# Patient Record
Sex: Male | Born: 1950
Health system: Southern US, Community
[De-identification: ages and names within clinical notes are randomized; demographics above are authoritative.]

## PROBLEM LIST (undated history)

## (undated) DIAGNOSIS — K219 Gastro-esophageal reflux disease without esophagitis: Secondary | ICD-10-CM

## (undated) DIAGNOSIS — E785 Hyperlipidemia, unspecified: Secondary | ICD-10-CM

## (undated) DIAGNOSIS — M1990 Unspecified osteoarthritis, unspecified site: Secondary | ICD-10-CM

## (undated) DIAGNOSIS — Z87442 Personal history of urinary calculi: Secondary | ICD-10-CM

## (undated) DIAGNOSIS — J302 Other seasonal allergic rhinitis: Secondary | ICD-10-CM

## (undated) DIAGNOSIS — Z973 Presence of spectacles and contact lenses: Secondary | ICD-10-CM

## (undated) DIAGNOSIS — I10 Essential (primary) hypertension: Secondary | ICD-10-CM

## (undated) HISTORY — PX: CARPAL TUNNEL RELEASE: SHX101

## (undated) HISTORY — PX: INGUINAL HERNIA REPAIR: SHX194

## (undated) HISTORY — DX: Morbid (severe) obesity due to excess calories: E66.01

## (undated) HISTORY — PX: CYST EXCISION: SHX5701

## (undated) HISTORY — PX: COLONOSCOPY: SHX174

---

## 1998-11-30 HISTORY — PX: NASAL SINUS SURGERY: SHX719

## 2010-02-03 ENCOUNTER — Encounter: Admission: RE | Admit: 2010-02-03 | Discharge: 2010-02-03 | Payer: Self-pay | Admitting: Family Medicine

## 2015-02-05 ENCOUNTER — Encounter (HOSPITAL_BASED_OUTPATIENT_CLINIC_OR_DEPARTMENT_OTHER): Payer: Self-pay | Admitting: *Deleted

## 2015-02-05 NOTE — Progress Notes (Signed)
   02/05/15 1620  OBSTRUCTIVE SLEEP APNEA  Have you ever been diagnosed with sleep apnea through a sleep study? No  Do you snore loudly (loud enough to be heard through closed doors)?  1  Do you often feel tired, fatigued, or sleepy during the daytime? 0  Has anyone observed you stop breathing during your sleep? 0  Do you have, or are you being treated for high blood pressure? 1  BMI more than 35 kg/m2? 1  Age over 63 years old? 1  Neck circumference greater than 40 cm/16 inches? 1  Gender: 1

## 2015-02-05 NOTE — Progress Notes (Signed)
To come in for bmet-ekg-denies sleep apnea To be done mac/local

## 2015-02-07 ENCOUNTER — Other Ambulatory Visit: Payer: Self-pay

## 2015-02-07 ENCOUNTER — Encounter (HOSPITAL_BASED_OUTPATIENT_CLINIC_OR_DEPARTMENT_OTHER)
Admission: RE | Admit: 2015-02-07 | Discharge: 2015-02-07 | Disposition: A | Discharge status: Home / Self Care | Payer: BLUE CROSS/BLUE SHIELD | Source: Ambulatory Visit | Attending: Otolaryngology | Admitting: Otolaryngology

## 2015-02-07 DIAGNOSIS — Z01810 Encounter for preprocedural cardiovascular examination: Secondary | ICD-10-CM | POA: Diagnosis not present

## 2015-02-07 DIAGNOSIS — G4733 Obstructive sleep apnea (adult) (pediatric): Secondary | ICD-10-CM | POA: Diagnosis not present

## 2015-02-07 DIAGNOSIS — Z6841 Body Mass Index (BMI) 40.0 and over, adult: Secondary | ICD-10-CM | POA: Diagnosis not present

## 2015-02-07 DIAGNOSIS — H938X2 Other specified disorders of left ear: Secondary | ICD-10-CM | POA: Diagnosis not present

## 2015-02-07 LAB — BASIC METABOLIC PANEL
ANION GAP: 8 (ref 5–15)
BUN: 22 mg/dL (ref 6–23)
CHLORIDE: 105 mmol/L (ref 96–112)
CO2: 27 mmol/L (ref 19–32)
Calcium: 9.6 mg/dL (ref 8.4–10.5)
Creatinine, Ser: 1.1 mg/dL (ref 0.50–1.35)
GFR calc Af Amer: 81 mL/min — ABNORMAL LOW (ref >90)
GFR, EST NON AFRICAN AMERICAN: 70 mL/min — AB (ref >90)
Glucose, Bld: 95 mg/dL (ref 70–99)
POTASSIUM: 3.9 mmol/L (ref 3.5–5.1)
SODIUM: 140 mmol/L (ref 135–145)

## 2015-02-07 NOTE — Progress Notes (Signed)
Pt here for EKG and bloodwork. Anesthesia consult done by Dr. Al Corpus who says pt is OK for surgery.

## 2015-02-08 ENCOUNTER — Ambulatory Visit: Payer: Self-pay | Admitting: Otolaryngology

## 2015-02-08 NOTE — H&P (Signed)
Assessment  Morbid obesity (278.01) (E66.01). Epidermoid cyst of skin (706.2) (L72.0). Laryngopharyngeal reflux (LPR) (478.79) (J38.7). Discussed  He is interested in having the cyst removed. It is becoming a cosmetic issue as he loses his hair. This can be done under local anesthetic in the outpatient center. We will schedule this.   His postnasal drip and throat symptoms are all reflux-related. He has to cut down dramatically on his caffeine consumption. Was provided. Reason For Visit  Christopher Hanson is here today at the kind request of [name] for consultation and opinion behind his left ear. HPI  Many year history of a growth behind his left ear that has been slowly getting larger. As he loses his hair it is becoming more of a cosmetic issue. He also has chronic sinus drainage manifested by postnasal drip that is all the time but much worse at night. He is on Prilosec daily. He drinks about 4 or 5 cups of coffee each morning and about 12 cups of decaffeinated coffee throughout the day plus a lot of chocolate and diet cola. He had some sort of nasal or sinus surgery in [place] years ago. He denies any nasal discharge or nasal obstruction. Amended Christopher Hanson  M.D.; 02/01/2015 2:17 PM EST. Allergies  No Known Drug Allergies. Current Meds  Toprol XL TB24 (Metoprolol Succinate ER);; RPT Triamterene-HCTZ CAPS;; RPT Fish Oil CAPS;; RPT Omeprazole 20 MG Oral Capsule Delayed Release;; RPT Multivitamins TABS;; RPT Vytorin TABS;; RPT Astepro SOLN (Azelastine HCl);; RPT. [place]  Lithotripsy. Family Hx  Family history of heart attack: Father (V17.3) (Z21.49) Family history of lung cancer: Mother (V16.1) (Z80.1). Personal Hx  No alcohol use No caffeine use Non-smoker (V49.89) (Z78.9) Amended : Christopher Sizer  LPN; 16/08/9603 54:09 AM EST. ROS  12 system ROS was obtained and reviewed on the Health Maintenance form dated today.  Positive responses are shown above.  If the symptom is  not checked, the patient has denied it. Vital Signs   Recorded by [facility] on 24 Jan 2015 09:31 AM BP:128/70,  Height: 71.25 in, Weight: 383 lb 2 oz, BMI: 53.1 kg/m2,  BMI Calculated: 53.06 ,  BSA Calculated: 2.79. Physical Exam  APPEARANCE: Well developed, morbidly obese gentleman, in no acute distress.  Normal affect, in a pleasant mood.  Oriented to time, place and person. COMMUNICATION: Normal voice   HEAD & FACE:  No scars, lesions or masses of head and face, except for a 4 cm cystic subcutaneous mass behind the left ear.  Sinuses nontender to palpation.  Salivary glands without mass or tenderness.  Facial strength symmetric.  No facial lesion, scars, or mass. EYES: EOMI with normal primary gaze alignment. Visual acuity grossly intact.  PERRLA EXTERNAL EAR & NOSE: No scars, lesions or masses  EAC & TYMPANIC MEMBRANE:  EAC shows no obstructing lesions or debris and tympanic membranes are normal bilaterally with good movement to insufflation. GROSS HEARING: Normal   TMJ:  Nontender  INTRANASAL EXAM: No polyps or purulence.  NASOPHARYNX: Normal, without lesions. LIPS, TEETH & GUMS: No lip lesions, normal dentition and normal gums. ORAL CAVITY/OROPHARYNX:  Oral mucosa moist without lesion or asymmetry of the palate, tongue, tonsil or posterior pharynx. NECK:  Supple without adenopathy or mass. THYROID:  Normal with no masses palpable.  NEUROLOGIC:  No gross CN deficits. No nystagmus noted.   LYMPHATIC:  No enlarged nodes palpable. Signature  Electronically signed by : Christopher Hanson  M.D.; 01/24/2015 9:50 AM EST. Electronically signed by :  Christopher Hanson  M.D.; 01/24/2015 10:23 AM EST. Electronically signed by : Christopher Hanson  M.D.; 02/01/2015 2:17 PM EST.

## 2015-02-11 ENCOUNTER — Ambulatory Visit (HOSPITAL_BASED_OUTPATIENT_CLINIC_OR_DEPARTMENT_OTHER)
Admission: RE | Admit: 2015-02-11 | Discharge: 2015-02-11 | Disposition: A | Discharge status: Home / Self Care | Payer: BLUE CROSS/BLUE SHIELD | Source: Ambulatory Visit | Attending: Otolaryngology | Admitting: Otolaryngology

## 2015-02-11 ENCOUNTER — Encounter (HOSPITAL_BASED_OUTPATIENT_CLINIC_OR_DEPARTMENT_OTHER): Payer: Self-pay | Admitting: *Deleted

## 2015-02-11 ENCOUNTER — Ambulatory Visit (HOSPITAL_BASED_OUTPATIENT_CLINIC_OR_DEPARTMENT_OTHER): Payer: BLUE CROSS/BLUE SHIELD | Admitting: Anesthesiology

## 2015-02-11 ENCOUNTER — Encounter (HOSPITAL_BASED_OUTPATIENT_CLINIC_OR_DEPARTMENT_OTHER)
Admission: RE | Disposition: A | Discharge status: Home / Self Care | Payer: Self-pay | Source: Ambulatory Visit | Attending: Otolaryngology

## 2015-02-11 DIAGNOSIS — G4733 Obstructive sleep apnea (adult) (pediatric): Secondary | ICD-10-CM | POA: Insufficient documentation

## 2015-02-11 DIAGNOSIS — H938X2 Other specified disorders of left ear: Secondary | ICD-10-CM | POA: Diagnosis not present

## 2015-02-11 DIAGNOSIS — Z6841 Body Mass Index (BMI) 40.0 and over, adult: Secondary | ICD-10-CM | POA: Insufficient documentation

## 2015-02-11 HISTORY — DX: Essential (primary) hypertension: I10

## 2015-02-11 HISTORY — DX: Gastro-esophageal reflux disease without esophagitis: K21.9

## 2015-02-11 HISTORY — DX: Hyperlipidemia, unspecified: E78.5

## 2015-02-11 HISTORY — PX: EAR CYST EXCISION: SHX22

## 2015-02-11 HISTORY — DX: Other seasonal allergic rhinitis: J30.2

## 2015-02-11 HISTORY — DX: Presence of spectacles and contact lenses: Z97.3

## 2015-02-11 LAB — POCT HEMOGLOBIN-HEMACUE: Hemoglobin: 13.9 g/dL (ref 13.0–17.0)

## 2015-02-11 SURGERY — CYST REMOVAL
Anesthesia: Monitor Anesthesia Care | Site: Head | Laterality: Left

## 2015-02-11 MED ORDER — FENTANYL CITRATE 0.05 MG/ML IJ SOLN: 50.0000 ug | INTRAMUSCULAR | Status: DC | PRN

## 2015-02-11 MED ORDER — LIDOCAINE-EPINEPHRINE 1 %-1:100000 IJ SOLN
INTRAMUSCULAR | Status: DC | PRN
  Administered 2015-02-11 (×2): 4 mL

## 2015-02-11 MED ORDER — PROPOFOL 10 MG/ML IV EMUL
INTRAVENOUS | Status: AC
  Filled 2015-02-11: qty 50

## 2015-02-11 MED ORDER — MIDAZOLAM HCL 5 MG/5ML IJ SOLN
INTRAMUSCULAR | Status: DC | PRN
  Administered 2015-02-11: 1 mg via INTRAVENOUS

## 2015-02-11 MED ORDER — OXYCODONE HCL 5 MG/5ML PO SOLN: 5.0000 mg | Freq: Once | ORAL | Status: DC | PRN

## 2015-02-11 MED ORDER — LIDOCAINE HCL (CARDIAC) 20 MG/ML IV SOLN
INTRAVENOUS | Status: DC | PRN
  Administered 2015-02-11: 60 mg via INTRAVENOUS

## 2015-02-11 MED ORDER — ONDANSETRON HCL 4 MG/2ML IJ SOLN: 4.0000 mg | Freq: Four times a day (QID) | INTRAMUSCULAR | Status: DC | PRN

## 2015-02-11 MED ORDER — BACITRACIN-NEOMYCIN-POLYMYXIN 400-5-5000 EX OINT
TOPICAL_OINTMENT | CUTANEOUS | Status: AC
  Filled 2015-02-11: qty 1

## 2015-02-11 MED ORDER — SUCCINYLCHOLINE CHLORIDE 20 MG/ML IJ SOLN
INTRAMUSCULAR | Status: AC
  Filled 2015-02-11: qty 1

## 2015-02-11 MED ORDER — BACITRACIN ZINC 500 UNIT/GM EX OINT
TOPICAL_OINTMENT | CUTANEOUS | Status: AC
  Filled 2015-02-11: qty 0.9

## 2015-02-11 MED ORDER — MIDAZOLAM HCL 2 MG/2ML IJ SOLN
INTRAMUSCULAR | Status: AC
  Filled 2015-02-11: qty 2

## 2015-02-11 MED ORDER — MIDAZOLAM HCL 2 MG/2ML IJ SOLN: 1.0000 mg | INTRAMUSCULAR | Status: DC | PRN

## 2015-02-11 MED ORDER — FENTANYL CITRATE 0.05 MG/ML IJ SOLN
INTRAMUSCULAR | Status: AC
  Filled 2015-02-11: qty 6

## 2015-02-11 MED ORDER — PROMETHAZINE HCL 25 MG RE SUPP: 25.0000 mg | Freq: Four times a day (QID) | RECTAL | Status: DC | PRN

## 2015-02-11 MED ORDER — LIDOCAINE-EPINEPHRINE 1 %-1:100000 IJ SOLN
INTRAMUSCULAR | Status: AC
  Filled 2015-02-11: qty 1

## 2015-02-11 MED ORDER — CEPHALEXIN 500 MG PO CAPS: 500.0000 mg | ORAL_CAPSULE | Freq: Three times a day (TID) | ORAL | Status: DC

## 2015-02-11 MED ORDER — PROPOFOL INFUSION 10 MG/ML OPTIME
INTRAVENOUS | Status: DC | PRN
  Administered 2015-02-11: 25 ug/kg/min via INTRAVENOUS

## 2015-02-11 MED ORDER — CEFAZOLIN SODIUM 1-5 GM-% IV SOLN
INTRAVENOUS | Status: AC
  Filled 2015-02-11: qty 50

## 2015-02-11 MED ORDER — FENTANYL CITRATE 0.05 MG/ML IJ SOLN: 25.0000 ug | INTRAMUSCULAR | Status: DC | PRN

## 2015-02-11 MED ORDER — CEFAZOLIN SODIUM-DEXTROSE 2-3 GM-% IV SOLR
INTRAVENOUS | Status: AC
  Filled 2015-02-11: qty 50

## 2015-02-11 MED ORDER — LACTATED RINGERS IV SOLN
INTRAVENOUS | Status: DC
  Administered 2015-02-11: 09:00:00 via INTRAVENOUS

## 2015-02-11 MED ORDER — OXYCODONE HCL 5 MG PO TABS: 5.0000 mg | ORAL_TABLET | Freq: Once | ORAL | Status: DC | PRN

## 2015-02-11 MED ORDER — HYDROCODONE-ACETAMINOPHEN 7.5-325 MG PO TABS: 1.0000 | ORAL_TABLET | Freq: Four times a day (QID) | ORAL | Status: DC | PRN

## 2015-02-11 MED ORDER — DEXTROSE 5 % IV SOLN
3.0000 g | INTRAVENOUS | Status: AC
  Administered 2015-02-11: 3 g via INTRAVENOUS

## 2015-02-11 MED ORDER — ONDANSETRON HCL 4 MG/2ML IJ SOLN
INTRAMUSCULAR | Status: DC | PRN
  Administered 2015-02-11: 4 mg via INTRAVENOUS

## 2015-02-11 SURGICAL SUPPLY — 61 items
ATTRACTOMAT 16X20 MAGNETIC DRP (DRAPES) IMPLANT
BENZOIN TINCTURE PRP APPL 2/3 (GAUZE/BANDAGES/DRESSINGS) IMPLANT
BLADE CLIPPER SENSICLIP SURGIC (BLADE) ×3 IMPLANT
BLADE SURG 15 STRL LF DISP TIS (BLADE) ×1 IMPLANT
BLADE SURG 15 STRL SS (BLADE) ×2
CANISTER SUCT 1200ML W/VALVE (MISCELLANEOUS) ×3 IMPLANT
CLEANER CAUTERY TIP 5X5 PAD (MISCELLANEOUS) ×1 IMPLANT
CLIP TI MEDIUM 6 (CLIP) IMPLANT
CLIP TI WIDE RED SMALL 6 (CLIP) IMPLANT
CLOSURE WOUND 1/2 X4 (GAUZE/BANDAGES/DRESSINGS)
CORDS BIPOLAR (ELECTRODE) IMPLANT
COVER BACK TABLE 60X90IN (DRAPES) ×3 IMPLANT
COVER MAYO STAND STRL (DRAPES) ×3 IMPLANT
DRAIN JACKSON RD 7FR 3/32 (WOUND CARE) IMPLANT
DRAIN PENROSE 1/4X12 LTX STRL (WOUND CARE) IMPLANT
DRAPE U-SHAPE 76X120 STRL (DRAPES) ×3 IMPLANT
ELECT COATED BLADE 2.86 ST (ELECTRODE) ×3 IMPLANT
ELECT REM PT RETURN 9FT ADLT (ELECTROSURGICAL) ×3
ELECTRODE REM PT RTRN 9FT ADLT (ELECTROSURGICAL) ×1 IMPLANT
EVACUATOR SILICONE 100CC (DRAIN) IMPLANT
GAUZE SPONGE 4X4 16PLY XRAY LF (GAUZE/BANDAGES/DRESSINGS) IMPLANT
GLOVE BIOGEL M 7.0 STRL (GLOVE) ×3 IMPLANT
GLOVE BIOGEL PI IND STRL 7.0 (GLOVE) ×2 IMPLANT
GLOVE BIOGEL PI IND STRL 7.5 (GLOVE) ×1 IMPLANT
GLOVE BIOGEL PI INDICATOR 7.0 (GLOVE) ×4
GLOVE BIOGEL PI INDICATOR 7.5 (GLOVE) ×2
GLOVE ECLIPSE 6.5 STRL STRAW (GLOVE) ×3 IMPLANT
GLOVE ECLIPSE 7.5 STRL STRAW (GLOVE) ×3 IMPLANT
GOWN STRL REUS W/ TWL LRG LVL3 (GOWN DISPOSABLE) ×2 IMPLANT
GOWN STRL REUS W/ TWL XL LVL3 (GOWN DISPOSABLE) ×1 IMPLANT
GOWN STRL REUS W/TWL LRG LVL3 (GOWN DISPOSABLE) ×4
GOWN STRL REUS W/TWL XL LVL3 (GOWN DISPOSABLE) ×2
LIQUID BAND (GAUZE/BANDAGES/DRESSINGS) ×3 IMPLANT
NEEDLE HYPO 25X1 1.5 SAFETY (NEEDLE) ×3 IMPLANT
NEEDLE PRECISIONGLIDE 27X1.5 (NEEDLE) ×6 IMPLANT
NS IRRIG 1000ML POUR BTL (IV SOLUTION) ×3 IMPLANT
PACK BASIN DAY SURGERY FS (CUSTOM PROCEDURE TRAY) ×3 IMPLANT
PAD CLEANER CAUTERY TIP 5X5 (MISCELLANEOUS) ×2
PENCIL FOOT CONTROL (ELECTRODE) ×3 IMPLANT
RUBBERBAND STERILE (MISCELLANEOUS) IMPLANT
SHEET MEDIUM DRAPE 40X70 STRL (DRAPES) IMPLANT
SPONGE GAUZE 2X2 8PLY STER LF (GAUZE/BANDAGES/DRESSINGS)
SPONGE GAUZE 2X2 8PLY STRL LF (GAUZE/BANDAGES/DRESSINGS) IMPLANT
SPONGE GAUZE 4X4 12PLY STER LF (GAUZE/BANDAGES/DRESSINGS) IMPLANT
STAPLER VISISTAT 35W (STAPLE) IMPLANT
STRIP CLOSURE SKIN 1/2X4 (GAUZE/BANDAGES/DRESSINGS) IMPLANT
SUCTION FRAZIER TIP 10 FR DISP (SUCTIONS) IMPLANT
SUT CHROMIC 3 0 PS 2 (SUTURE) ×6 IMPLANT
SUT CHROMIC 4 0 P 3 18 (SUTURE) IMPLANT
SUT ETHILON 4 0 PS 2 18 (SUTURE) IMPLANT
SUT ETHILON 5 0 P 3 18 (SUTURE)
SUT NYLON ETHILON 5-0 P-3 1X18 (SUTURE) IMPLANT
SUT PLAIN 5 0 P 3 18 (SUTURE) IMPLANT
SUT SILK 4 0 TIES 17X18 (SUTURE) ×3 IMPLANT
SUT SURG 6 0 PRE1/P 10 (SUTURE) IMPLANT
SUT VICRYL 4-0 PS2 18IN ABS (SUTURE) IMPLANT
SYR BULB 3OZ (MISCELLANEOUS) ×3 IMPLANT
SYR CONTROL 10ML LL (SYRINGE) ×6 IMPLANT
TRAY DSU PREP LF (CUSTOM PROCEDURE TRAY) ×6 IMPLANT
TUBE CONNECTING 20'X1/4 (TUBING) ×1
TUBE CONNECTING 20X1/4 (TUBING) ×2 IMPLANT

## 2015-02-11 NOTE — Transfer of Care (Signed)
Immediate Anesthesia Transfer of Care Note  Patient: Christopher Hanson  Procedure(s) Performed: Procedure(s): EXCISION OF LEFT POST AURICULAR CYST (Left)  Patient Location: PACU  Anesthesia Type:MAC  Level of Consciousness: awake, alert , oriented and patient cooperative  Airway & Oxygen Therapy: Patient Spontanous Breathing and Patient connected to face mask oxygen  Post-op Assessment: Report given to RN and Post -op Vital signs reviewed and stable  Post vital signs: Reviewed and stable  Last Vitals:  Filed Vitals:   02/11/15 0912  BP: 134/74  Pulse: 72  Temp: 36.9 C  Resp: 20    Complications: No apparent anesthesia complications

## 2015-02-11 NOTE — H&P (View-Only) (Signed)
Assessment  Morbid obesity (278.01) (E66.01). Epidermoid cyst of skin (706.2) (L72.0). Laryngopharyngeal reflux (LPR) (478.79) (J38.7). Discussed  He is interested in having the cyst removed. It is becoming a cosmetic issue as he loses his hair. This can be done under local anesthetic in the outpatient center. We will schedule this.   His postnasal drip and throat symptoms are all reflux-related. He has to cut down dramatically on his caffeine consumption. Was provided. Reason For Visit  Christopher Hanson is here today at the kind request of [name] for consultation and opinion behind his left ear. HPI  Many year history of a growth behind his left ear that has been slowly getting larger. As he loses his hair it is becoming more of a cosmetic issue. He also has chronic sinus drainage manifested by postnasal drip that is all the time but much worse at night. He is on Prilosec daily. He drinks about 4 or 5 cups of coffee each morning and about 12 cups of decaffeinated coffee throughout the day plus a lot of chocolate and diet cola. He had some sort of nasal or sinus surgery in [place] years ago. He denies any nasal discharge or nasal obstruction. Amended [name]  M.D.; 02/01/2015 2:17 PM EST. Allergies  No Known Drug Allergies. Current Meds  Toprol XL TB24 (Metoprolol Succinate ER);; RPT Triamterene-HCTZ CAPS;; RPT Fish Oil CAPS;; RPT Omeprazole 20 MG Oral Capsule Delayed Release;; RPT Multivitamins TABS;; RPT Vytorin TABS;; RPT Astepro SOLN (Azelastine HCl);; RPT. [place]  Lithotripsy. Family Hx  Family history of heart attack: Father (V17.3) (Z46.49) Family history of lung cancer: Mother (V16.1) (Z80.1). Personal Hx  No alcohol use No caffeine use Non-smoker (V49.89) (Z78.9) Amended : [place] Sizer  LPN; 79/01/4096 35:32 AM EST. ROS  12 system ROS was obtained and reviewed on the Health Maintenance form dated today.  Positive responses are shown above.  If the symptom is  not checked, the patient has denied it. Vital Signs   Recorded by [facility] on 24 Jan 2015 09:31 AM BP:128/70,  Height: 71.25 in, Weight: 383 lb 2 oz, BMI: 53.1 kg/m2,  BMI Calculated: 53.06 ,  BSA Calculated: 2.79. Physical Exam  APPEARANCE: Well developed, morbidly obese gentleman, in no acute distress.  Normal affect, in a pleasant mood.  Oriented to time, place and person. COMMUNICATION: Normal voice   HEAD & FACE:  No scars, lesions or masses of head and face, except for a 4 cm cystic subcutaneous mass behind the left ear.  Sinuses nontender to palpation.  Salivary glands without mass or tenderness.  Facial strength symmetric.  No facial lesion, scars, or mass. EYES: EOMI with normal primary gaze alignment. Visual acuity grossly intact.  PERRLA EXTERNAL EAR & NOSE: No scars, lesions or masses  EAC & TYMPANIC MEMBRANE:  EAC shows no obstructing lesions or debris and tympanic membranes are normal bilaterally with good movement to insufflation. GROSS HEARING: Normal   TMJ:  Nontender  INTRANASAL EXAM: No polyps or purulence.  NASOPHARYNX: Normal, without lesions. LIPS, TEETH & GUMS: No lip lesions, normal dentition and normal gums. ORAL CAVITY/OROPHARYNX:  Oral mucosa moist without lesion or asymmetry of the palate, tongue, tonsil or posterior pharynx. NECK:  Supple without adenopathy or mass. THYROID:  Normal with no masses palpable.  NEUROLOGIC:  No gross CN deficits. No nystagmus noted.   LYMPHATIC:  No enlarged nodes palpable. Signature  Electronically signed by : [name]  M.D.; 01/24/2015 9:50 AM EST. Electronically signed by :  [name]  M.D.; 01/24/2015 10:23 AM EST. Electronically signed by : [name]  M.D.; 02/01/2015 2:17 PM EST.

## 2015-02-11 NOTE — Anesthesia Preprocedure Evaluation (Signed)
Anesthesia Evaluation  Patient identified by MRN, date of birth, ID band Patient awake    Reviewed: Allergy & Precautions, NPO status , Patient's Chart, lab work & pertinent test results  Airway Mallampati: III   Neck ROM: full    Dental   Pulmonary neg pulmonary ROS,          Cardiovascular hypertension,     Neuro/Psych    GI/Hepatic GERD-  ,  Endo/Other  Morbid obesity  Renal/GU      Musculoskeletal   Abdominal   Peds  Hematology   Anesthesia Other Findings   Reproductive/Obstetrics                             Anesthesia Physical Anesthesia Plan  ASA: II  Anesthesia Plan: MAC   Post-op Pain Management:    Induction: Intravenous  Airway Management Planned: Nasal Cannula  Additional Equipment:   Intra-op Plan:   Post-operative Plan:   Informed Consent: I have reviewed the patients History and Physical, chart, labs and discussed the procedure including the risks, benefits and alternatives for the proposed anesthesia with the patient or authorized representative who has indicated his/her understanding and acceptance.     Plan Discussed with: CRNA, Anesthesiologist and Surgeon  Anesthesia Plan Comments:         Anesthesia Quick Evaluation

## 2015-02-11 NOTE — Op Note (Signed)
OPERATIVE REPORT  DATE OF SURGERY: 02/11/2015  PATIENT:  Christopher Hanson,  64 y.o. male  PRE-OPERATIVE DIAGNOSIS:  LEFT POST AURICULAR Mass  POST-OPERATIVE DIAGNOSIS:  LEFT POST AURICULAR Mass  PROCEDURE:  Procedure(s): EXCISION OF LEFT POST AURICULAR Mass  SURGEON:  Beckie Salts, MD  ASSISTANTS: none  ANESTHESIA:   local  EBL:  50 ml  DRAINS: none  LOCAL MEDICATIONS USED:  1% Xylocaine with epinephrine  SPECIMEN:  Left postauricular mass  COUNTS:  Correct  PROCEDURE DETAILS: The patient was taken to the operating room and placed on the operating table in the supine position. Following intravenous sedation the left postauricular area was prepped and draped in a standard fashion. An incision was outlined with a marking pen and 1% Xylocaine with epinephrine was infiltrated into the incision and surrounding tissue. Couple was used to excise an ellipse of skin overlying the mass. Careful sharp and blunt dissection as well as electrocautery dissection was used to dissected out the entire mass. Additional local was used as needed. The lesion was adherent to the pericranium. The entire lesion was removed. Cautery was used for completion of hemostasis. The skin was reapproximated using interrupted 3-0 chromic on the deep layer and to tack down the skin to the pericranium and a running 3-0 chromic subcuticular closure with Dermabond on the skin. The specimen was divided on the back table and was consistent with a fibrous lipoma and was sent for pathologic evaluation. The patient was transferred to recovery in stable condition.    PATIENT DISPOSITION:  To PACU, stable

## 2015-02-11 NOTE — Anesthesia Procedure Notes (Addendum)
Procedure Name: MAC Date/Time: 02/11/2015 10:25 AM Performed by: Hero Kulish D Pre-anesthesia Checklist: Patient identified, Emergency Drugs available, Suction available, Patient being monitored and Timeout performed Patient Re-evaluated:Patient Re-evaluated prior to inductionComments: Oxygen given only during local application, discontinued prior to draping and during procedure    Performed by: Westlynn Fifer D

## 2015-02-11 NOTE — Anesthesia Postprocedure Evaluation (Signed)
Anesthesia Post Note  Patient: Christopher Hanson  Procedure(s) Performed: Procedure(s) (LRB): EXCISION OF LEFT POST AURICULAR CYST (Left)  Anesthesia type: MAC  Patient location: PACU  Post pain: Pain level controlled and Adequate analgesia  Post assessment: Post-op Vital signs reviewed, Patient's Cardiovascular Status Stable and Respiratory Function Stable  Last Vitals:  Filed Vitals:   02/11/15 1230  BP: 122/65  Pulse: 73  Temp:   Resp: 17    Post vital signs: Reviewed and stable  Level of consciousness: awake, alert  and oriented  Complications: No apparent anesthesia complications

## 2015-02-11 NOTE — Interval H&P Note (Signed)
History and Physical Interval Note:  02/11/2015 10:16 AM  Christopher Hanson  has presented today for surgery, with the diagnosis of LEFT POST AURICULAR  The various methods of treatment have been discussed with the patient and family. After consideration of risks, benefits and other options for treatment, the patient has consented to  Procedure(s): EXCISION OF LEFT POST AURICULAR CYST (Left) as a surgical intervention .  The patient's history has been reviewed, patient examined, no change in status, stable for surgery.  I have reviewed the patient's chart and labs.  Questions were answered to the patient's satisfaction.     Teodor Prater

## 2015-02-11 NOTE — Discharge Instructions (Signed)
It is okay to get the surgical site wet and to use soap but do not use any creams, oils or ointments.    Post Anesthesia Home Care Instructions  Activity: Get plenty of rest for the remainder of the day. A responsible adult should stay with you for 24 hours following the procedure.  For the next 24 hours, DO NOT: -Drive a car -Paediatric nurse -Drink alcoholic beverages -Take any medication unless instructed by your physician -Make any legal decisions or sign important papers.  Meals: Start with liquid foods such as gelatin or soup. Progress to regular foods as tolerated. Avoid greasy, spicy, heavy foods. If nausea and/or vomiting occur, drink only clear liquids until the nausea and/or vomiting subsides. Call your physician if vomiting continues.  Special Instructions/Symptoms: Your throat may feel dry or sore from the anesthesia or the breathing tube placed in your throat during surgery. If this causes discomfort, gargle with warm salt water. The discomfort should disappear within 24 hours.

## 2015-02-12 ENCOUNTER — Encounter (HOSPITAL_BASED_OUTPATIENT_CLINIC_OR_DEPARTMENT_OTHER): Payer: Self-pay | Admitting: Otolaryngology

## 2017-12-29 DIAGNOSIS — I10 Essential (primary) hypertension: Secondary | ICD-10-CM | POA: Diagnosis not present

## 2017-12-29 DIAGNOSIS — Z23 Encounter for immunization: Secondary | ICD-10-CM | POA: Diagnosis not present

## 2017-12-29 DIAGNOSIS — Z1389 Encounter for screening for other disorder: Secondary | ICD-10-CM | POA: Diagnosis not present

## 2017-12-29 DIAGNOSIS — Z125 Encounter for screening for malignant neoplasm of prostate: Secondary | ICD-10-CM | POA: Diagnosis not present

## 2017-12-29 DIAGNOSIS — N400 Enlarged prostate without lower urinary tract symptoms: Secondary | ICD-10-CM | POA: Diagnosis not present

## 2017-12-29 DIAGNOSIS — E7800 Pure hypercholesterolemia, unspecified: Secondary | ICD-10-CM | POA: Diagnosis not present

## 2017-12-29 DIAGNOSIS — Z0000 Encounter for general adult medical examination without abnormal findings: Secondary | ICD-10-CM | POA: Diagnosis not present

## 2018-01-28 DIAGNOSIS — I480 Paroxysmal atrial fibrillation: Secondary | ICD-10-CM

## 2018-01-28 HISTORY — DX: Paroxysmal atrial fibrillation: I48.0

## 2018-03-09 DIAGNOSIS — D1801 Hemangioma of skin and subcutaneous tissue: Secondary | ICD-10-CM | POA: Diagnosis not present

## 2018-03-09 DIAGNOSIS — L570 Actinic keratosis: Secondary | ICD-10-CM | POA: Diagnosis not present

## 2018-05-23 DIAGNOSIS — H40053 Ocular hypertension, bilateral: Secondary | ICD-10-CM | POA: Diagnosis not present

## 2018-05-23 DIAGNOSIS — H2513 Age-related nuclear cataract, bilateral: Secondary | ICD-10-CM | POA: Diagnosis not present

## 2018-05-23 DIAGNOSIS — H524 Presbyopia: Secondary | ICD-10-CM | POA: Diagnosis not present

## 2018-06-27 DIAGNOSIS — T24231A Burn of second degree of right lower leg, initial encounter: Secondary | ICD-10-CM | POA: Diagnosis not present

## 2018-06-30 DIAGNOSIS — K219 Gastro-esophageal reflux disease without esophagitis: Secondary | ICD-10-CM | POA: Diagnosis not present

## 2018-06-30 DIAGNOSIS — N400 Enlarged prostate without lower urinary tract symptoms: Secondary | ICD-10-CM | POA: Diagnosis not present

## 2018-06-30 DIAGNOSIS — I10 Essential (primary) hypertension: Secondary | ICD-10-CM | POA: Diagnosis not present

## 2018-06-30 DIAGNOSIS — E7800 Pure hypercholesterolemia, unspecified: Secondary | ICD-10-CM | POA: Diagnosis not present

## 2018-06-30 DIAGNOSIS — E669 Obesity, unspecified: Secondary | ICD-10-CM | POA: Diagnosis not present

## 2018-09-28 DIAGNOSIS — Z23 Encounter for immunization: Secondary | ICD-10-CM | POA: Diagnosis not present

## 2018-10-15 DIAGNOSIS — R05 Cough: Secondary | ICD-10-CM | POA: Diagnosis not present

## 2018-10-15 DIAGNOSIS — J069 Acute upper respiratory infection, unspecified: Secondary | ICD-10-CM | POA: Diagnosis not present

## 2018-10-19 DIAGNOSIS — N5201 Erectile dysfunction due to arterial insufficiency: Secondary | ICD-10-CM | POA: Diagnosis not present

## 2018-10-19 DIAGNOSIS — R3912 Poor urinary stream: Secondary | ICD-10-CM | POA: Diagnosis not present

## 2018-10-19 DIAGNOSIS — N401 Enlarged prostate with lower urinary tract symptoms: Secondary | ICD-10-CM | POA: Diagnosis not present

## 2018-10-19 DIAGNOSIS — R351 Nocturia: Secondary | ICD-10-CM | POA: Diagnosis not present

## 2019-01-17 DIAGNOSIS — Z0000 Encounter for general adult medical examination without abnormal findings: Secondary | ICD-10-CM | POA: Diagnosis not present

## 2019-01-17 DIAGNOSIS — Z125 Encounter for screening for malignant neoplasm of prostate: Secondary | ICD-10-CM | POA: Diagnosis not present

## 2019-01-17 DIAGNOSIS — I10 Essential (primary) hypertension: Secondary | ICD-10-CM | POA: Diagnosis not present

## 2019-01-17 DIAGNOSIS — Z1389 Encounter for screening for other disorder: Secondary | ICD-10-CM | POA: Diagnosis not present

## 2019-01-17 DIAGNOSIS — E669 Obesity, unspecified: Secondary | ICD-10-CM | POA: Diagnosis not present

## 2019-01-17 DIAGNOSIS — E7800 Pure hypercholesterolemia, unspecified: Secondary | ICD-10-CM | POA: Diagnosis not present

## 2019-01-17 DIAGNOSIS — K219 Gastro-esophageal reflux disease without esophagitis: Secondary | ICD-10-CM | POA: Diagnosis not present

## 2019-01-17 DIAGNOSIS — Z6841 Body Mass Index (BMI) 40.0 and over, adult: Secondary | ICD-10-CM | POA: Diagnosis not present

## 2019-07-18 DIAGNOSIS — I10 Essential (primary) hypertension: Secondary | ICD-10-CM | POA: Diagnosis not present

## 2019-07-18 DIAGNOSIS — E7800 Pure hypercholesterolemia, unspecified: Secondary | ICD-10-CM | POA: Diagnosis not present

## 2019-07-18 DIAGNOSIS — K219 Gastro-esophageal reflux disease without esophagitis: Secondary | ICD-10-CM | POA: Diagnosis not present

## 2019-07-31 DIAGNOSIS — I10 Essential (primary) hypertension: Secondary | ICD-10-CM | POA: Diagnosis not present

## 2019-08-17 DIAGNOSIS — Z23 Encounter for immunization: Secondary | ICD-10-CM | POA: Diagnosis not present

## 2019-08-28 DIAGNOSIS — Z1159 Encounter for screening for other viral diseases: Secondary | ICD-10-CM | POA: Diagnosis not present

## 2019-08-31 DIAGNOSIS — K5730 Diverticulosis of large intestine without perforation or abscess without bleeding: Secondary | ICD-10-CM | POA: Diagnosis not present

## 2019-08-31 DIAGNOSIS — Z86010 Personal history of colonic polyps: Secondary | ICD-10-CM | POA: Diagnosis not present

## 2019-09-24 DIAGNOSIS — L03115 Cellulitis of right lower limb: Secondary | ICD-10-CM | POA: Diagnosis not present

## 2019-09-27 DIAGNOSIS — N400 Enlarged prostate without lower urinary tract symptoms: Secondary | ICD-10-CM | POA: Diagnosis not present

## 2019-09-27 DIAGNOSIS — N401 Enlarged prostate with lower urinary tract symptoms: Secondary | ICD-10-CM | POA: Diagnosis not present

## 2019-09-27 DIAGNOSIS — I10 Essential (primary) hypertension: Secondary | ICD-10-CM | POA: Diagnosis not present

## 2019-09-27 DIAGNOSIS — E7800 Pure hypercholesterolemia, unspecified: Secondary | ICD-10-CM | POA: Diagnosis not present

## 2019-10-06 DIAGNOSIS — W57XXXD Bitten or stung by nonvenomous insect and other nonvenomous arthropods, subsequent encounter: Secondary | ICD-10-CM | POA: Diagnosis not present

## 2019-10-06 DIAGNOSIS — R509 Fever, unspecified: Secondary | ICD-10-CM | POA: Diagnosis not present

## 2019-10-08 DIAGNOSIS — L03211 Cellulitis of face: Secondary | ICD-10-CM | POA: Diagnosis not present

## 2019-10-08 DIAGNOSIS — H6011 Cellulitis of right external ear: Secondary | ICD-10-CM | POA: Diagnosis not present

## 2019-10-17 DIAGNOSIS — Z125 Encounter for screening for malignant neoplasm of prostate: Secondary | ICD-10-CM | POA: Diagnosis not present

## 2019-10-25 DIAGNOSIS — R3912 Poor urinary stream: Secondary | ICD-10-CM | POA: Diagnosis not present

## 2019-10-25 DIAGNOSIS — R351 Nocturia: Secondary | ICD-10-CM | POA: Diagnosis not present

## 2019-10-25 DIAGNOSIS — N5201 Erectile dysfunction due to arterial insufficiency: Secondary | ICD-10-CM | POA: Diagnosis not present

## 2019-10-25 DIAGNOSIS — N401 Enlarged prostate with lower urinary tract symptoms: Secondary | ICD-10-CM | POA: Diagnosis not present

## 2019-10-30 DIAGNOSIS — E7800 Pure hypercholesterolemia, unspecified: Secondary | ICD-10-CM | POA: Diagnosis not present

## 2019-10-30 DIAGNOSIS — N400 Enlarged prostate without lower urinary tract symptoms: Secondary | ICD-10-CM | POA: Diagnosis not present

## 2019-10-30 DIAGNOSIS — I10 Essential (primary) hypertension: Secondary | ICD-10-CM | POA: Diagnosis not present

## 2019-10-30 DIAGNOSIS — N401 Enlarged prostate with lower urinary tract symptoms: Secondary | ICD-10-CM | POA: Diagnosis not present

## 2019-12-06 DIAGNOSIS — N400 Enlarged prostate without lower urinary tract symptoms: Secondary | ICD-10-CM | POA: Diagnosis not present

## 2019-12-06 DIAGNOSIS — I10 Essential (primary) hypertension: Secondary | ICD-10-CM | POA: Diagnosis not present

## 2019-12-06 DIAGNOSIS — E7800 Pure hypercholesterolemia, unspecified: Secondary | ICD-10-CM | POA: Diagnosis not present

## 2019-12-06 DIAGNOSIS — N401 Enlarged prostate with lower urinary tract symptoms: Secondary | ICD-10-CM | POA: Diagnosis not present

## 2020-01-12 DIAGNOSIS — Z23 Encounter for immunization: Secondary | ICD-10-CM | POA: Diagnosis not present

## 2020-02-09 DIAGNOSIS — Z23 Encounter for immunization: Secondary | ICD-10-CM | POA: Diagnosis not present

## 2020-02-12 DIAGNOSIS — I480 Paroxysmal atrial fibrillation: Secondary | ICD-10-CM

## 2020-02-15 DIAGNOSIS — Z0000 Encounter for general adult medical examination without abnormal findings: Secondary | ICD-10-CM | POA: Diagnosis not present

## 2020-02-15 DIAGNOSIS — I4891 Unspecified atrial fibrillation: Secondary | ICD-10-CM | POA: Diagnosis not present

## 2020-02-15 DIAGNOSIS — K219 Gastro-esophageal reflux disease without esophagitis: Secondary | ICD-10-CM | POA: Diagnosis not present

## 2020-02-15 DIAGNOSIS — Z6841 Body Mass Index (BMI) 40.0 and over, adult: Secondary | ICD-10-CM | POA: Diagnosis not present

## 2020-02-15 DIAGNOSIS — N401 Enlarged prostate with lower urinary tract symptoms: Secondary | ICD-10-CM | POA: Diagnosis not present

## 2020-02-15 DIAGNOSIS — E7800 Pure hypercholesterolemia, unspecified: Secondary | ICD-10-CM | POA: Diagnosis not present

## 2020-02-15 DIAGNOSIS — I10 Essential (primary) hypertension: Secondary | ICD-10-CM | POA: Diagnosis not present

## 2020-02-15 DIAGNOSIS — Z1389 Encounter for screening for other disorder: Secondary | ICD-10-CM | POA: Diagnosis not present

## 2020-02-15 DIAGNOSIS — I499 Cardiac arrhythmia, unspecified: Secondary | ICD-10-CM | POA: Diagnosis not present

## 2020-02-16 ENCOUNTER — Other Ambulatory Visit: Payer: Self-pay

## 2020-02-16 ENCOUNTER — Encounter (HOSPITAL_COMMUNITY): Payer: Self-pay | Admitting: Nurse Practitioner

## 2020-02-16 ENCOUNTER — Ambulatory Visit (HOSPITAL_COMMUNITY)
Admission: RE | Admit: 2020-02-16 | Discharge: 2020-02-16 | Disposition: A | Discharge status: Home / Self Care | Payer: Medicare Other | Source: Ambulatory Visit | Attending: Nurse Practitioner | Admitting: Nurse Practitioner

## 2020-02-16 VITALS — BP 148/90 | HR 68 | Ht 71.0 in | Wt 370.2 lb

## 2020-02-16 DIAGNOSIS — Z79899 Other long term (current) drug therapy: Secondary | ICD-10-CM | POA: Diagnosis not present

## 2020-02-16 DIAGNOSIS — N400 Enlarged prostate without lower urinary tract symptoms: Secondary | ICD-10-CM | POA: Diagnosis not present

## 2020-02-16 DIAGNOSIS — I4891 Unspecified atrial fibrillation: Secondary | ICD-10-CM | POA: Diagnosis not present

## 2020-02-16 DIAGNOSIS — E669 Obesity, unspecified: Secondary | ICD-10-CM | POA: Diagnosis not present

## 2020-02-16 DIAGNOSIS — I10 Essential (primary) hypertension: Secondary | ICD-10-CM | POA: Diagnosis not present

## 2020-02-16 DIAGNOSIS — Z6841 Body Mass Index (BMI) 40.0 and over, adult: Secondary | ICD-10-CM | POA: Diagnosis not present

## 2020-02-16 DIAGNOSIS — G4730 Sleep apnea, unspecified: Secondary | ICD-10-CM | POA: Diagnosis not present

## 2020-02-16 DIAGNOSIS — K219 Gastro-esophageal reflux disease without esophagitis: Secondary | ICD-10-CM | POA: Insufficient documentation

## 2020-02-16 DIAGNOSIS — E785 Hyperlipidemia, unspecified: Secondary | ICD-10-CM | POA: Diagnosis not present

## 2020-02-16 DIAGNOSIS — Z7901 Long term (current) use of anticoagulants: Secondary | ICD-10-CM | POA: Insufficient documentation

## 2020-02-16 MED ORDER — RIVAROXABAN 20 MG PO TABS: 20.0000 mg | ORAL_TABLET | Freq: Every day | ORAL | 3 refills | Status: DC

## 2020-02-16 NOTE — Progress Notes (Addendum)
Primary Care Physician: [name], MD Referring Physician:Dr. Cynda Acres, MD   Christopher Hanson is a 69 y.o. male with a h/o HTN, BPH, sleep apnea, obesity,GERD, hyperlipidemia that was found to be in afib, rate at 103 and asymptomatic, at routine PCP visit yesterday.[name] He was already on metoprolol and this dose was doubled yesterday. Today HR in the 60's in afib .  He was also started on anticoagulation, Xarelto 20 mg daily, with a CHA2DS2VASc score of 2. Denies  bleeding history. He has had both covid shots, last one 1-2 weeks ago.   He drinks moderate caffeine, does not use tobacco, minimal alcohol. Wife states significant snoring with apnea. Has not had a sleep study in the past. He had labs drawn at PCP and heard last night labs were WNL. He has a BMI of 51.63, weight of 370 lbs.  He was going to gym regularly before covid, got a rowing machine afterward, hurt his back and is now not doing any regular exercise. He has gained back around 36 lbs.  Today, he denies symptoms of palpitations, chest pain, shortness of breath, orthopnea, PND, lower extremity edema, dizziness, presyncope, syncope, or neurologic sequela. The patient is tolerating medications without difficulties and is otherwise without complaint today.   Past Medical History:  Diagnosis Date  . GERD (gastroesophageal reflux disease)   . Hyperlipemia   . Hypertension   . Seasonal allergies   . Wears glasses    Past Surgical History:  Procedure Laterality Date  . CARPAL TUNNEL RELEASE     right and left  . COLONOSCOPY    . CYST EXCISION     face  . EAR CYST EXCISION Left 02/11/2015   Procedure: EXCISION OF LEFT POST AURICULAR CYST;  Surgeon: [name], MD;  Location: [facility];  Service: ENT;  Laterality: Left;  . INGUINAL HERNIA REPAIR     bilat-as infant  . NASAL SINUS SURGERY  2000    Current Outpatient Medications  Medication Sig Dispense Refill  . azelastine (ASTELIN) 0.1 % nasal spray  Place 1 spray into both nostrils as needed. Use in each nostril as directed     . B Complex-C (B-COMPLEX WITH VITAMIN C) tablet Take 1 tablet by mouth daily. Vitamin B3 with calcium and vitamin d    . ezetimibe (ZETIA) 10 MG tablet Take 10 mg by mouth daily.    . metoprolol succinate (TOPROL-XL) 100 MG 24 hr tablet Take 100 mg by mouth daily.    . metoprolol succinate (TOPROL-XL) 50 MG 24 hr tablet Take 50 mg by mouth daily. Take with or immediately following a meal.    . Multiple Vitamins-Minerals (MULTIVITAMIN WITH MINERALS) tablet Take 1 tablet by mouth daily.    . Omega-3 Fatty Acids (FISH OIL) 1000 MG CAPS Taking two tablets by mouth daily    . omeprazole (PRILOSEC) 20 MG capsule Take 20 mg by mouth daily.    . simvastatin (ZOCOR) 40 MG tablet Take 40 mg by mouth at bedtime.    . tamsulosin (FLOMAX) 0.4 MG CAPS capsule Take 0.4 mg by mouth every 12 (twelve) hours.    . triamterene-hydrochlorothiazide (DYAZIDE) 37.5-25 MG per capsule Take 1 capsule by mouth daily.     No current facility-administered medications for this encounter.    No Known Allergies  Social History   Socioeconomic History  . Marital status: Married    Spouse name: Not on file  . Number of children: Not on file  . Years  of education: Not on file  . Highest education level: Not on file  Occupational History  . Not on file  Tobacco Use  . Smoking status: Never Smoker  . Smokeless tobacco: Never Used  Substance and Sexual Activity  . Alcohol use: Yes    Comment: Rarely- one drink ,2 glasses occasional  . Drug use: No  . Sexual activity: Not on file  Other Topics Concern  . Not on file  Social History Narrative  . Not on file   Social Determinants of Health   Financial Resource Strain:   . Difficulty of Paying Living Expenses:   Food Insecurity:   . Worried About Charity fundraiser in the Last Year:   . Arboriculturist in the Last Year:   Transportation Needs:   . Film/video editor (Medical):    [name] Lack of Transportation (Non-Medical):   Physical Activity:   . Days of Exercise per Week:   . Minutes of Exercise per Session:   Stress:   . Feeling of Stress :   Social Connections:   . Frequency of Communication with Friends and Family:   . Frequency of Social Gatherings with Friends and Family:   . Attends Religious Services:   . Active Member of Clubs or Organizations:   . Attends Archivist Meetings:   [name] Marital Status:   Intimate Partner Violence:   . Fear of Current or Ex-Partner:   . Emotionally Abused:   [name] Physically Abused:   . Sexually Abused:     History reviewed. No pertinent family history.  ROS- All systems are reviewed and negative except as per the HPI above  Physical Exam: Vitals:   02/16/20 0848  Height: 5\' 11"  (1.803 m)   Wt Readings from Last 3 Encounters:  02/11/15 (!) 174.2 kg    Labs: Lab Results  Component Value Date   NA 140 02/07/2015   K 3.9 02/07/2015   CL 105 02/07/2015   CO2 27 02/07/2015   GLUCOSE 95 02/07/2015   BUN 22 02/07/2015   CREATININE 1.10 02/07/2015   CALCIUM 9.6 02/07/2015   No results found for: INR No results found for: CHOL, HDL, LDLCALC, TRIG   GEN- The patient is well appearing, alert and oriented x 3 today.   Head- normocephalic, atraumatic Eyes-  Sclera clear, conjunctiva pink Ears- hearing intact Oropharynx- clear Neck- supple, no JVP Lymph- no cervical lymphadenopathy Lungs- Clear to ausculation bilaterally, normal work of breathing Heart- irregular rate and rhythm, no murmurs, rubs or gallops, PMI not laterally displaced GI- soft, NT, ND, + BS Extremities- no clubbing, cyanosis, or edema MS- no significant deformity or atrophy Skin- no rash or lesion Psych- euthymic mood, full affect Neuro- strength and sensation are intact  EKG-reviewed from  PCP office yesterday showing afib with v rate of 103 bpm, today, afib at 68 bpm, qrs int 96 ms, qtc 412 ms Other records from PCP  reviewed    Assessment and Plan: 1. New onset afib General education re afib  Asymptomatic  Now rate controlled Continue metoprolol succinate 100 mg daily Echo   2.CHA2DS2VASc score of 2( age/htn) Started on xarelto 20 mg daily yesterday with Dr. Tamala Julian Bleeding precautions discussed  Reminded not to miss any doses  Will plan for cardioversion if still in afib after 3 weeks of anticoagulation    3.Lifstyle issues/triggers Cut back on some caffeine Regular exercise and weight loss encouraged He can casually walk now as long as he  does not feel symptomatic Sleep study requested with Dr. Maxwell Caul   F/u in afib clinic in 2 weeks   Geroge Baseman. Jalia Zuniga, [facility] [address] [place],  [address] [phone]

## 2020-02-20 ENCOUNTER — Other Ambulatory Visit: Payer: Self-pay

## 2020-02-20 ENCOUNTER — Ambulatory Visit (HOSPITAL_COMMUNITY)
Admission: RE | Admit: 2020-02-20 | Discharge: 2020-02-20 | Disposition: A | Discharge status: Home / Self Care | Payer: Medicare Other | Source: Ambulatory Visit | Attending: Nurse Practitioner | Admitting: Nurse Practitioner

## 2020-02-20 DIAGNOSIS — I080 Rheumatic disorders of both mitral and aortic valves: Secondary | ICD-10-CM | POA: Diagnosis not present

## 2020-02-20 DIAGNOSIS — E785 Hyperlipidemia, unspecified: Secondary | ICD-10-CM | POA: Insufficient documentation

## 2020-02-20 DIAGNOSIS — I119 Hypertensive heart disease without heart failure: Secondary | ICD-10-CM | POA: Insufficient documentation

## 2020-02-20 DIAGNOSIS — I4891 Unspecified atrial fibrillation: Secondary | ICD-10-CM | POA: Insufficient documentation

## 2020-02-20 NOTE — Progress Notes (Signed)
  Echocardiogram 2D Echocardiogram has been performed.  Matilde Bash 02/20/2020, 3:26 PM

## 2020-03-01 ENCOUNTER — Ambulatory Visit (HOSPITAL_COMMUNITY)
Admission: RE | Admit: 2020-03-01 | Discharge: 2020-03-01 | Disposition: A | Discharge status: Home / Self Care | Payer: Medicare Other | Source: Ambulatory Visit | Attending: Nurse Practitioner | Admitting: Nurse Practitioner

## 2020-03-01 ENCOUNTER — Encounter (HOSPITAL_COMMUNITY): Payer: Self-pay | Admitting: Nurse Practitioner

## 2020-03-01 ENCOUNTER — Other Ambulatory Visit: Payer: Self-pay

## 2020-03-01 VITALS — BP 128/80 | HR 81 | Ht 71.0 in | Wt 366.4 lb

## 2020-03-01 DIAGNOSIS — Z7901 Long term (current) use of anticoagulants: Secondary | ICD-10-CM | POA: Insufficient documentation

## 2020-03-01 DIAGNOSIS — I10 Essential (primary) hypertension: Secondary | ICD-10-CM | POA: Diagnosis not present

## 2020-03-01 DIAGNOSIS — Z6841 Body Mass Index (BMI) 40.0 and over, adult: Secondary | ICD-10-CM | POA: Diagnosis not present

## 2020-03-01 DIAGNOSIS — I4891 Unspecified atrial fibrillation: Secondary | ICD-10-CM | POA: Diagnosis not present

## 2020-03-01 DIAGNOSIS — Z79899 Other long term (current) drug therapy: Secondary | ICD-10-CM | POA: Insufficient documentation

## 2020-03-01 DIAGNOSIS — E785 Hyperlipidemia, unspecified: Secondary | ICD-10-CM | POA: Insufficient documentation

## 2020-03-01 DIAGNOSIS — K219 Gastro-esophageal reflux disease without esophagitis: Secondary | ICD-10-CM | POA: Insufficient documentation

## 2020-03-01 DIAGNOSIS — E669 Obesity, unspecified: Secondary | ICD-10-CM | POA: Insufficient documentation

## 2020-03-01 DIAGNOSIS — D6869 Other thrombophilia: Secondary | ICD-10-CM

## 2020-03-01 NOTE — Progress Notes (Signed)
Primary Care Physician: [name], MD Referring Physician:Dr. Cynda Acres, MD   Christopher Hanson is a 69 y.o. male with a h/o HTN, BPH, sleep apnea, obesity,GERD, hyperlipidemia that was found to be in afib, rate at 103 and asymptomatic, at routine PCP visit yesterday.Christopher Hanson He was already on metoprolol and this dose was doubled yesterday. Today HR in the 60's in afib .  He was also started on anticoagulation, Xarelto 20 mg daily, with a CHA2DS2VASc score of 2. Denies  bleeding history. He has had both covid shots, last one 1-2 weeks ago.   He drinks moderate caffeine, does not use tobacco, minimal alcohol. Wife states significant snoring with apnea. Has not had a sleep study in the past. He had labs drawn at PCP and heard last night labs were WNL. He has a BMI of 51.63, weight of 370 lbs.  He was going to gym regularly before covid, got a rowing machine afterward, hurt his back and is now not doing any regular exercise. He has gained back around 36 lbs.  F/u in afib clinic, he remains in rate controlled afib, asymptomatic. Fluid status is stable. He has not missed any anticoagulation since started 3/19. He feels well. Echo showed  EF of 55-65%, with moderate LVH and severely dilated Left atrium. He would be eligible for a cardioversion after 4/9, if no missed DOAC, but he would like to wait until he hears about the date set for sleep study with his appointment with Dr. Maxwell Caul set for 4/8.   Today, he denies symptoms of palpitations, chest pain, shortness of breath, orthopnea, PND, lower extremity edema, dizziness, presyncope, syncope, or neurologic sequela. The patient is tolerating medications without difficulties and is otherwise without complaint today.   Past Medical History:  Diagnosis Date  . GERD (gastroesophageal reflux disease)   . Hyperlipemia   . Hypertension   . Seasonal allergies   . Wears glasses    Past Surgical History:  Procedure Laterality Date  . CARPAL TUNNEL RELEASE      right and left  . COLONOSCOPY    . CYST EXCISION     face  . EAR CYST EXCISION Left 02/11/2015   Procedure: EXCISION OF LEFT POST AURICULAR CYST;  Surgeon: [name], MD;  Location: [facility];  Service: ENT;  Laterality: Left;  . INGUINAL HERNIA REPAIR     bilat-as infant  . NASAL SINUS SURGERY  2000    Current Outpatient Medications  Medication Sig Dispense Refill  . azelastine (ASTELIN) 0.1 % nasal spray Place 1 spray into both nostrils as needed. Use in each nostril as directed     . B Complex-C (B-COMPLEX WITH VITAMIN C) tablet Take 1 tablet by mouth daily. Vitamin B3 with calcium and vitamin d    . CHLORPHENIRAMINE MALEATE PO Dextromethorphan- Taking by mouth twice daily-    . ezetimibe (ZETIA) 10 MG tablet Take 10 mg by mouth daily.    . metoprolol succinate (TOPROL-XL) 100 MG 24 hr tablet Take 100 mg by mouth daily.    . Multiple Vitamins-Minerals (MULTIVITAMIN WITH MINERALS) tablet Take 1 tablet by mouth daily.    Christopher Hanson omeprazole (PRILOSEC) 20 MG capsule Take 20 mg by mouth daily.    . rivaroxaban (XARELTO) 20 MG TABS tablet Take 1 tablet (20 mg total) by mouth daily with supper. 30 tablet 3  . simvastatin (ZOCOR) 40 MG tablet Take 40 mg by mouth at bedtime.    . tamsulosin (FLOMAX) 0.4 MG CAPS capsule  Take 0.4 mg by mouth every 12 (twelve) hours.    . triamterene-hydrochlorothiazide (DYAZIDE) 37.5-25 MG per capsule Take 1 capsule by mouth daily.    . Omega-3 Fatty Acids (FISH OIL) 1000 MG CAPS Taking two tablets by mouth daily     No current facility-administered medications for this encounter.    No Known Allergies  Social History   Socioeconomic History  . Marital status: Married    Spouse name: Not on file  . Number of children: Not on file  . Years of education: Not on file  . Highest education level: Not on file  Occupational History  . Not on file  Tobacco Use  . Smoking status: Never Smoker  . Smokeless tobacco: Never Used  Substance and  Sexual Activity  . Alcohol use: Yes    Comment: Rarely- one drink ,2 glasses occasional  . Drug use: No  . Sexual activity: Not on file  Other Topics Concern  . Not on file  Social History Narrative  . Not on file   Social Determinants of Health   Financial Resource Strain:   . Difficulty of Paying Living Expenses:   Food Insecurity:   . Worried About Charity fundraiser in the Last Year:   . Arboriculturist in the Last Year:   Transportation Needs:   . Film/video editor (Medical):   Christopher Hanson Lack of Transportation (Non-Medical):   Physical Activity:   . Days of Exercise per Week:   . Minutes of Exercise per Session:   Stress:   . Feeling of Stress :   Social Connections:   . Frequency of Communication with Friends and Family:   . Frequency of Social Gatherings with Friends and Family:   . Attends Religious Services:   . Active Member of Clubs or Organizations:   . Attends Archivist Meetings:   Christopher Hanson Marital Status:   Intimate Partner Violence:   . Fear of Current or Ex-Partner:   . Emotionally Abused:   Christopher Hanson Physically Abused:   . Sexually Abused:     No family history on file.  ROS- All systems are reviewed and negative except as per the HPI above  Physical Exam: Vitals:   03/01/20 0838  BP: 128/80  Pulse: 81  Weight: (!) 166.2 kg  Height: 5\' 11"  (1.803 m)   Wt Readings from Last 3 Encounters:  03/01/20 (!) 166.2 kg  02/16/20 (!) 167.9 kg  02/11/15 (!) 174.2 kg    Labs: Lab Results  Component Value Date   NA 140 02/07/2015   K 3.9 02/07/2015   CL 105 02/07/2015   CO2 27 02/07/2015   GLUCOSE 95 02/07/2015   BUN 22 02/07/2015   CREATININE 1.10 02/07/2015   CALCIUM 9.6 02/07/2015   No results found for: INR No results found for: CHOL, HDL, LDLCALC, TRIG   GEN- The patient is well appearing, alert and oriented x 3 today.   Head- normocephalic, atraumatic Eyes-  Sclera clear, conjunctiva pink Ears- hearing intact Oropharynx- clear Neck-  supple, no JVP Lymph- no cervical lymphadenopathy Lungs- Clear to ausculation bilaterally, normal work of breathing Heart- irregular rate and rhythm, no murmurs, rubs or gallops, PMI not laterally displaced GI- soft, NT, ND, + BS Extremities- no clubbing, cyanosis, or edema MS- no significant deformity or atrophy Skin- no rash or lesion Psych- euthymic mood, full affect Neuro- strength and sensation are intact  EKG- afib at 81 bpm, qrs int 94 ms, qtc 434 ms  Echo-1. Left ventricular ejection fraction, by estimation, is 55 to 60%. The  left ventricle has normal function. The left ventricle has no regional  wall motion abnormalities. There is moderate left ventricular hypertrophy.  Left ventricular diastolic  parameters are indeterminate.  2. Right ventricular systolic function is normal. The right ventricular  size is normal. There is mildly elevated pulmonary artery systolic  pressure.  3. Left atrial size was severely dilated.  4. Right atrial size was moderately dilated.  5. The mitral valve is normal in structure. Trivial mitral valve  regurgitation. No evidence of mitral stenosis.  6. The aortic valve is tricuspid. Aortic valve regurgitation is not  visualized. No aortic stenosis is present.  7. The inferior vena cava is dilated in size with >50% respiratory  variability, suggesting right atrial pressure of 8 mmHg.    Assessment and Plan: 1. New onset afib General education re afib  Asymptomatic  Continues rate controlled Continue metoprolol succinate 100 mg daily Echo reviewed with pt   2.CHA2DS2VASc score of 2( age/htn) Started on xarelto 20 mg daily yesterday9 (18) with Dr. Tamala Julian Bleeding precautions discussed  Reminded not to miss any doses  Will plan for cardioversion sometime after 4/9. It may be difficult to restore SR for findings of severely dilated Left atrium. To have the best outcome from DCCV, he would like to have sleep study done and cpap in place  before the cardioversion. He will let me know timing of sleep study after his appointment on 4/8 and date for cardioversion will be set.  3.Lifstyle issues/triggers He has cut back on caffeine Regular exercise and weight loss encouraged He can casually walk now as long as he does not feel symptomatic Sleep study pending  with Dr. Maxwell Caul   F/u in afib clinic per above I anticipate sending him on to general cardiology after cardioversion, they have requested Dr. Julien Nordmann C. Raiza Kiesel, [facility] [address] [place], [place] [address] [phone]

## 2020-03-07 DIAGNOSIS — R0681 Apnea, not elsewhere classified: Secondary | ICD-10-CM | POA: Diagnosis not present

## 2020-03-25 DIAGNOSIS — G4733 Obstructive sleep apnea (adult) (pediatric): Secondary | ICD-10-CM | POA: Diagnosis not present

## 2020-03-26 DIAGNOSIS — G4733 Obstructive sleep apnea (adult) (pediatric): Secondary | ICD-10-CM | POA: Diagnosis not present

## 2020-03-29 DIAGNOSIS — N401 Enlarged prostate with lower urinary tract symptoms: Secondary | ICD-10-CM | POA: Diagnosis not present

## 2020-03-29 DIAGNOSIS — E7800 Pure hypercholesterolemia, unspecified: Secondary | ICD-10-CM | POA: Diagnosis not present

## 2020-03-29 DIAGNOSIS — I4891 Unspecified atrial fibrillation: Secondary | ICD-10-CM | POA: Diagnosis not present

## 2020-03-29 DIAGNOSIS — N400 Enlarged prostate without lower urinary tract symptoms: Secondary | ICD-10-CM | POA: Diagnosis not present

## 2020-03-29 DIAGNOSIS — I10 Essential (primary) hypertension: Secondary | ICD-10-CM | POA: Diagnosis not present

## 2020-03-30 DIAGNOSIS — G4733 Obstructive sleep apnea (adult) (pediatric): Secondary | ICD-10-CM

## 2020-03-30 HISTORY — DX: Obstructive sleep apnea (adult) (pediatric): G47.33

## 2020-04-10 ENCOUNTER — Encounter (HOSPITAL_COMMUNITY): Payer: Self-pay | Admitting: Nurse Practitioner

## 2020-04-10 ENCOUNTER — Other Ambulatory Visit: Payer: Self-pay

## 2020-04-10 ENCOUNTER — Ambulatory Visit (HOSPITAL_COMMUNITY)
Admission: RE | Admit: 2020-04-10 | Discharge: 2020-04-10 | Disposition: A | Discharge status: Home / Self Care | Payer: Medicare Other | Source: Ambulatory Visit | Attending: Nurse Practitioner | Admitting: Nurse Practitioner

## 2020-04-10 VITALS — BP 130/82 | HR 66 | Ht 71.0 in | Wt 358.0 lb

## 2020-04-10 DIAGNOSIS — Z6841 Body Mass Index (BMI) 40.0 and over, adult: Secondary | ICD-10-CM | POA: Diagnosis not present

## 2020-04-10 DIAGNOSIS — I10 Essential (primary) hypertension: Secondary | ICD-10-CM | POA: Diagnosis not present

## 2020-04-10 DIAGNOSIS — E785 Hyperlipidemia, unspecified: Secondary | ICD-10-CM | POA: Diagnosis not present

## 2020-04-10 DIAGNOSIS — I4891 Unspecified atrial fibrillation: Secondary | ICD-10-CM | POA: Diagnosis not present

## 2020-04-10 DIAGNOSIS — E669 Obesity, unspecified: Secondary | ICD-10-CM | POA: Diagnosis not present

## 2020-04-10 DIAGNOSIS — K219 Gastro-esophageal reflux disease without esophagitis: Secondary | ICD-10-CM | POA: Insufficient documentation

## 2020-04-10 DIAGNOSIS — D6869 Other thrombophilia: Secondary | ICD-10-CM

## 2020-04-10 DIAGNOSIS — Z7901 Long term (current) use of anticoagulants: Secondary | ICD-10-CM | POA: Diagnosis not present

## 2020-04-10 DIAGNOSIS — Z79899 Other long term (current) drug therapy: Secondary | ICD-10-CM | POA: Diagnosis not present

## 2020-04-10 LAB — CBC
HCT: 45.6 % (ref 39.0–52.0)
Hemoglobin: 15.2 g/dL (ref 13.0–17.0)
MCH: 29.6 pg (ref 26.0–34.0)
MCHC: 33.3 g/dL (ref 30.0–36.0)
MCV: 88.7 fL (ref 80.0–100.0)
Platelets: 199 10*3/uL (ref 150–400)
RBC: 5.14 MIL/uL (ref 4.22–5.81)
RDW: 13.1 % (ref 11.5–15.5)
WBC: 5.8 10*3/uL (ref 4.0–10.5)
nRBC: 0 % (ref 0.0–0.2)

## 2020-04-10 LAB — BASIC METABOLIC PANEL
Anion gap: 8 (ref 5–15)
BUN: 17 mg/dL (ref 8–23)
CO2: 26 mmol/L (ref 22–32)
Calcium: 9.5 mg/dL (ref 8.9–10.3)
Chloride: 105 mmol/L (ref 98–111)
Creatinine, Ser: 1.07 mg/dL (ref 0.61–1.24)
GFR calc Af Amer: >60 mL/min (ref >60)
GFR calc non Af Amer: >60 mL/min (ref >60)
Glucose, Bld: 103 mg/dL — ABNORMAL HIGH (ref 70–99)
Potassium: 4.7 mmol/L (ref 3.5–5.1)
Sodium: 139 mmol/L (ref 135–145)

## 2020-04-10 NOTE — Progress Notes (Signed)
Primary Care Physician: [name], MD Referring Physician:Dr. [name], MD   Christopher Hanson is a 69 y.o. male with a h/o HTN, BPH, sleep apnea, obesity,GERD, hyperlipidemia that was found to be in afib, rate at 103 and asymptomatic, at routine PCP visit  3/18. He was already on metoprolol and this dose was doubled yesterday. Today HR in the 60's in afib   He was also started on anticoagulation, Xarelto 20 mg daily, with a CHA2DS2VASc score of 2. Denies  bleeding history. He has had both covid shots, last one 1-2 weeks ago.   He drinks moderate caffeine, does not use tobacco, minimal alcohol. Wife states significant snoring with apnea. Has not had a sleep study in the past. He had labs drawn at PCP and heard last night labs were WNL. He has a BMI of 51.63, weight of 370 lbs.  He was going to gym regularly before covid, got a rowing machine afterward, hurt his back and is now not doing any regular exercise. He has gained back around 36 lbs.  F/u in afib clinic,4/2, he remains in rate controlled afib, asymptomatic. Fluid status is stable. He has not missed any anticoagulation since started 3/19. He feels well. Echo showed  EF of 55-65%, with moderate LVH and severely dilated Left atrium. He would be eligible for a cardioversion after 4/9, if no missed DOAC, but he would like to wait until he hears about the date set for sleep study with his appointment with Dr. Maxwell Caul set for 4/8.   He is now back in afib clinic, 5/12,  had positive  sleep study and has been on cpap for 2 nights. He wants to proceed with cardioversion. Has been on anticoagulation for over 2 weeks, no missed doses.   Today, he denies symptoms of palpitations, chest pain, shortness of breath, orthopnea, PND, lower extremity edema, dizziness, presyncope, syncope, or neurologic sequela. The patient is tolerating medications without difficulties and is otherwise without complaint today.   Past Medical History:  Diagnosis Date    . GERD (gastroesophageal reflux disease)   . Hyperlipemia   . Hypertension   . Seasonal allergies   . Wears glasses    Past Surgical History:  Procedure Laterality Date  . CARPAL TUNNEL RELEASE     right and left  . COLONOSCOPY    . CYST EXCISION     face  . EAR CYST EXCISION Left 02/11/2015   Procedure: EXCISION OF LEFT POST AURICULAR CYST;  Surgeon: [name], MD;  Location: [facility];  Service: ENT;  Laterality: Left;  . INGUINAL HERNIA REPAIR     bilat-as infant  . NASAL SINUS SURGERY  2000    Current Outpatient Medications  Medication Sig Dispense Refill  . azelastine (ASTELIN) 0.1 % nasal spray Place 1 spray into both nostrils as needed. Use in each nostril as directed     . B Complex-C (B-COMPLEX WITH VITAMIN C) tablet Take 1 tablet by mouth daily. Vitamin B3 with calcium and vitamin d    . CHLORPHENIRAMINE MALEATE PO Dextromethorphan- Taking by mouth twice daily-    . ezetimibe (ZETIA) 10 MG tablet Take 10 mg by mouth daily.    . metoprolol succinate (TOPROL-XL) 100 MG 24 hr tablet Take 100 mg by mouth daily.    . Multiple Vitamins-Minerals (MULTIVITAMIN WITH MINERALS) tablet Take 1 tablet by mouth daily.    [name] omeprazole (PRILOSEC) 20 MG capsule Take 20 mg by mouth daily.    [name]  rivaroxaban (XARELTO) 20 MG TABS tablet Take 1 tablet (20 mg total) by mouth daily with supper. 30 tablet 3  . simvastatin (ZOCOR) 40 MG tablet Take 40 mg by mouth at bedtime.    . tamsulosin (FLOMAX) 0.4 MG CAPS capsule Take 0.4 mg by mouth every 12 (twelve) hours.    . triamterene-hydrochlorothiazide (DYAZIDE) 37.5-25 MG per capsule Take 1 capsule by mouth daily.     No current facility-administered medications for this encounter.    No Known Allergies  Social History   Socioeconomic History  . Marital status: Married    Spouse name: Not on file  . Number of children: Not on file  . Years of education: Not on file  . Highest education level: Not on file  Occupational  History  . Not on file  Tobacco Use  . Smoking status: Never Smoker  . Smokeless tobacco: Never Used  Substance and Sexual Activity  . Alcohol use: Yes    Comment: Rarely- one drink ,2 glasses occasional  . Drug use: No  . Sexual activity: Not on file  Other Topics Concern  . Not on file  Social History Narrative  . Not on file   Social Determinants of Health   Financial Resource Strain:   . Difficulty of Paying Living Expenses:   Food Insecurity:   . Worried About Charity fundraiser in the Last Year:   . Arboriculturist in the Last Year:   Transportation Needs:   . Film/video editor (Medical):   [name] Lack of Transportation (Non-Medical):   Physical Activity:   . Days of Exercise per Week:   . Minutes of Exercise per Session:   Stress:   . Feeling of Stress :   Social Connections:   . Frequency of Communication with Friends and Family:   . Frequency of Social Gatherings with Friends and Family:   . Attends Religious Services:   . Active Member of Clubs or Organizations:   . Attends Archivist Meetings:   [name] Marital Status:   Intimate Partner Violence:   . Fear of Current or Ex-Partner:   . Emotionally Abused:   [name] Physically Abused:   . Sexually Abused:     No family history on file.  ROS- All systems are reviewed and negative except as per the HPI above  Physical Exam: Vitals:   04/10/20 1002  BP: 130/82  Pulse: 66  Weight: (!) 162.4 kg  Height: 5\' 11"  (1.803 m)   Wt Readings from Last 3 Encounters:  04/10/20 (!) 162.4 kg  03/01/20 (!) 166.2 kg  02/16/20 (!) 167.9 kg    Labs: Lab Results  Component Value Date   NA 140 02/07/2015   K 3.9 02/07/2015   CL 105 02/07/2015   CO2 27 02/07/2015   GLUCOSE 95 02/07/2015   BUN 22 02/07/2015   CREATININE 1.10 02/07/2015   CALCIUM 9.6 02/07/2015   No results found for: INR No results found for: CHOL, HDL, LDLCALC, TRIG   GEN- The patient is well appearing, alert and oriented x 3 today.     Head- normocephalic, atraumatic Eyes-  Sclera clear, conjunctiva pink Ears- hearing intact Oropharynx- clear Neck- supple, no JVP Lymph- no cervical lymphadenopathy Lungs- Clear to ausculation bilaterally, normal work of breathing Heart- irregular rate and rhythm, no murmurs, rubs or gallops, PMI not laterally displaced GI- soft, NT, ND, + BS Extremities- no clubbing, cyanosis, or edema MS- no significant deformity or atrophy Skin- no rash  or lesion Psych- euthymic mood, full affect Neuro- strength and sensation are intact  EKG- afib at 66 bpm, qrs int 94 ms, qtc 434 ms  Echo-1. Left ventricular ejection fraction, by estimation, is 55 to 60%. The  left ventricle has normal function. The left ventricle has no regional  wall motion abnormalities. There is moderate left ventricular hypertrophy.  Left ventricular diastolic  parameters are indeterminate.  2. Right ventricular systolic function is normal. The right ventricular  size is normal. There is mildly elevated pulmonary artery systolic  pressure.  3. Left atrial size was severely dilated.  4. Right atrial size was moderately dilated.  5. The mitral valve is normal in structure. Trivial mitral valve  regurgitation. No evidence of mitral stenosis.  6. The aortic valve is tricuspid. Aortic valve regurgitation is not  visualized. No aortic stenosis is present.  7. The inferior vena cava is dilated in size with >50% respiratory  variability, suggesting right atrial pressure of 8 mmHg.    Assessment and Plan: 1. New onset afib Dx in March at time of physical  Asymptomatic  Continues in  rate controlled in the 60's Will plan on cardioversion Decrease metoprolol succinate back to pre afib dose of 50 mg daily to prevent undue brady in SR With left atrial size and body habitus, tried to set realistic expectations that  cardioversion may not be successful, if so can next discuss antiarrythmic therapy He has both covid shots   Bmet/mag/covid    2.CHA2DS2VASc score of 2( age/htn) Started on xarelto 20 mg daily  (3/18) with Dr. Tamala Julian No  missed doses since start of med  3.Lifstyle issues/triggers He has cut back on caffeine Regular exercise and weight loss encouraged He has lost 8 lbs by tracking food/calories, he hopes to get back in gym when SR is restored  He can casually walk now as long as he does not feel symptomatic Sleep study done  positive and on cpap now   F/u in afib clinic in [place] after cardioversion  I anticipate sending him on to general cardiology after cardioversion, they have requested Dr. Ellyn Hack, will make referral   Geroge Baseman. Bowdy Bair, [facility] [address] [place], [place] [address] [phone]

## 2020-04-10 NOTE — H&P (View-Only) (Signed)
Primary Care Physician: [name], MD Referring Physician:Dr. [name], MD   Christopher Hanson is a 69 y.o. male with a h/o HTN, BPH, sleep apnea, obesity,GERD, hyperlipidemia that was found to be in afib, rate at 103 and asymptomatic, at routine PCP visit  3/18. He was already on metoprolol and this dose was doubled yesterday. Today HR in the 60's in afib   He was also started on anticoagulation, Xarelto 20 mg daily, with a CHA2DS2VASc score of 2. Denies  bleeding history. He has had both covid shots, last one 1-2 weeks ago.   He drinks moderate caffeine, does not use tobacco, minimal alcohol. Wife states significant snoring with apnea. Has not had a sleep study in the past. He had labs drawn at PCP and heard last night labs were WNL. He has a BMI of 51.63, weight of 370 lbs.  He was going to gym regularly before covid, got a rowing machine afterward, hurt his back and is now not doing any regular exercise. He has gained back around 36 lbs.  F/u in afib clinic,4/2, he remains in rate controlled afib, asymptomatic. Fluid status is stable. He has not missed any anticoagulation since started 3/19. He feels well. Echo showed  EF of 55-65%, with moderate LVH and severely dilated Left atrium. He would be eligible for a cardioversion after 4/9, if no missed DOAC, but he would like to wait until he hears about the date set for sleep study with his appointment with Dr. Maxwell Caul set for 4/8.   He is now back in afib clinic, 5/12,  had positive  sleep study and has been on cpap for 2 nights. He wants to proceed with cardioversion. Has been on anticoagulation for over 2 weeks, no missed doses.   Today, he denies symptoms of palpitations, chest pain, shortness of breath, orthopnea, PND, lower extremity edema, dizziness, presyncope, syncope, or neurologic sequela. The patient is tolerating medications without difficulties and is otherwise without complaint today.   Past Medical History:  Diagnosis Date   . GERD (gastroesophageal reflux disease)   . Hyperlipemia   . Hypertension   . Seasonal allergies   . Wears glasses    Past Surgical History:  Procedure Laterality Date  . CARPAL TUNNEL RELEASE     right and left  . COLONOSCOPY    . CYST EXCISION     face  . EAR CYST EXCISION Left 02/11/2015   Procedure: EXCISION OF LEFT POST AURICULAR CYST;  Surgeon: [name], MD;  Location: [facility];  Service: ENT;  Laterality: Left;  . INGUINAL HERNIA REPAIR     bilat-as infant  . NASAL SINUS SURGERY  2000    Current Outpatient Medications  Medication Sig Dispense Refill  . azelastine (ASTELIN) 0.1 % nasal spray Place 1 spray into both nostrils as needed. Use in each nostril as directed     . B Complex-C (B-COMPLEX WITH VITAMIN C) tablet Take 1 tablet by mouth daily. Vitamin B3 with calcium and vitamin d    . CHLORPHENIRAMINE MALEATE PO Dextromethorphan- Taking by mouth twice daily-    . ezetimibe (ZETIA) 10 MG tablet Take 10 mg by mouth daily.    . metoprolol succinate (TOPROL-XL) 100 MG 24 hr tablet Take 100 mg by mouth daily.    . Multiple Vitamins-Minerals (MULTIVITAMIN WITH MINERALS) tablet Take 1 tablet by mouth daily.    [name] omeprazole (PRILOSEC) 20 MG capsule Take 20 mg by mouth daily.    . rivaroxaban (  XARELTO) 20 MG TABS tablet Take 1 tablet (20 mg total) by mouth daily with supper. 30 tablet 3  . simvastatin (ZOCOR) 40 MG tablet Take 40 mg by mouth at bedtime.    . tamsulosin (FLOMAX) 0.4 MG CAPS capsule Take 0.4 mg by mouth every 12 (twelve) hours.    . triamterene-hydrochlorothiazide (DYAZIDE) 37.5-25 MG per capsule Take 1 capsule by mouth daily.     No current facility-administered medications for this encounter.    No Known Allergies  Social History   Socioeconomic History  . Marital status: Married    Spouse name: Not on file  . Number of children: Not on file  . Years of education: Not on file  . Highest education level: Not on file  Occupational  History  . Not on file  Tobacco Use  . Smoking status: Never Smoker  . Smokeless tobacco: Never Used  Substance and Sexual Activity  . Alcohol use: Yes    Comment: Rarely- one drink ,2 glasses occasional  . Drug use: No  . Sexual activity: Not on file  Other Topics Concern  . Not on file  Social History Narrative  . Not on file   Social Determinants of Health   Financial Resource Strain:   . Difficulty of Paying Living Expenses:   Food Insecurity:   . Worried About Charity fundraiser in the Last Year:   . Arboriculturist in the Last Year:   Transportation Needs:   . Film/video editor (Medical):   [name] Lack of Transportation (Non-Medical):   Physical Activity:   . Days of Exercise per Week:   . Minutes of Exercise per Session:   Stress:   . Feeling of Stress :   Social Connections:   . Frequency of Communication with Friends and Family:   . Frequency of Social Gatherings with Friends and Family:   . Attends Religious Services:   . Active Member of Clubs or Organizations:   . Attends Archivist Meetings:   [name] Marital Status:   Intimate Partner Violence:   . Fear of Current or Ex-Partner:   . Emotionally Abused:   [name] Physically Abused:   . Sexually Abused:     No family history on file.  ROS- All systems are reviewed and negative except as per the HPI above  Physical Exam: Vitals:   04/10/20 1002  BP: 130/82  Pulse: 66  Weight: (!) 162.4 kg  Height: 5\' 11"  (1.803 m)   Wt Readings from Last 3 Encounters:  04/10/20 (!) 162.4 kg  03/01/20 (!) 166.2 kg  02/16/20 (!) 167.9 kg    Labs: Lab Results  Component Value Date   NA 140 02/07/2015   K 3.9 02/07/2015   CL 105 02/07/2015   CO2 27 02/07/2015   GLUCOSE 95 02/07/2015   BUN 22 02/07/2015   CREATININE 1.10 02/07/2015   CALCIUM 9.6 02/07/2015   No results found for: INR No results found for: CHOL, HDL, LDLCALC, TRIG   GEN- The patient is well appearing, alert and oriented x 3 today.    Head- normocephalic, atraumatic Eyes-  Sclera clear, conjunctiva pink Ears- hearing intact Oropharynx- clear Neck- supple, no JVP Lymph- no cervical lymphadenopathy Lungs- Clear to ausculation bilaterally, normal work of breathing Heart- irregular rate and rhythm, no murmurs, rubs or gallops, PMI not laterally displaced GI- soft, NT, ND, + BS Extremities- no clubbing, cyanosis, or edema MS- no significant deformity or atrophy Skin- no rash or lesion  Psych- euthymic mood, full affect Neuro- strength and sensation are intact  EKG- afib at 66 bpm, qrs int 94 ms, qtc 434 ms  Echo-1. Left ventricular ejection fraction, by estimation, is 55 to 60%. The  left ventricle has normal function. The left ventricle has no regional  wall motion abnormalities. There is moderate left ventricular hypertrophy.  Left ventricular diastolic  parameters are indeterminate.  2. Right ventricular systolic function is normal. The right ventricular  size is normal. There is mildly elevated pulmonary artery systolic  pressure.  3. Left atrial size was severely dilated.  4. Right atrial size was moderately dilated.  5. The mitral valve is normal in structure. Trivial mitral valve  regurgitation. No evidence of mitral stenosis.  6. The aortic valve is tricuspid. Aortic valve regurgitation is not  visualized. No aortic stenosis is present.  7. The inferior vena cava is dilated in size with >50% respiratory  variability, suggesting right atrial pressure of 8 mmHg.    Assessment and Plan: 1. New onset afib Dx in March at time of physical  Asymptomatic  Continues in  rate controlled in the 60's Will plan on cardioversion Decrease metoprolol succinate back to pre afib dose of 50 mg daily to prevent undue brady in SR With left atrial size and body habitus, tried to set realistic expectations that  cardioversion may not be successful, if so can next discuss antiarrythmic therapy He has both covid shots   Bmet/mag/covid    2.CHA2DS2VASc score of 2( age/htn) Started on xarelto 20 mg daily  (3/18) with Dr. Tamala Julian No  missed doses since start of med  3.Lifstyle issues/triggers He has cut back on caffeine Regular exercise and weight loss encouraged He has lost 8 lbs by tracking food/calories, he hopes to get back in gym when SR is restored  He can casually walk now as long as he does not feel symptomatic Sleep study done  positive and on cpap now   F/u in afib clinic in ome after cardioversion  I anticipate sending him on to general cardiology after cardioversion, they have requested Dr. Ellyn Hack, will make referral   Geroge Baseman. [place], [facility] [address] [place], [place] [address] [phone]

## 2020-04-10 NOTE — Patient Instructions (Signed)
Cardioversion scheduled for Wednesday, May 19th  - Arrive at the [facility] and go to admitting at [facility] not eat or drink anything after midnight the night prior to your procedure.  - Take all your morning medication with a sip of water prior to arrival.  - You will not be able to drive home after your procedure.

## 2020-04-11 ENCOUNTER — Other Ambulatory Visit (HOSPITAL_COMMUNITY): Payer: Self-pay | Admitting: *Deleted

## 2020-04-11 DIAGNOSIS — I4819 Other persistent atrial fibrillation: Secondary | ICD-10-CM

## 2020-04-15 ENCOUNTER — Other Ambulatory Visit (HOSPITAL_COMMUNITY)
Admission: RE | Admit: 2020-04-15 | Discharge: 2020-04-15 | Disposition: A | Discharge status: Home / Self Care | Payer: Medicare Other | Source: Ambulatory Visit | Attending: Cardiovascular Disease | Admitting: Cardiovascular Disease

## 2020-04-15 DIAGNOSIS — Z20822 Contact with and (suspected) exposure to covid-19: Secondary | ICD-10-CM | POA: Insufficient documentation

## 2020-04-15 DIAGNOSIS — Z01812 Encounter for preprocedural laboratory examination: Secondary | ICD-10-CM | POA: Diagnosis not present

## 2020-04-15 LAB — SARS CORONAVIRUS 2 (TAT 6-24 HRS): SARS Coronavirus 2: NEGATIVE

## 2020-04-16 NOTE — Anesthesia Preprocedure Evaluation (Addendum)
Anesthesia Evaluation  Patient identified by MRN, date of birth, ID band Patient awake    Reviewed: Allergy & Precautions, H&P , NPO status , Patient's Chart, lab work & pertinent test results  Airway Mallampati: III  TM Distance: >3 FB Neck ROM: Full    Dental no notable dental hx. (+) Teeth Intact, Dental Advisory Given   Pulmonary neg pulmonary ROS,    Pulmonary exam normal breath sounds clear to auscultation       Cardiovascular Exercise Tolerance: Good hypertension, Pt. on medications and Pt. on home beta blockers + dysrhythmias Atrial Fibrillation  Rhythm:Irregular Rate:Normal     Neuro/Psych negative neurological ROS  negative psych ROS   GI/Hepatic Neg liver ROS, GERD  Medicated,  Endo/Other  Morbid obesity  Renal/GU negative Renal ROS  negative genitourinary   Musculoskeletal   Abdominal   Peds  Hematology negative hematology ROS (+)   Anesthesia Other Findings   Reproductive/Obstetrics negative OB ROS                            Anesthesia Physical Anesthesia Plan  ASA: III  Anesthesia Plan: General   Post-op Pain Management:    Induction: Intravenous  PONV Risk Score and Plan: 2 and Propofol infusion and Treatment may vary due to age or medical condition  Airway Management Planned: Mask  Additional Equipment:   Intra-op Plan:   Post-operative Plan:   Informed Consent: I have reviewed the patients History and Physical, chart, labs and discussed the procedure including the risks, benefits and alternatives for the proposed anesthesia with the patient or authorized representative who has indicated his/her understanding and acceptance.     Dental advisory given  Plan Discussed with: CRNA  Anesthesia Plan Comments:         Anesthesia Quick Evaluation

## 2020-04-17 ENCOUNTER — Encounter (HOSPITAL_COMMUNITY): Payer: Self-pay | Admitting: Cardiovascular Disease

## 2020-04-17 ENCOUNTER — Ambulatory Visit (HOSPITAL_COMMUNITY): Payer: Medicare Other | Admitting: Anesthesiology

## 2020-04-17 ENCOUNTER — Encounter (HOSPITAL_COMMUNITY)
Admission: RE | Disposition: A | Discharge status: Home / Self Care | Payer: Self-pay | Source: Home / Self Care | Attending: Cardiovascular Disease

## 2020-04-17 ENCOUNTER — Other Ambulatory Visit: Payer: Self-pay

## 2020-04-17 ENCOUNTER — Ambulatory Visit (HOSPITAL_COMMUNITY)
Admission: RE | Admit: 2020-04-17 | Discharge: 2020-04-17 | Disposition: A | Discharge status: Home / Self Care | Payer: Medicare Other | Attending: Cardiovascular Disease | Admitting: Cardiovascular Disease

## 2020-04-17 DIAGNOSIS — K219 Gastro-esophageal reflux disease without esophagitis: Secondary | ICD-10-CM | POA: Insufficient documentation

## 2020-04-17 DIAGNOSIS — I4891 Unspecified atrial fibrillation: Secondary | ICD-10-CM | POA: Insufficient documentation

## 2020-04-17 DIAGNOSIS — Z7901 Long term (current) use of anticoagulants: Secondary | ICD-10-CM | POA: Insufficient documentation

## 2020-04-17 DIAGNOSIS — E785 Hyperlipidemia, unspecified: Secondary | ICD-10-CM | POA: Diagnosis not present

## 2020-04-17 DIAGNOSIS — N400 Enlarged prostate without lower urinary tract symptoms: Secondary | ICD-10-CM | POA: Insufficient documentation

## 2020-04-17 DIAGNOSIS — G4730 Sleep apnea, unspecified: Secondary | ICD-10-CM | POA: Diagnosis not present

## 2020-04-17 DIAGNOSIS — I10 Essential (primary) hypertension: Secondary | ICD-10-CM | POA: Insufficient documentation

## 2020-04-17 DIAGNOSIS — Z6841 Body Mass Index (BMI) 40.0 and over, adult: Secondary | ICD-10-CM | POA: Diagnosis not present

## 2020-04-17 DIAGNOSIS — Z79899 Other long term (current) drug therapy: Secondary | ICD-10-CM | POA: Diagnosis not present

## 2020-04-17 DIAGNOSIS — E669 Obesity, unspecified: Secondary | ICD-10-CM | POA: Diagnosis not present

## 2020-04-17 HISTORY — PX: CARDIOVERSION: SHX1299

## 2020-04-17 SURGERY — CARDIOVERSION
Anesthesia: General

## 2020-04-17 MED ORDER — LIDOCAINE 2% (20 MG/ML) 5 ML SYRINGE
INTRAMUSCULAR | Status: DC | PRN
  Administered 2020-04-17: 60 mg via INTRAVENOUS

## 2020-04-17 MED ORDER — PROPOFOL 10 MG/ML IV BOLUS
INTRAVENOUS | Status: DC | PRN
  Administered 2020-04-17 (×3): 50 mg via INTRAVENOUS
  Administered 2020-04-17: 30 mg via INTRAVENOUS
  Administered 2020-04-17: 20 mg via INTRAVENOUS

## 2020-04-17 MED ORDER — SODIUM CHLORIDE 0.9 % IV SOLN: INTRAVENOUS | Status: DC

## 2020-04-17 NOTE — Anesthesia Postprocedure Evaluation (Signed)
Anesthesia Post Note  Patient: Christopher Hanson  Procedure(s) Performed: CARDIOVERSION (N/A )     Patient location during evaluation: Endoscopy Anesthesia Type: General Level of consciousness: awake and alert Pain management: pain level controlled Vital Signs Assessment: post-procedure vital signs reviewed and stable Respiratory status: spontaneous breathing, nonlabored ventilation and respiratory function stable Cardiovascular status: blood pressure returned to baseline and stable Postop Assessment: no apparent nausea or vomiting Anesthetic complications: no    Last Vitals:  Vitals:   04/17/20 1020 04/17/20 1030  BP: 97/62 107/73  Pulse: 61 68  Resp: 12 (!) 33  Temp:    SpO2: 96% 100%    Last Pain:  Vitals:   04/17/20 1030  TempSrc:   PainSc: 0-No pain                 Kateryna Grantham,W. EDMOND

## 2020-04-17 NOTE — Discharge Instructions (Signed)
Electrical Cardioversion Electrical cardioversion is the delivery of a jolt of electricity to restore a normal rhythm to the heart. A rhythm that is too fast or is not regular keeps the heart from pumping well. In this procedure, sticky patches or metal paddles are placed on the chest to deliver electricity to the heart from a device. This procedure may be done in an emergency if:  There is low or no blood pressure as a result of the heart rhythm.  Normal rhythm must be restored as fast as possible to protect the brain and heart from further damage.  It may save a life. This may also be a scheduled procedure for irregular or fast heart rhythms that are not immediately life-threatening. Tell a health care provider about:  Any allergies you have.  All medicines you are taking, including vitamins, herbs, eye drops, creams, and over-the-counter medicines.  Any problems you or family members have had with anesthetic medicines.  Any blood disorders you have.  Any surgeries you have had.  Any medical conditions you have.  Whether you are pregnant or may be pregnant. What are the risks? Generally, this is a safe procedure. However, problems may occur, including:  Allergic reactions to medicines.  A blood clot that breaks free and travels to other parts of your body.  The possible return of an abnormal heart rhythm within hours or days after the procedure.  Your heart stopping (cardiac arrest). This is rare. What happens before the procedure? Medicines  Your health care provider may have you start taking: ? Blood-thinning medicines (anticoagulants) so your blood does not clot as easily. ? Medicines to help stabilize your heart rate and rhythm.  Ask your health care provider about: ? Changing or stopping your regular medicines. This is especially important if you are taking diabetes medicines or blood thinners. ? Taking medicines such as aspirin and ibuprofen. These medicines can  thin your blood. Do not take these medicines unless your health care provider tells you to take them. ? Taking over-the-counter medicines, vitamins, herbs, and supplements. General instructions  Follow instructions from your health care provider about eating or drinking restrictions.  Plan to have someone take you home from the hospital or clinic.  If you will be going home right after the procedure, plan to have someone with you for 24 hours.  Ask your health care provider what steps will be taken to help prevent infection. These may include washing your skin with a germ-killing soap. What happens during the procedure?   An IV will be inserted into one of your veins.  Sticky patches (electrodes) or metal paddles may be placed on your chest.  You will be given a medicine to help you relax (sedative).  An electrical shock will be delivered. The procedure may vary among health care providers and hospitals. What can I expect after the procedure?  Your blood pressure, heart rate, breathing rate, and blood oxygen level will be monitored until you leave the hospital or clinic.  Your heart rhythm will be watched to make sure it does not change.  You may have some redness on the skin where the shocks were given. Follow these instructions at home:  Do not drive for 24 hours if you were given a sedative during your procedure.  Take over-the-counter and prescription medicines only as told by your health care provider.  Ask your health care provider how to check your pulse. Check it often.  Rest for 48 hours after the procedure or   as told by your health care provider.  Avoid or limit your caffeine use as told by your health care provider.  Keep all follow-up visits as told by your health care provider. This is important. Contact a health care provider if:  You feel like your heart is beating too quickly or your pulse is not regular.  You have a serious muscle cramp that does not go  away. Get help right away if:  You have discomfort in your chest.  You are dizzy or you feel faint.  You have trouble breathing or you are short of breath.  Your speech is slurred.  You have trouble moving an arm or leg on one side of your body.  Your fingers or toes turn cold or blue. Summary  Electrical cardioversion is the delivery of a jolt of electricity to restore a normal rhythm to the heart.  This procedure may be done right away in an emergency or may be a scheduled procedure if the condition is not an emergency.  Generally, this is a safe procedure.  After the procedure, check your pulse often as told by your health care provider. This information is not intended to replace advice given to you by your health care provider. Make sure you discuss any questions you have with your health care provider. Document Revised: 06/19/2019 Document Reviewed: 06/19/2019 Elsevier Patient Education  2020 Elsevier Inc.  

## 2020-04-17 NOTE — CV Procedure (Signed)
   DIRECT CURRENT CARDIOVERSION  NAME:  Christopher Hanson    MRN: [id number] DOB:  1951/06/24    ADMIT DATE: 04/17/2020  Indication:  Symptomatic atrial fibrillation  Procedure Note:  The patient signed informed consent.  They have had had therapeutic anticoagulation with xarelto greater than 3 weeks.  Anesthesia was administered by Dr. Ola Spurr.  Adequate airway was maintained throughout and vital followed per protocol.  They were cardioverted x 3 with 200J of biphasic synchronized energy. The first attempt was unsuccessful. Pads were replaced and re-attempted with max AP pressure. A third attempt was made with reposition of pads and AP pressure. The patient remain in Afib. There was no break in his atrial fibrillation. He remained in this the entire time. There were no apparent complications.  The patient had normal neuro status and respiratory status post procedure with vitals stable as recorded elsewhere.    Follow up: They will continue on current medical therapy and follow up with cardiology as scheduled.  [name], [place]  [address], [place], [place] [address] [phone]  9:56 AM

## 2020-04-17 NOTE — Transfer of Care (Signed)
Immediate Anesthesia Transfer of Care Note  Patient: Christopher Hanson  Procedure(s) Performed: CARDIOVERSION (N/A )  Patient Location: PACU and Endoscopy Unit  Anesthesia Type:General  Level of Consciousness: patient cooperative and responds to stimulation  Airway & Oxygen Therapy: Patient Spontanous Breathing  Post-op Assessment: Report given to RN and Post -op Vital signs reviewed and stable  Post vital signs: Reviewed and stable  Last Vitals:  Vitals Value Taken Time  BP    Temp    Pulse    Resp    SpO2      Last Pain:  Vitals:   04/17/20 0914  TempSrc: Oral  PainSc: 0-No pain         Complications: No apparent anesthesia complications

## 2020-04-17 NOTE — Interval H&P Note (Signed)
History and Physical Interval Note:  04/17/2020 9:16 AM  Christopher Hanson  has presented today for surgery, with the diagnosis of AFIB.  The various methods of treatment have been discussed with the patient and family. After consideration of risks, benefits and other options for treatment, the patient has consented to  Procedure(s): CARDIOVERSION (N/A) as a surgical intervention.  The patient's history has been reviewed, patient examined, no change in status, stable for surgery.  I have reviewed the patient's chart and labs.  Questions were answered to the patient's satisfaction.     DCCV for Afib. NPO. Xarelto. No missed doses in last 3 weeks.  [name], [name]  [address], [name], [name] [address] [phone]  9:16 AM

## 2020-04-22 DIAGNOSIS — N400 Enlarged prostate without lower urinary tract symptoms: Secondary | ICD-10-CM | POA: Diagnosis not present

## 2020-04-22 DIAGNOSIS — E7800 Pure hypercholesterolemia, unspecified: Secondary | ICD-10-CM | POA: Diagnosis not present

## 2020-04-22 DIAGNOSIS — I10 Essential (primary) hypertension: Secondary | ICD-10-CM | POA: Diagnosis not present

## 2020-04-22 DIAGNOSIS — I4891 Unspecified atrial fibrillation: Secondary | ICD-10-CM | POA: Diagnosis not present

## 2020-04-22 DIAGNOSIS — N401 Enlarged prostate with lower urinary tract symptoms: Secondary | ICD-10-CM | POA: Diagnosis not present

## 2020-04-24 ENCOUNTER — Ambulatory Visit (HOSPITAL_COMMUNITY)
Admission: RE | Admit: 2020-04-24 | Discharge: 2020-04-24 | Disposition: A | Discharge status: Home / Self Care | Payer: Medicare Other | Source: Ambulatory Visit | Attending: Nurse Practitioner | Admitting: Nurse Practitioner

## 2020-04-24 ENCOUNTER — Other Ambulatory Visit: Payer: Self-pay

## 2020-04-24 ENCOUNTER — Encounter (HOSPITAL_COMMUNITY): Payer: Self-pay | Admitting: Nurse Practitioner

## 2020-04-24 ENCOUNTER — Other Ambulatory Visit (HOSPITAL_COMMUNITY): Payer: Self-pay | Admitting: *Deleted

## 2020-04-24 ENCOUNTER — Telehealth: Payer: Self-pay | Admitting: Pharmacist

## 2020-04-24 VITALS — BP 120/68 | HR 67 | Ht 71.0 in | Wt 354.4 lb

## 2020-04-24 DIAGNOSIS — I4891 Unspecified atrial fibrillation: Secondary | ICD-10-CM | POA: Insufficient documentation

## 2020-04-24 DIAGNOSIS — Z79899 Other long term (current) drug therapy: Secondary | ICD-10-CM | POA: Diagnosis not present

## 2020-04-24 DIAGNOSIS — Z7901 Long term (current) use of anticoagulants: Secondary | ICD-10-CM | POA: Insufficient documentation

## 2020-04-24 DIAGNOSIS — Z6841 Body Mass Index (BMI) 40.0 and over, adult: Secondary | ICD-10-CM | POA: Diagnosis not present

## 2020-04-24 DIAGNOSIS — K219 Gastro-esophageal reflux disease without esophagitis: Secondary | ICD-10-CM | POA: Diagnosis not present

## 2020-04-24 DIAGNOSIS — I10 Essential (primary) hypertension: Secondary | ICD-10-CM | POA: Diagnosis not present

## 2020-04-24 DIAGNOSIS — D6869 Other thrombophilia: Secondary | ICD-10-CM

## 2020-04-24 DIAGNOSIS — E785 Hyperlipidemia, unspecified: Secondary | ICD-10-CM | POA: Diagnosis not present

## 2020-04-24 DIAGNOSIS — I4819 Other persistent atrial fibrillation: Secondary | ICD-10-CM

## 2020-04-24 MED ORDER — TRIAMTERENE 50 MG PO CAPS: 50.0000 mg | ORAL_CAPSULE | Freq: Every day | ORAL | 3 refills | Status: DC

## 2020-04-24 NOTE — Progress Notes (Signed)
Primary Care Physician: [name], MD Referring Physician:Dr. [name], MD   Christopher Hanson is a 69 y.o. male with a h/o HTN, BPH, sleep apnea, obesity,GERD, hyperlipidemia that was found to be in afib, rate at 103 and asymptomatic, at routine PCP visit  3/18. He was already on metoprolol and this dose was doubled yesterday. Today HR in the 60's in afib   He was also started on anticoagulation, Xarelto 20 mg daily, with a CHA2DS2VASc score of 2. Denies  bleeding history. He has had both covid shots, last one 1-2 weeks ago.   He drinks moderate caffeine, does not use tobacco, minimal alcohol. Wife states significant snoring with apnea. Has not had a sleep study in the past. He had labs drawn at PCP and heard last night labs were WNL. He has a BMI of 51.63, weight of 370 lbs.  He was going to gym regularly before covid, got a rowing machine afterward, hurt his back and is now not doing any regular exercise. He has gained back around 36 lbs.  F/u in afib clinic,4/2, he remains in rate controlled afib, asymptomatic. Fluid status is stable. He has not missed any anticoagulation since started 3/19. He feels well. Echo showed  EF of 55-65%, with moderate LVH and severely dilated Left atrium. He would be eligible for a cardioversion after 4/9, if no missed DOAC, but he would like to wait until he hears about the date set for sleep study with his appointment with Dr. Maxwell Caul set for 4/8.   He is now back in afib clinic, 5/12,  had positive  sleep study and has been on cpap for 2 nights. He wants to proceed with cardioversion. Has been on anticoagulation for over 2 weeks, no missed doses.   F/u in afib clinic 5/26. Unfortunately, DCCV was not successful.  It was not entirely surprising as pt has a severely dilated left atrium and his morbid  obesity is also contributing. He has been on cpap now x  2 weeks.   Today, he denies symptoms of palpitations, chest pain, shortness of breath, orthopnea,  PND, lower extremity edema, dizziness, presyncope, syncope, or neurologic sequela. The patient is tolerating medications without difficulties and is otherwise without complaint today.   Past Medical History:  Diagnosis Date  . GERD (gastroesophageal reflux disease)   . Hyperlipemia   . Hypertension   . Seasonal allergies   . Wears glasses    Past Surgical History:  Procedure Laterality Date  . CARDIOVERSION N/A 04/17/2020   Procedure: CARDIOVERSION;  Surgeon: [name], MD;  Location: [facility];  Service: Cardiovascular;  Laterality: N/A;  . CARPAL [place]     right and left  . COLONOSCOPY    . CYST EXCISION     face  . EAR CYST EXCISION Left 02/11/2015   Procedure: EXCISION OF LEFT POST AURICULAR CYST;  Surgeon: [name], MD;  Location: [facility];  Service: ENT;  Laterality: Left;  . INGUINAL HERNIA REPAIR     bilat-as infant  . NASAL SINUS SURGERY  2000    Current Outpatient Medications  Medication Sig Dispense Refill  . acetaminophen (TYLENOL) 500 MG tablet Take 1,000 mg by mouth as needed for headache.     . chlorpheniramine (CHLOR-TRIMETON) 4 MG tablet Take 1 tablet by mouth in the morning and at bedtime. Dextromethorphan- HBR    . Cholecalciferol (VITAMIN D3) 50 MCG (2000 UT) TABS Take 2,000 Units by mouth daily.    [name]  ezetimibe (ZETIA) 10 MG tablet Take 10 mg by mouth at bedtime.     . metoprolol succinate (TOPROL-XL) 100 MG 24 hr tablet Take 100 mg by mouth daily.    . Multiple Minerals-Vitamins (CALCIUM-MAGNESIUM-ZINC-D3 PO) Take 333 mg by mouth daily.    . Multiple Vitamins-Minerals (MULTIVITAMIN WITH MINERALS) tablet Take 1 tablet by mouth daily. Mens 50 +    . omeprazole (PRILOSEC) 20 MG capsule Take 20 mg by mouth daily.    . rivaroxaban (XARELTO) 20 MG TABS tablet Take 1 tablet (20 mg total) by mouth daily with supper. (Patient taking differently: Take 20 mg by mouth at bedtime. ) 30 tablet 3  . simvastatin (ZOCOR) 40 MG tablet  Take 40 mg by mouth at bedtime.    . tamsulosin (FLOMAX) 0.4 MG CAPS capsule Take 0.4 mg by mouth in the morning and at bedtime.     . triamterene-hydrochlorothiazide (DYAZIDE) 37.5-25 MG per capsule Take 1 capsule by mouth daily.     No current facility-administered medications for this encounter.    No Known Allergies  Social History   Socioeconomic History  . Marital status: Married    Spouse name: Not on file  . Number of children: Not on file  . Years of education: Not on file  . Highest education level: Not on file  Occupational History  . Not on file  Tobacco Use  . Smoking status: Never Smoker  . Smokeless tobacco: Never Used  Substance and Sexual Activity  . Alcohol use: Yes    Comment: Rarely- one drink ,2 glasses occasional  . Drug use: No  . Sexual activity: Not on file  Other Topics Concern  . Not on file  Social History Narrative  . Not on file   Social Determinants of Health   Financial Resource Strain:   . Difficulty of Paying Living Expenses:   Food Insecurity:   . Worried About Charity fundraiser in the Last Year:   . Arboriculturist in the Last Year:   Transportation Needs:   . Film/video editor (Medical):   [name] Lack of Transportation (Non-Medical):   Physical Activity:   . Days of Exercise per Week:   . Minutes of Exercise per Session:   Stress:   . Feeling of Stress :   Social Connections:   . Frequency of Communication with Friends and Family:   . Frequency of Social Gatherings with Friends and Family:   . Attends Religious Services:   . Active Member of Clubs or Organizations:   . Attends Archivist Meetings:   [name] Marital Status:   Intimate Partner Violence:   . Fear of Current or Ex-Partner:   . Emotionally Abused:   [name] Physically Abused:   . Sexually Abused:     No family history on file.  ROS- All systems are reviewed and negative except as per the HPI above  Physical Exam: Vitals:   04/24/20 1114  BP: 120/68    Pulse: 67  Weight: (!) 160.8 kg  Height: 5\' 11"  (1.803 m)   Wt Readings from Last 3 Encounters:  04/24/20 (!) 160.8 kg  04/10/20 (!) 162.4 kg  03/01/20 (!) 166.2 kg    Labs: Lab Results  Component Value Date   NA 139 04/10/2020   K 4.7 04/10/2020   CL 105 04/10/2020   CO2 26 04/10/2020   GLUCOSE 103 (H) 04/10/2020   BUN 17 04/10/2020   CREATININE 1.07 04/10/2020   CALCIUM 9.5  04/10/2020   No results found for: INR No results found for: CHOL, HDL, LDLCALC, TRIG   GEN- The patient is well appearing, alert and oriented x 3 today.   Head- normocephalic, atraumatic Eyes-  Sclera clear, conjunctiva pink Ears- hearing intact Oropharynx- clear Neck- supple, no JVP Lymph- no cervical lymphadenopathy Lungs- Clear to ausculation bilaterally, normal work of breathing Heart- irregular rate and rhythm, no murmurs, rubs or gallops, PMI not laterally displaced GI- soft, NT, ND, + BS Extremities- no clubbing, cyanosis, or edema MS- no significant deformity or atrophy Skin- no rash or lesion Psych- euthymic mood, full affect Neuro- strength and sensation are intact  EKG- afib at 67  bpm, qrs int 100 ms, qtc 405 ms  Echo-1. Left ventricular ejection fraction, by estimation, is 55 to 60%. The  left ventricle has normal function. The left ventricle has no regional  wall motion abnormalities. There is moderate left ventricular hypertrophy.  Left ventricular diastolic  parameters are indeterminate.  2. Right ventricular systolic function is normal. The right ventricular  size is normal. There is mildly elevated pulmonary artery systolic  pressure.  3. Left atrial size was severely dilated.  4. Right atrial size was moderately dilated.  5. The mitral valve is normal in structure. Trivial mitral valve  regurgitation. No evidence of mitral stenosis.  6. The aortic valve is tricuspid. Aortic valve regurgitation is not  visualized. No aortic stenosis is present.  7. The  inferior vena cava is dilated in size with >50% respiratory  variability, suggesting right atrial pressure of 8 mmHg.    Assessment and Plan: 1. New onset afib Dx in March at time of physical  Asymptomatic  Continues in  rate controlled in the 60's Cardioversion was unsuccessful  Continue metoprolol 100 mg qd  With left atrial size and body habitus, and cardioversion being unsuccessful, discussed tikosyn/amiodarone being the best antiarrythmic's to try to restore SR.  Still stressed to pt that it may be difficult to restore and maintain SR even with antiarrythmic  He has both covid shots  Pt is leaning toward tikosyn admit  He would have to stop hctz qtc looks acceptable    2.CHA2DS2VASc score of 2( age/htn) Started on xarelto 20 mg daily  (3/18) with Dr. Tamala Julian No  missed doses since start of med  3.Lifstyle issues/triggers He has cut back on caffeine Regular exercise and weight loss encouraged He has lost over 10 lbs by tracking food/calories, he hopes to get back in gym when SR is restored  He can casually walk now as long as he does not feel symptomatic Sleep study done  positive and continues on cpap  He and his wife will consider coming into the hospital for tikosyn, will call back when he will be ready to commit    Butch Penny C. Nandita Mathenia, [facility] [address] [place], [place] [address] [phone]

## 2020-04-24 NOTE — Telephone Encounter (Signed)
Medication list reviewed in anticipation of upcoming Tikosyn initiation. Patient is taking triamterene-HCTZ (also has separate triamterene on med list) which will need to be stopped as they can increase concentrations of Tikosyn.   Patient is anticoagulated on Xarelto 20mg  daily on the appropriate dose. Please ensure that patient has not missed any anticoagulation doses in the 3 weeks prior to Tikosyn initiation.   Would also recommend changing from chlorpheniramine to 2nd generation antihistamine as 1st generation antihistamines can be QTc prolonging at high doses.

## 2020-04-25 ENCOUNTER — Other Ambulatory Visit (HOSPITAL_COMMUNITY): Payer: Self-pay | Admitting: *Deleted

## 2020-04-25 ENCOUNTER — Encounter (HOSPITAL_COMMUNITY): Payer: Self-pay

## 2020-04-25 MED ORDER — LISINOPRIL 10 MG PO TABS
10.0000 mg | ORAL_TABLET | Freq: Every day | ORAL | 3 refills | Status: DC
Start: 2020-05-11 — End: 2020-05-27

## 2020-04-25 NOTE — Telephone Encounter (Signed)
Patient notified to stop Dyazide 3 days prior to tikosyn initiation. He will start Lisinopril 10mg  once a day on 6/12. Also notified to change Chlorpheniramine to Zyrtec or Claritin.

## 2020-04-30 ENCOUNTER — Encounter (HOSPITAL_COMMUNITY): Payer: Self-pay

## 2020-05-07 DIAGNOSIS — G4733 Obstructive sleep apnea (adult) (pediatric): Secondary | ICD-10-CM | POA: Diagnosis not present

## 2020-05-11 ENCOUNTER — Other Ambulatory Visit (HOSPITAL_COMMUNITY)
Admission: RE | Admit: 2020-05-11 | Discharge: 2020-05-11 | Disposition: A | Discharge status: Home / Self Care | Payer: Medicare Other | Source: Ambulatory Visit | Attending: Nurse Practitioner | Admitting: Nurse Practitioner

## 2020-05-11 DIAGNOSIS — Z01812 Encounter for preprocedural laboratory examination: Secondary | ICD-10-CM | POA: Insufficient documentation

## 2020-05-11 DIAGNOSIS — Z20822 Contact with and (suspected) exposure to covid-19: Secondary | ICD-10-CM | POA: Insufficient documentation

## 2020-05-11 LAB — SARS CORONAVIRUS 2 (TAT 6-24 HRS): SARS Coronavirus 2: NEGATIVE

## 2020-05-14 ENCOUNTER — Inpatient Hospital Stay (HOSPITAL_COMMUNITY)
Admission: RE | Admit: 2020-05-14 | Discharge: 2020-05-17 | DRG: 309 | Disposition: A | Discharge status: Home / Self Care | Payer: Medicare Other | Attending: Internal Medicine | Admitting: Internal Medicine

## 2020-05-14 ENCOUNTER — Ambulatory Visit (HOSPITAL_COMMUNITY)
Admission: RE | Admit: 2020-05-14 | Discharge: 2020-05-14 | Disposition: A | Discharge status: Home / Self Care | Payer: Medicare Other | Source: Ambulatory Visit | Attending: Nurse Practitioner | Admitting: Nurse Practitioner

## 2020-05-14 ENCOUNTER — Other Ambulatory Visit: Payer: Self-pay

## 2020-05-14 VITALS — BP 120/70 | HR 89 | Ht 71.0 in | Wt 371.4 lb

## 2020-05-14 DIAGNOSIS — G4733 Obstructive sleep apnea (adult) (pediatric): Secondary | ICD-10-CM

## 2020-05-14 DIAGNOSIS — Z91048 Other nonmedicinal substance allergy status: Secondary | ICD-10-CM | POA: Diagnosis not present

## 2020-05-14 DIAGNOSIS — K219 Gastro-esophageal reflux disease without esophagitis: Secondary | ICD-10-CM | POA: Diagnosis present

## 2020-05-14 DIAGNOSIS — Z6841 Body Mass Index (BMI) 40.0 and over, adult: Secondary | ICD-10-CM

## 2020-05-14 DIAGNOSIS — I4819 Other persistent atrial fibrillation: Secondary | ICD-10-CM

## 2020-05-14 DIAGNOSIS — D6869 Other thrombophilia: Secondary | ICD-10-CM

## 2020-05-14 DIAGNOSIS — I10 Essential (primary) hypertension: Secondary | ICD-10-CM | POA: Diagnosis present

## 2020-05-14 DIAGNOSIS — Z20822 Contact with and (suspected) exposure to covid-19: Secondary | ICD-10-CM | POA: Diagnosis present

## 2020-05-14 DIAGNOSIS — E785 Hyperlipidemia, unspecified: Secondary | ICD-10-CM | POA: Diagnosis present

## 2020-05-14 DIAGNOSIS — I480 Paroxysmal atrial fibrillation: Secondary | ICD-10-CM

## 2020-05-14 DIAGNOSIS — I4891 Unspecified atrial fibrillation: Secondary | ICD-10-CM | POA: Diagnosis present

## 2020-05-14 LAB — BASIC METABOLIC PANEL
Anion gap: 8 (ref 5–15)
BUN: 14 mg/dL (ref 8–23)
CO2: 26 mmol/L (ref 22–32)
Calcium: 9.1 mg/dL (ref 8.9–10.3)
Chloride: 109 mmol/L (ref 98–111)
Creatinine, Ser: 1.17 mg/dL (ref 0.61–1.24)
GFR calc Af Amer: >60 mL/min (ref >60)
GFR calc non Af Amer: >60 mL/min (ref >60)
Glucose, Bld: 72 mg/dL (ref 70–99)
Potassium: 4.2 mmol/L (ref 3.5–5.1)
Sodium: 143 mmol/L (ref 135–145)

## 2020-05-14 LAB — HIV ANTIBODY (ROUTINE TESTING W REFLEX): HIV Screen 4th Generation wRfx: NONREACTIVE

## 2020-05-14 LAB — MAGNESIUM: Magnesium: 1.9 mg/dL (ref 1.7–2.4)

## 2020-05-14 MED ORDER — SIMVASTATIN 20 MG PO TABS
40.0000 mg | ORAL_TABLET | Freq: Every day | ORAL | Status: DC
  Administered 2020-05-14 – 2020-05-16 (×3): 40 mg via ORAL
  Filled 2020-05-14 (×3): qty 2

## 2020-05-14 MED ORDER — RIVAROXABAN 20 MG PO TABS
20.0000 mg | ORAL_TABLET | Freq: Every day | ORAL | Status: DC
  Administered 2020-05-14 – 2020-05-16 (×3): 20 mg via ORAL
  Filled 2020-05-14 (×3): qty 1

## 2020-05-14 MED ORDER — TAMSULOSIN HCL 0.4 MG PO CAPS
0.4000 mg | ORAL_CAPSULE | Freq: Every day | ORAL | Status: DC
  Administered 2020-05-14: 0.4 mg via ORAL
  Filled 2020-05-14: qty 1

## 2020-05-14 MED ORDER — DOFETILIDE 500 MCG PO CAPS
500.0000 ug | ORAL_CAPSULE | Freq: Two times a day (BID) | ORAL | Status: DC
  Administered 2020-05-14 – 2020-05-17 (×6): 500 ug via ORAL
  Filled 2020-05-14 (×6): qty 1

## 2020-05-14 MED ORDER — LISINOPRIL 10 MG PO TABS
10.0000 mg | ORAL_TABLET | Freq: Every day | ORAL | Status: DC
  Administered 2020-05-15 – 2020-05-17 (×3): 10 mg via ORAL
  Filled 2020-05-14 (×3): qty 1

## 2020-05-14 MED ORDER — SODIUM CHLORIDE 0.9% FLUSH: 3.0000 mL | INTRAVENOUS | Status: DC | PRN

## 2020-05-14 MED ORDER — METOPROLOL SUCCINATE ER 100 MG PO TB24
100.0000 mg | ORAL_TABLET | Freq: Every day | ORAL | Status: DC
  Administered 2020-05-15 – 2020-05-17 (×3): 100 mg via ORAL
  Filled 2020-05-14 (×3): qty 1

## 2020-05-14 MED ORDER — SODIUM CHLORIDE 0.9% FLUSH
3.0000 mL | Freq: Two times a day (BID) | INTRAVENOUS | Status: DC
  Administered 2020-05-14 – 2020-05-17 (×5): 3 mL via INTRAVENOUS

## 2020-05-14 MED ORDER — MAGNESIUM SULFATE 2 GM/50ML IV SOLN
2.0000 g | Freq: Once | INTRAVENOUS | Status: AC
  Administered 2020-05-14: 2 g via INTRAVENOUS
  Filled 2020-05-14: qty 50

## 2020-05-14 MED ORDER — EZETIMIBE 10 MG PO TABS
10.0000 mg | ORAL_TABLET | Freq: Every day | ORAL | Status: DC
  Administered 2020-05-14 – 2020-05-16 (×3): 10 mg via ORAL
  Filled 2020-05-14 (×3): qty 1

## 2020-05-14 MED ORDER — SODIUM CHLORIDE 0.9 % IV SOLN: 250.0000 mL | INTRAVENOUS | Status: DC | PRN

## 2020-05-14 MED ORDER — LORATADINE 10 MG PO TABS
10.0000 mg | ORAL_TABLET | Freq: Every day | ORAL | Status: DC
  Administered 2020-05-15 – 2020-05-17 (×3): 10 mg via ORAL
  Filled 2020-05-14 (×3): qty 1

## 2020-05-14 NOTE — Care Management (Signed)
1438 05-14-20 Patient presented for Tikosyn Load! Benefits check in process and Case Manager will follow for cost. Graves-Bigelow, Ocie Cornfield, RN,BSN Case Manager

## 2020-05-14 NOTE — Addendum Note (Signed)
Encounter addended by: Sherran Needs, NP on: 05/14/2020 1:25 PM  Actions taken: Clinical Note Signed

## 2020-05-14 NOTE — Progress Notes (Signed)
Pharmacy: Dofetilide (Tikosyn) - Initial Consult Assessment and Electrolyte Replacement  Pharmacy consulted to assist in monitoring and replacing electrolytes in this 69 y.o. male admitted on 05/14/2020 undergoing dofetilide initiation. First dofetilide dose: 05/14/20  Assessment:  Patient Exclusion Criteria: If any screening criteria checked as "Yes", then  patient  should NOT receive dofetilide until criteria item is corrected.  If "Yes" please indicate correction plan.  YES  NO Patient  Exclusion Criteria Correction Plan   [x]   []   Baseline QTc interval is greater than or equal to 440 msec. IF above YES box checked dofetilide contraindicated unless patient has ICD; then may proceed if QTc 500-550 msec or with known ventricular conduction abnormalities may proceed with QTc 550-600 msec. QTc = 496ms Prior Qtc was 453ms, will follow closely   []   [x]   Patient is known or suspected to have a digoxin level greater than 2 ng/ml: No results found for: DIGOXIN     []   [x]   Creatinine clearance less than 20 ml/min (calculated using Cockcroft-Gault, actual body weight and serum creatinine): Estimated Creatinine Clearance: 96.2 mL/min (by C-G formula based on SCr of 1.17 mg/dL).     []   [x]  Patient has received drugs known to prolong the QT intervals within the last 48 hours (phenothiazines, tricyclics or tetracyclic antidepressants, erythromycin, H-1 antihistamines, cisapride, fluoroquinolones, azithromycin). Updated information on QT prolonging agents is available to be searched on the following database:QT prolonging agents     []   [x]   Patient received a dose of hydrochlorothiazide (Oretic) alone or in any combination including triamterene (Dyazide, Maxzide) in the last 48 hours.    []   [x]  Patient received a medication known to increase dofetilide plasma concentrations prior to initial dofetilide dose:  . Trimethoprim (Primsol, Proloprim) in the last 36 hours . Verapamil (Calan,  Verelan) in the last 36 hours or a sustained release dose in the last 72 hours . Megestrol (Megace) in the last 5 days  . Cimetidine (Tagamet) in the last 6 hours . Ketoconazole (Nizoral) in the last 24 hours . Itraconazole (Sporanox) in the last 48 hours  . Prochlorperazine (Compazine) in the last 36 hours     []   [x]   Patient is known to have a history of torsades de pointes; congenital or acquired long QT syndromes.    []   [x]   Patient has received a Class 1 antiarrhythmic with less than 2 half-lives since last dose. (Disopyramide, Quinidine, Procainamide, Lidocaine, Mexiletine, Flecainide, Propafenone)    []   [x]   Patient has received amiodarone therapy in the past 3 months or amiodarone level is greater than 0.3 ng/ml.    Patient has been appropriately anticoagulated with xarelto.  Labs:    Component Value Date/Time   K 4.2 05/14/2020 1141   MG 1.9 05/14/2020 1141     Plan: Potassium: K >/= 4: Appropriate to initiate Tikosyn, no replacement needed    Magnesium: Mg 1.8-2: Give Mg 2 gm IV x1 to prevent Mg from dropping below 1.8 - do not need to recheck Mg. Appropriate to initiate Tikosyn   Thank you for allowing pharmacy to participate in this patient's care   Erin Hearing PharmD., BCPS Clinical Pharmacist 05/14/2020 2:43 PM

## 2020-05-14 NOTE — Progress Notes (Addendum)
Primary Care Physician: [name], MD Referring Physician:Dr. [name], MD   Christopher Hanson is a 69 y.o. male with a h/o HTN, BPH, sleep apnea, obesity,GERD, hyperlipidemia that was found to be in afib, rate at 103 and asymptomatic, at routine PCP visit  3/18. He was already on metoprolol and this dose was doubled yesterday. Today HR in the 60's in afib   He was also started on anticoagulation, Xarelto 20 mg daily, with a CHA2DS2VASc score of 2. Denies  bleeding history. He has had both covid shots, last one 1-2 weeks ago.   He drinks moderate caffeine, does not use tobacco, minimal alcohol. Wife states significant snoring with apnea. Has not had a sleep study in the past. He had labs drawn at PCP and heard last night labs were WNL. He has a BMI of 51.63, weight of 370 lbs.  He was going to gym regularly before covid, got a rowing machine afterward, hurt his back and is now not doing any regular exercise. He has gained back around 36 lbs.  F/u in afib clinic,4/2, he remains in rate controlled afib, asymptomatic. Fluid status is stable. He has not missed any anticoagulation since started 3/19. He feels well. Echo showed  EF of 55-65%, with moderate LVH and severely dilated Left atrium. He would be eligible for a cardioversion after 4/9, if no missed DOAC, but he would like to wait until he hears about the date set for sleep study with his appointment with Dr. Maxwell Caul set for 4/8.   He is now back in afib clinic, 5/12,  had positive  sleep study and has been on cpap for 2 nights. He wants to proceed with cardioversion. Has been on anticoagulation for over 2 weeks, no missed doses.   F/u in afib clinic 5/26. Unfortunately, DCCV was not successful.  It was not entirely surprising as pt has a severely dilated left atrium and his morbid  obesity is also contributing. He has been on cpap now x  2 weeks.   Afib clinic, 6/15, He is here for tikosyn admit. No benadryl products, no missed  anticoagulation. He was taken off  Triamterene/HCTZ on 6/12 , as it could enhance levels of tikosyn, and started on lisinopril 10 mg with controlled BP.    Today, he denies symptoms of palpitations, chest pain, shortness of breath, orthopnea, PND, lower extremity edema, dizziness, presyncope, syncope, or neurologic sequela. The patient is tolerating medications without difficulties and is otherwise without complaint today.   Past Medical History:  Diagnosis Date  . GERD (gastroesophageal reflux disease)   . Hyperlipemia   . Hypertension   . Seasonal allergies   . Wears glasses    Past Surgical History:  Procedure Laterality Date  . CARDIOVERSION N/A 04/17/2020   Procedure: CARDIOVERSION;  Surgeon: [name], MD;  Location: [facility];  Service: Cardiovascular;  Laterality: N/A;  . CARPAL [place]     right and left  . COLONOSCOPY    . CYST EXCISION     face  . EAR CYST EXCISION Left 02/11/2015   Procedure: EXCISION OF LEFT POST AURICULAR CYST;  Surgeon: [name], MD;  Location: [facility];  Service: ENT;  Laterality: Left;  . INGUINAL HERNIA REPAIR     bilat-as infant  . NASAL SINUS SURGERY  2000    Current Outpatient Medications  Medication Sig Dispense Refill  . cetirizine (ZYRTEC) 10 MG tablet Taking one tablet by mouth daily    .  Cholecalciferol (VITAMIN D3) 50 MCG (2000 UT) TABS Take 2,000 Units by mouth daily.    [name] ezetimibe (ZETIA) 10 MG tablet Take 10 mg by mouth at bedtime.     [name] lisinopril (ZESTRIL) 10 MG tablet Take 1 tablet (10 mg total) by mouth daily. Start on 05/11/20 90 tablet 3  . metoprolol succinate (TOPROL-XL) 100 MG 24 hr tablet Take 100 mg by mouth daily.    . Multiple Minerals-Vitamins (CALCIUM-MAGNESIUM-ZINC-D3 PO) Take 333 mg by mouth daily.    . Multiple Vitamins-Minerals (MULTIVITAMIN WITH MINERALS) tablet Take 1 tablet by mouth daily. Mens 50 +    . omeprazole (PRILOSEC) 20 MG capsule Take 20 mg by mouth daily.    .  rivaroxaban (XARELTO) 20 MG TABS tablet Take 1 tablet (20 mg total) by mouth daily with supper. (Patient taking differently: Take 20 mg by mouth at bedtime. ) 30 tablet 3  . simvastatin (ZOCOR) 40 MG tablet Take 40 mg by mouth at bedtime.    . tamsulosin (FLOMAX) 0.4 MG CAPS capsule Take 0.4 mg by mouth in the morning and at bedtime.      No current facility-administered medications for this encounter.    No Known Allergies  Social History   Socioeconomic History  . Marital status: Married    Spouse name: Not on file  . Number of children: Not on file  . Years of education: Not on file  . Highest education level: Not on file  Occupational History  . Not on file  Tobacco Use  . Smoking status: Never Smoker  . Smokeless tobacco: Never Used  Substance and Sexual Activity  . Alcohol use: Yes    Comment: Rarely- one drink ,2 glasses occasional  . Drug use: No  . Sexual activity: Not on file  Other Topics Concern  . Not on file  Social History Narrative  . Not on file   Social Determinants of Health   Financial Resource Strain:   . Difficulty of Paying Living Expenses:   Food Insecurity:   . Worried About Charity fundraiser in the Last Year:   . Arboriculturist in the Last Year:   Transportation Needs:   . Film/video editor (Medical):   [name] Lack of Transportation (Non-Medical):   Physical Activity:   . Days of Exercise per Week:   . Minutes of Exercise per Session:   Stress:   . Feeling of Stress :   Social Connections:   . Frequency of Communication with Friends and Family:   . Frequency of Social Gatherings with Friends and Family:   . Attends Religious Services:   . Active Member of Clubs or Organizations:   . Attends Archivist Meetings:   [name] Marital Status:   Intimate Partner Violence:   . Fear of Current or Ex-Partner:   . Emotionally Abused:   [name] Physically Abused:   . Sexually Abused:     No family history on file.  ROS- All systems are  reviewed and negative except as per the HPI above  Physical Exam: Vitals:   05/14/20 1154  BP: 120/70  Pulse: 89  Weight: (!) 168.5 kg  Height: 5\' 11"  (1.803 m)   Wt Readings from Last 3 Encounters:  05/14/20 (!) 168.5 kg  04/24/20 (!) 160.8 kg  04/10/20 (!) 162.4 kg    Labs: Lab Results  Component Value Date   NA 143 05/14/2020   K 4.2 05/14/2020   CL 109 05/14/2020  CO2 26 05/14/2020   GLUCOSE 72 05/14/2020   BUN 14 05/14/2020   CREATININE 1.17 05/14/2020   CALCIUM 9.1 05/14/2020   MG 1.9 05/14/2020   No results found for: INR No results found for: CHOL, HDL, LDLCALC, TRIG   GEN- The patient is well appearing, alert and oriented x 3 today.   Head- normocephalic, atraumatic Eyes-  Sclera clear, conjunctiva pink Ears- hearing intact Oropharynx- clear Neck- supple, no JVP Lymph- no cervical lymphadenopathy Lungs- Clear to ausculation bilaterally, normal work of breathing Heart- irregular rate and rhythm, no murmurs, rubs or gallops, PMI not laterally displaced GI- soft, NT, ND, + BS Extremities- no clubbing, cyanosis, or edema MS- no significant deformity or atrophy Skin- no rash or lesion Psych- euthymic mood, full affect Neuro- strength and sensation are intact  EKG- afib at 89 bpm, qrs int 94 ms, qtc 450 ms ( prior ekg 405 ms)  Echo-1. Left ventricular ejection fraction, by estimation, is 55 to 60%. The  left ventricle has normal function. The left ventricle has no regional  wall motion abnormalities. There is moderate left ventricular hypertrophy.  Left ventricular diastolic  parameters are indeterminate.  2. Right ventricular systolic function is normal. The right ventricular  size is normal. There is mildly elevated pulmonary artery systolic  pressure.  3. Left atrial size was severely dilated.  4. Right atrial size was moderately dilated.  5. The mitral valve is normal in structure. Trivial mitral valve  regurgitation. No evidence of mitral  stenosis.  6. The aortic valve is tricuspid. Aortic valve regurgitation is not  visualized. No aortic stenosis is present.  7. The inferior vena cava is dilated in size with >50% respiratory  variability, suggesting right atrial pressure of 8 mmHg.    Assessment and Plan: 1. New onset afib Dx in March at time of physical  Asymptomatic  Rate controlled  Cardioversion was unsuccessful  Her for tikosyn admit  Continue metoprolol 100 mg qd  Still stressed to pt that it may be difficult to restore and maintain SR even with antiarrythmic  He has had  both covid shots and covid negative  He is off triamterene and placed on lisinpril with good response with BP qtc looks acceptable  Aware of the price of the drug   2.CHA2DS2VASc score of 2( age/htn) Started on xarelto 20 mg daily  (3/18) with Dr. Tamala Julian No  missed doses since start of med  3.Lifstyle issues/triggers He has cut back on caffeine Regular exercise and weight loss encouraged He has lost over 10 lbs by tracking food/calories, he hopes to get back in gym when SR is restored  Sleep study done  positive and continues on cpap  Bmet/mag show acceptable levels for admission K+ 4.2 and mag at 1.9 crcl cal at 500 mcg bid   Butch Penny C. Gurdeep Keesey, [facility] [address] [place], [place] [address] [phone]

## 2020-05-14 NOTE — H&P (Signed)
Primary Care Physician: [name], MD Referring Physician:Dr. [name], MD   Christopher Hanson is a 69 y.o. male with a h/o HTN, BPH, sleep apnea, obesity,GERD, hyperlipidemia that was found to be in afib, rate at 103 and asymptomatic, at routine PCP visit  3/18. He was already on metoprolol and this dose was doubled yesterday. Today HR in the 60's in afib   He was also started on anticoagulation, Xarelto 20 mg daily, with a CHA2DS2VASc score of 2. Denies  bleeding history. He has had both covid shots, last one 1-2 weeks ago.   He drinks moderate caffeine, does not use tobacco, minimal alcohol. Wife states significant snoring with apnea. Has not had a sleep study in the past. He had labs drawn at PCP and heard last night labs were WNL. He has a BMI of 51.63, weight of 370 lbs.  He was going to gym regularly before covid, got a rowing machine afterward, hurt his back and is now not doing any regular exercise. He has gained back around 36 lbs.  F/u in afib clinic,4/2, he remains in rate controlled afib, asymptomatic. Fluid status is stable. He has not missed any anticoagulation since started 3/19. He feels well. Echo showed  EF of 55-65%, with moderate LVH and severely dilated Left atrium. He would be eligible for a cardioversion after 4/9, if no missed DOAC, but he would like to wait until he hears about the date set for sleep study with his appointment with Dr. Maxwell Caul set for 4/8.   He is now back in afib clinic, 5/12,  had positive  sleep study and has been on cpap for 2 nights. He wants to proceed with cardioversion. Has been on anticoagulation for over 2 weeks, no missed doses.   F/u in afib clinic 5/26. Unfortunately, DCCV was not successful.  It was not entirely surprising as pt has a severely dilated left atrium and his morbid  obesity is also contributing. He has been on cpap now x  2 weeks.   Afib clinic, 6/15, He is here for tikosyn admit. No benadryl products, no missed  anticoagulation. He was taken off  Triamterene/HCTZ on 6/12 , as it could enhance levels of tikosyn, and started on lisinopril 10 mg with controlled BP.    Today, he denies symptoms of palpitations, chest pain, shortness of breath, orthopnea, PND, lower extremity edema, dizziness, presyncope, syncope, or neurologic sequela. The patient is tolerating medications without difficulties and is otherwise without complaint today.   Past Medical History:  Diagnosis Date  . GERD (gastroesophageal reflux disease)   . Hyperlipemia   . Hypertension   . Seasonal allergies   . Wears glasses    Past Surgical History:  Procedure Laterality Date  . CARDIOVERSION N/A 04/17/2020   Procedure: CARDIOVERSION;  Surgeon: [name], MD;  Location: [facility];  Service: Cardiovascular;  Laterality: N/A;  . CARPAL [place]     right and left  . COLONOSCOPY    . CYST EXCISION     face  . EAR CYST EXCISION Left 02/11/2015   Procedure: EXCISION OF LEFT POST AURICULAR CYST;  Surgeon: [name], MD;  Location: [facility];  Service: ENT;  Laterality: Left;  . INGUINAL HERNIA REPAIR     bilat-as infant  . NASAL SINUS SURGERY  2000    Current Facility-Administered Medications  Medication Dose Route Frequency Provider Last Rate Last Admin  . 0.9 %  sodium chloride infusion  250 mL Intravenous  PRN Sherran Needs, NP      . dofetilide [facility]) capsule 500 mcg  500 mcg Oral BID Sherran Needs, NP      . ezetimibe (ZETIA) tablet 10 mg  10 mg Oral QHS Sherran Needs, NP      . Derrill Memo ON 05/15/2020] lisinopril (ZESTRIL) tablet 10 mg  10 mg Oral Daily Sherran Needs, NP      . Derrill Memo ON 05/15/2020] loratadine (CLARITIN) tablet 10 mg  10 mg Oral Daily Sherran Needs, NP      . Derrill Memo ON 05/15/2020] metoprolol succinate (TOPROL-XL) 24 hr tablet 100 mg  100 mg Oral Daily Sherran Needs, NP      . rivaroxaban [facility]ved[facility] Reasons) tablet 20 mg  20 mg Oral Q supper Sherran Needs, NP      .  simvastatin (ZOCOR) tablet 40 mg  40 mg Oral QHS Roderic Palau C, NP      . sodium chloride flush (NS) 0.9 % injection 3 mL  3 mL Intravenous Q12H Sherran Needs, NP   3 mL at 05/14/20 1352  . sodium chloride flush (NS) 0.9 % injection 3 mL  3 mL Intravenous PRN Sherran Needs, NP      . tamsulosin (FLOMAX) capsule 0.4 mg  0.4 mg Oral QPC supper Sherran Needs, NP        No Known Allergies  Social History   Socioeconomic History  . Marital status: Married    Spouse name: Not on file  . Number of children: Not on file  . Years of education: Not on file  . Highest education level: Not on file  Occupational History  . Not on file  Tobacco Use  . Smoking status: Never Smoker  . Smokeless tobacco: Never Used  Substance and Sexual Activity  . Alcohol use: Yes    Comment: Rarely- one drink ,2 glasses occasional  . Drug use: No  . Sexual activity: Not on file  Other Topics Concern  . Not on file  Social History Narrative  . Not on file   Social Determinants of Health   Financial Resource Strain:   . Difficulty of Paying Living Expenses:   Food Insecurity:   . Worried About Charity fundraiser in the Last Year:   . Arboriculturist in the Last Year:   Transportation Needs:   . Film/video editor (Medical):   [name] Lack of Transportation (Non-Medical):   Physical Activity:   . Days of Exercise per Week:   . Minutes of Exercise per Session:   Stress:   . Feeling of Stress :   Social Connections:   . Frequency of Communication with Friends and Family:   . Frequency of Social Gatherings with Friends and Family:   . Attends Religious Services:   . Active Member of Clubs or Organizations:   . Attends Archivist Meetings:   [name] Marital Status:   Intimate Partner Violence:   . Fear of Current or Ex-Partner:   . Emotionally Abused:   [name] Physically Abused:   . Sexually Abused:     No family history on file.  ROS- All systems are reviewed and negative except as  per the HPI above  Physical Exam: Vitals:   05/14/20 1339  BP: (!) 137/99  Pulse: 93  Resp: 18  Temp: 98.4 F (36.9 C)  TempSrc: Oral  SpO2: 99%   Wt Readings from Last 3 Encounters:  05/14/20 (!) 168.5  kg  04/24/20 (!) 160.8 kg  04/10/20 (!) 162.4 kg    Labs: Lab Results  Component Value Date   NA 143 05/14/2020   K 4.2 05/14/2020   CL 109 05/14/2020   CO2 26 05/14/2020   GLUCOSE 72 05/14/2020   BUN 14 05/14/2020   CREATININE 1.17 05/14/2020   CALCIUM 9.1 05/14/2020   MG 1.9 05/14/2020   No results found for: INR No results found for: CHOL, HDL, LDLCALC, TRIG   GEN- The patient is well appearing, alert and oriented x 3 today.   Head- normocephalic, atraumatic Eyes-  Sclera clear, conjunctiva pink Ears- hearing intact Oropharynx- clear Neck- supple, no JVP Lymph- no cervical lymphadenopathy Lungs- Clear to ausculation bilaterally, normal work of breathing Heart- irregular rate and rhythm, no murmurs, rubs or gallops, PMI not laterally displaced GI- soft, NT, ND, + BS Extremities- no clubbing, cyanosis, or edema MS- no significant deformity or atrophy Skin- no rash or lesion Psych- euthymic mood, full affect Neuro- strength and sensation are intact  EKG- afib at 89 bpm, qrs int 94 ms, qtc 450 ms ( prior ekg 405 ms)  Echo-1. Left ventricular ejection fraction, by estimation, is 55 to 60%. The  left ventricle has normal function. The left ventricle has no regional  wall motion abnormalities. There is moderate left ventricular hypertrophy.  Left ventricular diastolic  parameters are indeterminate.  2. Right ventricular systolic function is normal. The right ventricular  size is normal. There is mildly elevated pulmonary artery systolic  pressure.  3. Left atrial size was severely dilated.  4. Right atrial size was moderately dilated.  5. The mitral valve is normal in structure. Trivial mitral valve  regurgitation. No evidence of mitral stenosis.  6.  The aortic valve is tricuspid. Aortic valve regurgitation is not  visualized. No aortic stenosis is present.  7. The inferior vena cava is dilated in size with >50% respiratory  variability, suggesting right atrial pressure of 8 mmHg.    Assessment and Plan: 1. New onset afib Dx in March at time of physical  Asymptomatic  Rate controlled  Cardioversion was unsuccessful  Her for tikosyn admit  Continue metoprolol 100 mg qd  Still stressed to pt that it may be difficult to restore and maintain SR even with antiarrythmic  He has had  both covid shots and covid negative  He is off triamterene and placed on lisinpril with good response with BP qtc looks acceptable  Aware of the price of the drug   2.CHA2DS2VASc score of 2( age/htn) Started on xarelto 20 mg daily  (3/18) with Dr. Tamala Julian No  missed doses since start of med  3.Lifstyle issues/triggers He has cut back on caffeine Regular exercise and weight loss encouraged He has lost over 10 lbs by tracking food/calories, he hopes to get back in gym when SR is restored  Sleep study done  positive and continues on cpap  Bmet/mag show acceptable levels for admission K+ 4.2 and mag at 1.9 crcl cal at 500 mcg bid   Butch Penny C. Carroll, [facility] [address] [place], [place] [address] [phone]  I have seen, examined the patient, and reviewed the above assessment and plan.  Changes to above are made where necessary.  On exam, iRRR.  The patient has minimally symptomatic afib in the setting of morbid obesity.  We discussed lifestyle modification at length.  He reports compliance with anticoagulation without interruption. We will admit for initiation of tikosyn. If  he fails tikosyn, he would probably do reasonably well with rate control as a long term strategy.  Co Sign: [name], MD 05/14/2020 5:55 PM

## 2020-05-15 ENCOUNTER — Encounter (HOSPITAL_COMMUNITY): Payer: Self-pay | Admitting: Internal Medicine

## 2020-05-15 DIAGNOSIS — I10 Essential (primary) hypertension: Secondary | ICD-10-CM

## 2020-05-15 LAB — BASIC METABOLIC PANEL
Anion gap: 6 (ref 5–15)
BUN: 17 mg/dL (ref 8–23)
CO2: 25 mmol/L (ref 22–32)
Calcium: 8.7 mg/dL — ABNORMAL LOW (ref 8.9–10.3)
Chloride: 109 mmol/L (ref 98–111)
Creatinine, Ser: 0.92 mg/dL (ref 0.61–1.24)
GFR calc Af Amer: >60 mL/min (ref >60)
GFR calc non Af Amer: >60 mL/min (ref >60)
Glucose, Bld: 98 mg/dL (ref 70–99)
Potassium: 3.9 mmol/L (ref 3.5–5.1)
Sodium: 140 mmol/L (ref 135–145)

## 2020-05-15 LAB — MAGNESIUM: Magnesium: 2.2 mg/dL (ref 1.7–2.4)

## 2020-05-15 MED ORDER — POTASSIUM CHLORIDE CRYS ER 20 MEQ PO TBCR
30.0000 meq | EXTENDED_RELEASE_TABLET | Freq: Once | ORAL | Status: DC
  Administered 2020-05-15: 30 meq via ORAL
  Filled 2020-05-15: qty 1

## 2020-05-15 MED ORDER — POTASSIUM CHLORIDE CRYS ER 20 MEQ PO TBCR: 40.0000 meq | EXTENDED_RELEASE_TABLET | Freq: Once | ORAL | Status: AC

## 2020-05-15 MED ORDER — TAMSULOSIN HCL 0.4 MG PO CAPS
0.4000 mg | ORAL_CAPSULE | Freq: Every day | ORAL | Status: DC
  Administered 2020-05-15 – 2020-05-17 (×3): 0.4 mg via ORAL
  Filled 2020-05-15 (×3): qty 1

## 2020-05-15 MED ORDER — POTASSIUM CHLORIDE CRYS ER 10 MEQ PO TBCR
10.0000 meq | EXTENDED_RELEASE_TABLET | Freq: Once | ORAL | Status: AC
  Administered 2020-05-15: 10 meq via ORAL
  Filled 2020-05-15: qty 1

## 2020-05-15 NOTE — Progress Notes (Addendum)
Progress Note  Patient Name: Christopher Hanson Date of Encounter: 05/15/2020  [facility] Cardiologist: new to [facility] EP: Dr. Rayann Hanson (new)  Subjective   Feels well.  No complaints  Inpatient Medications    Scheduled Meds: . dofetilide  500 mcg Oral BID  . ezetimibe  10 mg Oral QHS  . lisinopril  10 mg Oral Daily  . loratadine  10 mg Oral Daily  . metoprolol succinate  100 mg Oral Daily  . rivaroxaban  20 mg Oral Q supper  . simvastatin  40 mg Oral QHS  . sodium chloride flush  3 mL Intravenous Q12H  . tamsulosin  0.4 mg Oral QPC supper   Continuous Infusions: . sodium chloride     PRN Meds: sodium chloride, sodium chloride flush   Vital Signs    Vitals:   05/14/20 1339 05/14/20 1954 05/15/20 0002 05/15/20 0412  BP: (!) 137/99 108/77 110/68 94/62  Pulse: 93 74 72 78  Resp: 18 18 18 18   Temp: 98.4 F (36.9 C) 98.9 F (37.2 C) 98 F (36.7 C) 98 F (36.7 C)  TempSrc: Oral Oral Oral Oral  SpO2: 99% 99% 99% 99%  Weight:    (!) 167.3 kg    Intake/Output Summary (Last 24 hours) at 05/15/2020 7412 Last data filed at 05/14/2020 1800 Gross per 24 hour  Intake 420 ml  Output --  Net 420 ml   Last 3 Weights 05/15/2020 05/14/2020 04/24/2020  Weight (lbs) 368 lb 12.8 oz 371 lb 6.4 oz 354 lb 6.4 oz  Weight (kg) 167.287 kg 168.466 kg 160.755 kg      Telemetry    AFib 60's - Personally Reviewed  ECG    AFib 68bpm, QTc looks OK - Personally Reviewed  Physical Exam   GEN: No acute distress.   Neck: No JVD Cardiac: irreg-irreg, no murmurs, rubs, or gallops.  Respiratory: CTA b/l. GI: Soft, nontender, non-distended, obese MS: No edema; No deformity. Neuro:  Nonfocal  Psych: Normal affect   Labs    High Sensitivity Troponin:  No results for input(s): TROPONINIHS in the last 720 hours.    Chemistry Recent Labs  Lab 05/14/20 1141 05/15/20 0419  NA 143 140  K 4.2 3.9  CL 109 109  CO2 26 25  GLUCOSE 72 98  BUN 14 17  CREATININE 1.17 0.92  CALCIUM 9.1  8.7*  GFRNONAA >60 >60  GFRAA >60 >60  ANIONGAP 8 6     HematologyNo results for input(s): WBC, RBC, HGB, HCT, MCV, MCH, MCHC, RDW, PLT in the last 168 hours.  BNPNo results for input(s): BNP, PROBNP in the last 168 hours.   DDimer No results for input(s): DDIMER in the last 168 hours.   Radiology    No results found.  Cardiac Studies   Echo-1. Left ventricular ejection fraction, by estimation, is 55 to 60%. The  left ventricle has normal function. The left ventricle has no regional  wall motion abnormalities. There is moderate left ventricular hypertrophy.  Left ventricular diastolic  parameters are indeterminate.  2. Right ventricular systolic function is normal. The right ventricular  size is normal. There is mildly elevated pulmonary artery systolic  pressure.  3. Left atrial size was severely dilated.  4. Right atrial size was moderately dilated.  5. The mitral valve is normal in structure. Trivial mitral valve  regurgitation. No evidence of mitral stenosis.  6. The aortic valve is tricuspid. Aortic valve regurgitation is not  visualized. No aortic stenosis is present.  7. The inferior vena cava is dilated in size with >50% respiratory  variability, suggesting right atrial pressure of 8 mmHg.   Patient Profile     69 y.o. male with a h/o HTN, BPH, sleep apnea, obesity,GERD, hyperlipidemia and AFib admitted for Tikosyn initiation  Assessment & Plan    1. Persistent AFib     CHA2DS2Vasc is 2, on Xarelto, appropriately dosed     Tikosyn load in progress     K+ 3.9, replaced     Mag 2.2     Creat 0.92     QTc is difficult to assess in Afib, looks stable  DCCV tomorrow if not in SR Patient is aware and agreeable   2. HTN     Looks ok    For questions or updates, please contact Christopher Hanson Please consult [email or web address] for contact info under        Signed, Christopher Jamaica, PA-C  05/15/2020, 6:32 AM     I have seen, examined the patient, and  reviewed the above assessment and plan.  Changes to above are made where necessary.  On exam, iRRR.  Remains in afib.  Will plan cardioversion tomorrow.  Risks of procedure discussed with the patient who wishes to proceed.  Co Sign: Christopher Grayer, MD

## 2020-05-15 NOTE — TOC Benefit Eligibility Note (Signed)
Transition of Care [facility]) Benefit Eligibility Note    Patient Details  Name: Markel Kurtenbach MRN: [id number] Date of Birth: [birth year]   Medication/Dose: DOFETILIDE 125 MCG BID  CO-PAY- $144.26   DOFETILIDE 250 MCG BID CO-PAY- $ 144.26    DOFETILIDE 500 MCG BID CO-PAY- $144.26  Covered?: Yes  Tier:  (TIER- 4 DRUG)  Prescription Coverage Preferred Pharmacy: [facility]  Spoke with Person/Company/Phone Number:: BEN   @    [facility] RX # [phone] OPT- 2  Co-Pay: $ 144.26     Deductible: Met (OUT-OF-POCKET: UNMET)  Additional Notes: TIKOSYN  125 MCG BID  250 MCG  BID    500 MCG BID : NOT COVER  P/A -YES # [phone]    Memory Argue Phone Number: 05/15/2020, 5:12 PM

## 2020-05-15 NOTE — Care Management (Signed)
05-15-20 1827 Tikosyn Load! Patient is aware of cost. Patient wants to use [facility] and get a paper Rx for Tikosyn-generic that he will take to [facility]. Patient wants refills sent to [facility] and [facility]. Thanks Bethena Roys, RN,BSN Case Manager [phone]

## 2020-05-15 NOTE — Progress Notes (Signed)
Pharmacy: Dofetilide (Tikosyn) - Follow Up Assessment and Electrolyte Replacement  Pharmacy consulted to assist in monitoring and replacing electrolytes in this 69 y.o. male admitted on 05/14/2020 undergoing dofetilide initiation. First dofetilide dose: 05/14/20  Labs:    Component Value Date/Time   K 3.9 05/15/2020 0419   MG 2.2 05/15/2020 0419     Plan: Potassium: K 3.8-3.9:  Give KCl 40 mEq po x1   Magnesium: Mg > 2: No additional supplementation needed   Will supplement potassium this am, will reassess ongoing supplementation in am.   Thank you for allowing pharmacy to participate in this patient's care   Erin Hearing PharmD., BCPS Clinical Pharmacist 05/15/2020 8:02 AM

## 2020-05-15 NOTE — Progress Notes (Signed)
Patient places self on CPAP.  RT assistance not needed at this time. 

## 2020-05-15 NOTE — Plan of Care (Signed)
  Problem: Education: Goal: Knowledge of disease or condition will improve Outcome: Progressing   Problem: Education: Goal: Understanding of medication regimen will improve Outcome: Completed/Met   Problem: Health Behavior/Discharge Planning: Goal: Ability to safely manage health-related needs after discharge will improve Outcome: Completed/Met

## 2020-05-16 ENCOUNTER — Encounter (HOSPITAL_COMMUNITY): Payer: Self-pay | Admitting: Internal Medicine

## 2020-05-16 ENCOUNTER — Inpatient Hospital Stay (HOSPITAL_COMMUNITY): Payer: Medicare Other | Admitting: Certified Registered Nurse Anesthetist

## 2020-05-16 ENCOUNTER — Encounter (HOSPITAL_COMMUNITY)
Admission: RE | Disposition: A | Discharge status: Home / Self Care | Payer: Self-pay | Source: Ambulatory Visit | Attending: Internal Medicine

## 2020-05-16 HISTORY — PX: CARDIOVERSION: SHX1299

## 2020-05-16 LAB — BASIC METABOLIC PANEL
Anion gap: 9 (ref 5–15)
BUN: 14 mg/dL (ref 8–23)
CO2: 27 mmol/L (ref 22–32)
Calcium: 9.3 mg/dL (ref 8.9–10.3)
Chloride: 108 mmol/L (ref 98–111)
Creatinine, Ser: 1.16 mg/dL (ref 0.61–1.24)
GFR calc Af Amer: >60 mL/min (ref >60)
GFR calc non Af Amer: >60 mL/min (ref >60)
Glucose, Bld: 100 mg/dL — ABNORMAL HIGH (ref 70–99)
Potassium: 4.8 mmol/L (ref 3.5–5.1)
Sodium: 144 mmol/L (ref 135–145)

## 2020-05-16 LAB — PROTIME-INR
INR: 1.4 — ABNORMAL HIGH (ref 0.8–1.2)
Prothrombin Time: 16.7 seconds — ABNORMAL HIGH (ref 11.4–15.2)

## 2020-05-16 LAB — MAGNESIUM: Magnesium: 2.1 mg/dL (ref 1.7–2.4)

## 2020-05-16 SURGERY — CARDIOVERSION
Anesthesia: General

## 2020-05-16 MED ORDER — HYDROCORTISONE 1 % EX CREA
1.0000 "application " | TOPICAL_CREAM | Freq: Three times a day (TID) | CUTANEOUS | Status: DC | PRN
  Filled 2020-05-16: qty 28

## 2020-05-16 MED ORDER — LIDOCAINE 2% (20 MG/ML) 5 ML SYRINGE
INTRAMUSCULAR | Status: DC | PRN
  Administered 2020-05-16: 60 mg via INTRAVENOUS

## 2020-05-16 MED ORDER — PROPOFOL 10 MG/ML IV BOLUS
INTRAVENOUS | Status: DC | PRN
  Administered 2020-05-16: 100 mg via INTRAVENOUS

## 2020-05-16 NOTE — Anesthesia Postprocedure Evaluation (Signed)
ss Anesthesia Post Note  Patient: Christopher Hanson  Procedure(s) Performed: CARDIOVERSION (N/A )     Patient location during evaluation: Endoscopy Anesthesia Type: General Level of consciousness: awake Pain management: pain level controlled Vital Signs Assessment: post-procedure vital signs reviewed and stable Respiratory status: spontaneous breathing Cardiovascular status: stable Postop Assessment: no apparent nausea or vomiting Anesthetic complications: no   No complications documented.  Last Vitals:  Vitals:   05/16/20 1048 05/16/20 1244  BP: 129/70 126/74  Pulse: (!) 59 70  Resp: 13   Temp:    SpO2: 98%     Last Pain:  Vitals:   05/16/20 1048  TempSrc:   PainSc: 0-No pain                 Keondre Markson

## 2020-05-16 NOTE — Progress Notes (Signed)
Pharmacy: Dofetilide (Tikosyn) - Follow Up Assessment and Electrolyte Replacement  Pharmacy consulted to assist in monitoring and replacing electrolytes in this 69 y.o. male admitted on 05/14/2020 undergoing dofetilide initiation. First dofetilide dose: 05/14/20 Plan for DCCV 6/17 if continues in Afib  Patient remains on rivaroxaban 20mg  daily for anticoaulation - cbc ok  Labs:    Component Value Date/Time   K 4.8 05/16/2020 0337   MG 2.1 05/16/2020 0349     Plan: Potassium: K  4.8 no replacement needed  Magnesium: Mag 2.1 no repalacement needed  Bonnita Nasuti Pharm.D. CPP, BCPS Clinical Pharmacist [phone] 05/16/2020 7:20 AM

## 2020-05-16 NOTE — Interval H&P Note (Signed)
History and Physical Interval Note:  05/16/2020 9:53 AM  Christopher Hanson  has presented today for surgery, with the diagnosis of afib.  The various methods of treatment have been discussed with the patient and family. After consideration of risks, benefits and other options for treatment, the patient has consented to  Procedure(s): CARDIOVERSION (N/A) as a surgical intervention.  The patient's history has been reviewed, patient examined, no change in status, stable for surgery.  I have reviewed the patient's chart and labs.  Questions were answered to the patient's satisfaction.     [name]

## 2020-05-16 NOTE — Progress Notes (Signed)
Morning EKG reviewed  Shows has converted to NSR s/p DCCV at 57 bpm with stable QTc at ~460 ms.  Continue Tikosyn 500 mcg BID.   Continue current dose.   Shirley Friar, PA-C  Pager: [phone]  05/16/2020 2:58 PM

## 2020-05-16 NOTE — Progress Notes (Addendum)
Electrophysiology Rounding Note  Patient Name: Christopher Hanson Date of Encounter: 05/16/2020  Primary Cardiologist: No primary care provider on file.  Electrophysiologist: None    Subjective   Pt remains in afib on Tikosyn 500 mcg BID   QTc from EKG last pm shows stable QTc at ~ 470 ms  The patient is doing well today.  At this time, the patient denies chest pain, shortness of breath, or any new concerns.  Inpatient Medications    Scheduled Meds: . dofetilide  500 mcg Oral BID  . ezetimibe  10 mg Oral QHS  . lisinopril  10 mg Oral Daily  . loratadine  10 mg Oral Daily  . metoprolol succinate  100 mg Oral Daily  . rivaroxaban  20 mg Oral Q supper  . simvastatin  40 mg Oral QHS  . sodium chloride flush  3 mL Intravenous Q12H  . tamsulosin  0.4 mg Oral Daily   Continuous Infusions: . sodium chloride     PRN Meds: sodium chloride, hydrocortisone cream, sodium chloride flush   Vital Signs    Vitals:   05/16/20 1034 05/16/20 1035 05/16/20 1036 05/16/20 1048  BP:    129/70  Pulse: 60 64 61 (!) 59  Resp: 17 20 19 13   Temp:   98.1 F (36.7 C)   TempSrc:   Axillary   SpO2: 100% 100% 98% 98%  Weight:      Height:        Intake/Output Summary (Last 24 hours) at 05/16/2020 1201 Last data filed at 05/16/2020 1035 Gross per 24 hour  Intake 1103 ml  Output 0 ml  Net 1103 ml   Filed Weights   05/15/20 0412 05/16/20 0500 05/16/20 0945  Weight: (!) 167.3 kg (!) 164.9 kg (!) 164.9 kg    Physical Exam    GEN- The patient is well appearing, alert and oriented x 3 today.   Head- normocephalic, atraumatic Eyes-  Sclera clear, conjunctiva pink Ears- hearing intact Oropharynx- clear Neck- supple Lungs- Clear to ausculation bilaterally, normal work of breathing Heart- Regular rate and rhythm, no murmurs, rubs or gallops GI- soft, NT, ND, + BS Extremities- no clubbing, cyanosis, or edema Skin- no rash or lesion Psych- euthymic mood, full affect Neuro- strength and  sensation are intact  Labs    CBC No results for input(s): WBC, NEUTROABS, HGB, HCT, MCV, PLT in the last 72 hours. Basic Metabolic Panel Recent Labs    05/15/20 0419 05/16/20 0337  NA 140 144  K 3.9 4.8  CL 109 108  CO2 25 27  GLUCOSE 98 100*  BUN 17 14  CREATININE 0.92 1.16  CALCIUM 8.7* 9.3  MG 2.2 2.1    Potassium  Date/Time Value Ref Range Status  05/16/2020 03:37 AM 4.8 3.5 - 5.1 mmol/L Final   Magnesium  Date/Time Value Ref Range Status  05/16/2020 03:37 AM 2.1 1.7 - 2.4 mg/dL Final    Comment:    Performed at [facility], [place] [address] [place], [place] [address]    Telemetry    Remains in AF in 70-90s (personally reviewed)  Radiology    No results found.   Patient Profile     Christopher Hanson is a 69 y.o. male with a past medical history significant for persistent atrial fibrillation.  They were admitted for tikosyn load.   Assessment & Plan    1. Persistent atrial fibrillation Pt remains in afib on Tikosyn 500 mcg BID  Continue Xarelto Electrolytes stable.  CHA2DS2VASC is at least 2  2. HTN Stable. Follow in sinus.  If pt does not convert chemically, plan on DCCV this am    For questions or updates, please contact [place] Please consult [email or web address] for contact info under Cardiology/STEMI.  Signed, Shirley Friar, PA-C  05/16/2020,     I have seen, examined the patient, and reviewed the above assessment and plan.  Changes to above are made where necessary.  On exam, RRR.  Doing well s/p cardioversion.  Hopefully he will remain in sinus rhythm, though with his morbid obesity and severe LA enlargement, I am not optimistic.  I would advise rate control strategy going forward if he fails to maintain sinus rhythm tikosyn.  HTN is stable. No changes today We discussed importance of weigh loss at length today.  Co Sign: [name], MD 05/16/2020 3:09 PM

## 2020-05-16 NOTE — CV Procedure (Signed)
Procedure:   DCCV  Indication:  Symptomatic atrial fibrillation  Procedure Note:  The patient signed informed consent.  They have had had therapeutic anticoagulation with Xarelto greater than 3 weeks.  Anesthesia was administered by Dr. Nyoka Cowden.  Adequate airway was maintained throughout and vital followed per protocol.  They were cardioverted x 1 with 200J of biphasic synchronized energy.  They converted to NSR with rate 60s.  There were no apparent complications.  The patient had normal neuro status and respiratory status post procedure with vitals stable as recorded elsewhere.    Follow up:  They will continue on current medical therapy and follow up with cardiology as scheduled.  Oswaldo Milian, MD 05/16/2020 10:35 AM

## 2020-05-16 NOTE — Transfer of Care (Signed)
Immediate Anesthesia Transfer of Care Note  Patient: Christopher Hanson  Procedure(s) Performed: CARDIOVERSION (N/A )  Patient Location: PACU and Endoscopy Unit  Anesthesia Type:General  Level of Consciousness: awake and alert   Airway & Oxygen Therapy: Patient Spontanous Breathing  Post-op Assessment: Report given to RN and Post -op Vital signs reviewed and stable  Post vital signs: Reviewed and stable  Last Vitals:  Vitals Value Taken Time  BP    Temp    Pulse 61 05/16/20 1036  Resp 19 05/16/20 1036  SpO2 98 % 05/16/20 1036    Last Pain:  Vitals:   05/16/20 0945  TempSrc: Oral  PainSc: 0-No pain      Patients Stated Pain Goal: 0 (30/09/23 3007)  Complications: No complications documented.

## 2020-05-16 NOTE — Anesthesia Procedure Notes (Addendum)
Procedure Name: MAC Date/Time: 05/16/2020 10:25 AM Performed by: Inda Coke, CRNA Pre-anesthesia Checklist: Patient identified, Emergency Drugs available, Suction available, Timeout performed and Patient being monitored Patient Re-evaluated:Patient Re-evaluated prior to induction Oxygen Delivery Method: Ambu bag Preoxygenation: Pre-oxygenation with 100% oxygen Induction Type: IV induction Dental Injury: Teeth and Oropharynx as per pre-operative assessment

## 2020-05-16 NOTE — Anesthesia Preprocedure Evaluation (Addendum)
Anesthesia Evaluation  Patient identified by MRN, date of birth, ID band Patient awake    Reviewed: Allergy & Precautions, NPO status , Patient's Chart, lab work & pertinent test results  Airway Mallampati: II  TM Distance: >3 FB     Dental   Pulmonary neg pulmonary ROS,    breath sounds clear to auscultation       Cardiovascular hypertension,  Rhythm:Irregular Rate:Normal     Neuro/Psych negative neurological ROS     GI/Hepatic Neg liver ROS, GERD  ,  Endo/Other  negative endocrine ROS  Renal/GU negative Renal ROS     Musculoskeletal   Abdominal   Peds  Hematology   Anesthesia Other Findings   Reproductive/Obstetrics                            Anesthesia Physical Anesthesia Plan  ASA: III  Anesthesia Plan: General   Post-op Pain Management:    Induction: Intravenous  PONV Risk Score and Plan: 2  Airway Management Planned: Simple Face Mask and Nasal Cannula  Additional Equipment:   Intra-op Plan:   Post-operative Plan:   Informed Consent:     Dental advisory given  Plan Discussed with: CRNA and Anesthesiologist  Anesthesia Plan Comments:         Anesthesia Quick Evaluation

## 2020-05-16 NOTE — Anesthesia Postprocedure Evaluation (Signed)
Anesthesia Post Note  Patient: Christopher Hanson  Procedure(s) Performed: CARDIOVERSION (N/A )     Patient location during evaluation: Endoscopy Anesthesia Type: General Level of consciousness: awake Pain management: pain level controlled Respiratory status: spontaneous breathing Cardiovascular status: stable Postop Assessment: no apparent nausea or vomiting Anesthetic complications: no   No complications documented.  Last Vitals:  Vitals:   05/16/20 1035 05/16/20 1036  BP:    Pulse: 64 61  Resp: 20 19  Temp:  36.7 C  SpO2: 100% 98%    Last Pain:  Vitals:   05/16/20 1036  TempSrc: Axillary  PainSc:                  Casey Fye

## 2020-05-17 LAB — BASIC METABOLIC PANEL
Anion gap: 7 (ref 5–15)
BUN: 16 mg/dL (ref 8–23)
CO2: 27 mmol/L (ref 22–32)
Calcium: 8.9 mg/dL (ref 8.9–10.3)
Chloride: 106 mmol/L (ref 98–111)
Creatinine, Ser: 1.03 mg/dL (ref 0.61–1.24)
GFR calc Af Amer: >60 mL/min (ref >60)
GFR calc non Af Amer: >60 mL/min (ref >60)
Glucose, Bld: 100 mg/dL — ABNORMAL HIGH (ref 70–99)
Potassium: 4.1 mmol/L (ref 3.5–5.1)
Sodium: 140 mmol/L (ref 135–145)

## 2020-05-17 LAB — MAGNESIUM: Magnesium: 1.8 mg/dL (ref 1.7–2.4)

## 2020-05-17 MED ORDER — POTASSIUM CHLORIDE CRYS ER 10 MEQ PO TBCR: 10.0000 meq | EXTENDED_RELEASE_TABLET | Freq: Every day | ORAL | 3 refills | Status: DC

## 2020-05-17 MED ORDER — DOFETILIDE 500 MCG PO CAPS: 500.0000 ug | ORAL_CAPSULE | Freq: Two times a day (BID) | ORAL | 6 refills | Status: DC

## 2020-05-17 MED ORDER — MAGNESIUM SULFATE 2 GM/50ML IV SOLN
2.0000 g | Freq: Once | INTRAVENOUS | Status: AC
  Administered 2020-05-17: 2 g via INTRAVENOUS
  Filled 2020-05-17: qty 50

## 2020-05-17 MED ORDER — POTASSIUM CHLORIDE CRYS ER 10 MEQ PO TBCR
10.0000 meq | EXTENDED_RELEASE_TABLET | Freq: Every day | ORAL | Status: DC
  Administered 2020-05-17: 10 meq via ORAL
  Filled 2020-05-17: qty 1

## 2020-05-17 NOTE — Discharge Instructions (Signed)
1. Please pick up your prescriptions at [facility] on [facility] [place], [place] [address] )

## 2020-05-17 NOTE — Discharge Summary (Signed)
ELECTROPHYSIOLOGY PROCEDURE DISCHARGE SUMMARY    Patient ID: Christopher Hanson,  MRN: [id number], DOB/AGE: 1951-10-13 69 y.o.  Admit date: 05/14/2020 Discharge date: 05/17/2020  Primary Care Physician: [name], MD  Primary Cardiologist: No primary care provider on file.  Electrophysiologist: None   Primary Discharge Diagnosis:  1.  Persistent atrial fibrillation status post Tikosyn loading this admission  No Known Allergies   Procedures This Admission:  1.  Tikosyn loading 2.  Direct current cardioversion on Thursday May 16, 2020  by Dr Gardiner Rhyme which successfully restored SR.  There were no early apparent complications.   Brief HPI: Christopher Hanson is a 69 y.o. male with a past medical history as noted above.  They were referred to EP in the outpatient setting for treatment options of atrial fibrillation.  Risks, benefits, and alternatives to Tikosyn were reviewed with the patient who wished to proceed.    Hospital Course:  The patient was admitted and Tikosyn was initiated.  Renal function and electrolytes were followed during the hospitalization.  Their QTc remained stable.  On 05/16/2020 they underwent direct current cardioversion which restored sinus rhythm.  They were monitored until discharge on telemetry which demonstrated NSR post DCCV.  On the day of discharge, they were examined by Dr. Rayann Heman  who considered them stable for discharge to home.  Follow-up has been arranged with the Atrial Fibrillation clinic in approximately 1 week and with Dr. Rayann Heman  in 4 weeks.   Physical Exam: Vitals:   05/16/20 2137 05/17/20 0515 05/17/20 0519 05/17/20 0813  BP: (!) 102/50 (!) 111/55  107/60  Pulse: 60 (!) 59  (!) 58  Resp: 20 18    Temp: 97.8 F (36.6 C) 98.2 F (36.8 C)    TempSrc: Oral Oral    SpO2: 97% 97%    Weight:   (!) 165.5 kg   Height:        GEN- The patient is well appearing, alert and oriented x 3 today.   HEENT: normocephalic, atraumatic; sclera clear,  conjunctiva pink; hearing intact; oropharynx clear; neck supple, no JVP Lymph- no cervical lymphadenopathy Lungs- Clear to ausculation bilaterally, normal work of breathing.  No wheezes, rales, rhonchi Heart- Regular rate and rhythm, no murmurs, rubs or gallops, PMI not laterally displaced GI- soft, non-tender, non-distended, bowel sounds present, no hepatosplenomegaly Extremities- no clubbing, cyanosis, or edema; DP/PT/radial pulses 2+ bilaterally MS- no significant deformity or atrophy Skin- warm and dry, no rash or lesion Psych- euthymic mood, full affect Neuro- strength and sensation are intact   Labs:   Lab Results  Component Value Date   WBC 5.8 04/10/2020   HGB 15.2 04/10/2020   HCT 45.6 04/10/2020   MCV 88.7 04/10/2020   PLT 199 04/10/2020    Recent Labs  Lab 05/17/20 0426  NA 140  K 4.1  CL 106  CO2 27  BUN 16  CREATININE 1.03  CALCIUM 8.9  GLUCOSE 100*     Discharge Medications:  Allergies as of 05/17/2020   No Known Allergies     Medication List    TAKE these medications   CALCIUM-MAGNESIUM-ZINC-D3 PO Take 1 tablet by mouth daily.   cetirizine 10 MG tablet Commonly known as: ZYRTEC Take 10 mg by mouth daily.   dofetilide 500 MCG capsule Commonly known as: TIKOSYN Take 1 capsule (500 mcg total) by mouth 2 (two) times daily.   ezetimibe 10 MG tablet Commonly known as: ZETIA Take 10 mg by mouth at bedtime.   lisinopril  10 MG tablet Commonly known as: ZESTRIL Take 1 tablet (10 mg total) by mouth daily. Start on 05/11/20   metoprolol succinate 100 MG 24 hr tablet Commonly known as: TOPROL-XL Take 100 mg by mouth daily.   omeprazole 20 MG capsule Commonly known as: PRILOSEC Take 20 mg by mouth daily before breakfast.   One-A-Day Mens 50+ Tabs Take 1 tablet by mouth daily with breakfast.   potassium chloride 10 MEQ tablet Commonly known as: KLOR-CON Take 1 tablet (10 mEq total) by mouth daily. Start taking on: May 18, 2020     rivaroxaban 20 MG Tabs tablet Commonly known as: XARELTO Take 1 tablet (20 mg total) by mouth daily with supper. What changed: when to take this   simvastatin 40 MG tablet Commonly known as: ZOCOR Take 40 mg by mouth at bedtime.   tamsulosin 0.4 MG Caps capsule Commonly known as: FLOMAX Take 0.4 mg by mouth in the morning and at bedtime.   Vitamin D3 50 MCG (2000 UT) Tabs Take 2,000 Units by mouth daily.       Disposition:    Follow-up Information    [facility] Follow up on 05/24/2020.   Specialty: Cardiology Why: At 1030 am for post hospital tikosyn follow up Contact information: [address] [id number] [place] [address] [phone]       Shirley Friar, PA-C Follow up on 06/12/2020.   Specialty: Physician Assistant Why: at 1235 for follow up with Dr. Jackalyn Lombard PA post [facility] admission Contact information: [place] [address] [phone]               Duration of Discharge Encounter: Greater than 30 minutes including physician time.  Jacalyn Lefevre, PA-C  05/17/2020 1:18 PM

## 2020-05-17 NOTE — Progress Notes (Signed)
Evening EKG reviewed  Shows remains in NSR at 56 bpm with stable QTc at ~470 ms.  Continue Tikosyn 500 mcg BID.   Full note pending disposition. Home today if QTc remains stable   Annamaria Helling  Pager: [phone]  05/17/2020 10:13 AM

## 2020-05-17 NOTE — Progress Notes (Signed)
Pharmacy: Dofetilide (Tikosyn) - Follow Up Assessment and Electrolyte Replacement  Pharmacy consulted to assist in monitoring and replacing electrolytes in this 69 y.o. male admitted on 05/14/2020 undergoing dofetilide initiation. First dofetilide dose: 05/14/20 S/P  DCCV 6/17 Afib > SR 60s   Patient remains on rivaroxaban 20mg  daily for anticoaulation - cbc ok   Labs:    Component Value Date/Time   K 4.1 05/17/2020 0426   MG 1.8 05/17/2020 0426     Plan: Potassium: K  43.9>KCL 30MEQ> 4.8 > 4.1 will start KCL 41meq daily to keep K >4  Magnesium: Mag 1.8 replace 2gmIV x1   Bonnita Nasuti Pharm.D. CPP, BCPS Clinical Pharmacist [phone] 05/17/2020 9:07 AM

## 2020-05-24 ENCOUNTER — Encounter (HOSPITAL_COMMUNITY): Payer: Self-pay | Admitting: Nurse Practitioner

## 2020-05-24 ENCOUNTER — Other Ambulatory Visit: Payer: Self-pay

## 2020-05-24 ENCOUNTER — Ambulatory Visit (HOSPITAL_COMMUNITY)
Admit: 2020-05-24 | Discharge: 2020-05-24 | Disposition: A | Discharge status: Home / Self Care | Payer: Medicare Other | Attending: Nurse Practitioner | Admitting: Nurse Practitioner

## 2020-05-24 VITALS — BP 108/72 | HR 59 | Ht 71.0 in | Wt 362.8 lb

## 2020-05-24 DIAGNOSIS — K219 Gastro-esophageal reflux disease without esophagitis: Secondary | ICD-10-CM | POA: Diagnosis not present

## 2020-05-24 DIAGNOSIS — I10 Essential (primary) hypertension: Secondary | ICD-10-CM | POA: Diagnosis not present

## 2020-05-24 DIAGNOSIS — E785 Hyperlipidemia, unspecified: Secondary | ICD-10-CM | POA: Diagnosis not present

## 2020-05-24 DIAGNOSIS — I4819 Other persistent atrial fibrillation: Secondary | ICD-10-CM | POA: Diagnosis not present

## 2020-05-24 DIAGNOSIS — I4891 Unspecified atrial fibrillation: Secondary | ICD-10-CM | POA: Diagnosis present

## 2020-05-24 DIAGNOSIS — N400 Enlarged prostate without lower urinary tract symptoms: Secondary | ICD-10-CM | POA: Diagnosis not present

## 2020-05-24 DIAGNOSIS — Z6841 Body Mass Index (BMI) 40.0 and over, adult: Secondary | ICD-10-CM | POA: Insufficient documentation

## 2020-05-24 DIAGNOSIS — Z7901 Long term (current) use of anticoagulants: Secondary | ICD-10-CM | POA: Insufficient documentation

## 2020-05-24 DIAGNOSIS — D6869 Other thrombophilia: Secondary | ICD-10-CM | POA: Diagnosis not present

## 2020-05-24 DIAGNOSIS — Z79899 Other long term (current) drug therapy: Secondary | ICD-10-CM | POA: Insufficient documentation

## 2020-05-24 DIAGNOSIS — G4730 Sleep apnea, unspecified: Secondary | ICD-10-CM | POA: Insufficient documentation

## 2020-05-24 LAB — BASIC METABOLIC PANEL
Anion gap: 9 (ref 5–15)
BUN: 18 mg/dL (ref 8–23)
CO2: 26 mmol/L (ref 22–32)
Calcium: 9.4 mg/dL (ref 8.9–10.3)
Chloride: 105 mmol/L (ref 98–111)
Creatinine, Ser: 1.02 mg/dL (ref 0.61–1.24)
GFR calc Af Amer: >60 mL/min (ref >60)
GFR calc non Af Amer: >60 mL/min (ref >60)
Glucose, Bld: 107 mg/dL — ABNORMAL HIGH (ref 70–99)
Potassium: 4.4 mmol/L (ref 3.5–5.1)
Sodium: 140 mmol/L (ref 135–145)

## 2020-05-24 LAB — MAGNESIUM: Magnesium: 2.1 mg/dL (ref 1.7–2.4)

## 2020-05-24 NOTE — Progress Notes (Signed)
Primary Care Physician: [name], MD Referring Physician:Dr. [name], MD   Christopher Hanson is a 69 y.o. male with a h/o HTN, BPH, sleep apnea, obesity,GERD, hyperlipidemia that was found to be in afib, rate at 103 and asymptomatic, at routine PCP visit  3/18. He was already on metoprolol and this dose was doubled yesterday. Today HR in the 60's in afib   He was also started on anticoagulation, Xarelto 20 mg daily, with a CHA2DS2VASc score of 2. Denies  bleeding history. He has had both covid shots, last one 1-2 weeks ago.   He drinks moderate caffeine, does not use tobacco, minimal alcohol. Wife states significant snoring with apnea. Has not had a sleep study in the past. He had labs drawn at PCP and heard last night labs were WNL. He has a BMI of 51.63, weight of 370 lbs.  He was going to gym regularly before covid, got a rowing machine afterward, hurt his back and is now not doing any regular exercise. He has gained back around 36 lbs.  F/u in afib clinic,4/2, he remains in rate controlled afib, asymptomatic. Fluid status is stable. He has not missed any anticoagulation since started 3/19. He feels well. Echo showed  EF of 55-65%, with moderate LVH and severely dilated Left atrium. He would be eligible for a cardioversion after 4/9, if no missed DOAC, but he would like to wait until he hears about the date set for sleep study with his appointment with Dr. Maxwell Caul set for 4/8.   He is now back in afib clinic, 5/12,  had positive  sleep study and has been on cpap for 2 nights. He wants to proceed with cardioversion. Has been on anticoagulation for over 2 weeks, no missed doses.   F/u in afib clinic 5/26. Unfortunately, DCCV was not successful.  It was not entirely surprising as pt has a severely dilated left atrium and his morbid  obesity is also contributing. He has been on cpap now x  2 weeks.   Afib clinic, 6/15, He is here for tikosyn admit. No benadryl products, no missed  anticoagulation. He was taken off  Triamterene/HCTZ on 6/12 , as it could enhance levels of tikosyn, and started on lisinopril 10 mg with controlled BP.   F/u in afib clinic, 6/25, after Tikosyn admit. He is in SR but does not feel any better. He has had some soft BP's, a tickle type cough and mild lightheadedness since starting lisinopril. This was started since triamterene/hctz was stopped for Tikosyn to start. Qtc  is acceptable .    Today, he denies symptoms of palpitations, chest pain, shortness of breath, orthopnea, PND, lower extremity edema, dizziness, presyncope, syncope, or neurologic sequela. The patient is tolerating medications without difficulties and is otherwise without complaint today.   Past Medical History:  Diagnosis Date  . GERD (gastroesophageal reflux disease)   . Hyperlipemia   . Hypertension   . Seasonal allergies   . Wears glasses    Past Surgical History:  Procedure Laterality Date  . CARDIOVERSION N/A 04/17/2020   Procedure: CARDIOVERSION;  Surgeon: [name], MD;  Location: [facility];  Service: Cardiovascular;  Laterality: N/A;  . CARDIOVERSION N/A 05/16/2020   Procedure: CARDIOVERSION;  Surgeon: [name], MD;  Location: [facility];  Service: Cardiovascular;  Laterality: N/A;  . CARPAL TUNNEL RELEASE     right and left  . COLONOSCOPY    . CYST EXCISION     face  .  EAR CYST EXCISION Left 02/11/2015   Procedure: EXCISION OF LEFT POST AURICULAR CYST;  Surgeon: [name], MD;  Location: [facility];  Service: ENT;  Laterality: Left;  . INGUINAL HERNIA REPAIR     bilat-as infant  . NASAL SINUS SURGERY  2000    Current Outpatient Medications  Medication Sig Dispense Refill  . cetirizine (ZYRTEC) 10 MG tablet Take 10 mg by mouth daily.     . Cholecalciferol (VITAMIN D3) 50 MCG (2000 UT) TABS Take 2,000 Units by mouth daily.    [name] dofetilide (TIKOSYN) 500 MCG capsule Take 1 capsule (500 mcg total) by mouth 2 (two)  times daily. 60 capsule 6  . ezetimibe (ZETIA) 10 MG tablet Take 10 mg by mouth at bedtime.     [name] lisinopril (ZESTRIL) 10 MG tablet Take 1 tablet (10 mg total) by mouth daily. Start on 05/11/20 90 tablet 3  . metoprolol succinate (TOPROL-XL) 100 MG 24 hr tablet Take 100 mg by mouth daily.    . Multiple Minerals-Vitamins (CALCIUM-MAGNESIUM-ZINC-D3 PO) Take 1 tablet by mouth daily.     . Multiple Vitamins-Minerals (ONE-A-DAY MENS 50+) TABS Take 1 tablet by mouth daily with breakfast.    . omeprazole (PRILOSEC) 20 MG capsule Take 20 mg by mouth daily before breakfast.     . potassium chloride (KLOR-CON) 10 MEQ tablet Take 1 tablet (10 mEq total) by mouth daily. 90 tablet 3  . rivaroxaban (XARELTO) 20 MG TABS tablet Take 1 tablet (20 mg total) by mouth daily with supper. (Patient taking differently: Take 20 mg by mouth daily after supper. ) 30 tablet 3  . simvastatin (ZOCOR) 40 MG tablet Take 40 mg by mouth at bedtime.    . tamsulosin (FLOMAX) 0.4 MG CAPS capsule Take 0.4 mg by mouth in the morning and at bedtime.      No current facility-administered medications for this encounter.    No Known Allergies  Social History   Socioeconomic History  . Marital status: Married    Spouse name: Not on file  . Number of children: Not on file  . Years of education: Not on file  . Highest education level: Not on file  Occupational History  . Not on file  Tobacco Use  . Smoking status: Never Smoker  . Smokeless tobacco: Never Used  Vaping Use  . Vaping Use: Never used  Substance and Sexual Activity  . Alcohol use: Yes    Comment: Rarely- one drink ,2 glasses occasional  . Drug use: No  . Sexual activity: Yes  Other Topics Concern  . Not on file  Social History Narrative  . Not on file   Social Determinants of Health   Financial Resource Strain:   . Difficulty of Paying Living Expenses:   Food Insecurity:   . Worried About Charity fundraiser in the Last Year:   . Arboriculturist in the  Last Year:   Transportation Needs:   . Film/video editor (Medical):   [name] Lack of Transportation (Non-Medical):   Physical Activity:   . Days of Exercise per Week:   . Minutes of Exercise per Session:   Stress:   . Feeling of Stress :   Social Connections:   . Frequency of Communication with Friends and Family:   . Frequency of Social Gatherings with Friends and Family:   . Attends Religious Services:   . Active Member of Clubs or Organizations:   . Attends Archivist Meetings:   .  Marital Status:   Intimate Partner Violence:   . Fear of Current or Ex-Partner:   . Emotionally Abused:   [name] Physically Abused:   . Sexually Abused:     No family history on file.  ROS- All systems are reviewed and negative except as per the HPI above  Physical Exam: Vitals:   05/24/20 1037  BP: 108/72  Pulse: (!) 59  Weight: (!) 164.6 kg  Height: 5\' 11"  (1.803 m)   Wt Readings from Last 3 Encounters:  05/24/20 (!) 164.6 kg  05/17/20 (!) 165.5 kg  05/14/20 (!) 168.5 kg    Labs: Lab Results  Component Value Date   NA 140 05/17/2020   K 4.1 05/17/2020   CL 106 05/17/2020   CO2 27 05/17/2020   GLUCOSE 100 (H) 05/17/2020   BUN 16 05/17/2020   CREATININE 1.03 05/17/2020   CALCIUM 8.9 05/17/2020   MG 1.8 05/17/2020   Lab Results  Component Value Date   INR 1.4 (H) 05/16/2020   No results found for: CHOL, HDL, LDLCALC, TRIG   GEN- The patient is well appearing, alert and oriented x 3 today.   Head- normocephalic, atraumatic Eyes-  Sclera clear, conjunctiva pink Ears- hearing intact Oropharynx- clear Neck- supple, no JVP Lymph- no cervical lymphadenopathy Lungs- Clear to ausculation bilaterally, normal work of breathing Heart- regular rate and rhythm, no murmurs, rubs or gallops, PMI not laterally displaced GI- soft, NT, ND, + BS Extremities- no clubbing, cyanosis, or edema MS- no significant deformity or atrophy Skin- no rash or lesion Psych- euthymic mood,  full affect Neuro- strength and sensation are intact  EKG-  sinis brady at 59 bpm, PR int 188 ms, qrs int 96 ms, qtc 459 ms  Echo-1. Left ventricular ejection fraction, by estimation, is 55 to 60%. The  left ventricle has normal function. The left ventricle has no regional  wall motion abnormalities. There is moderate left ventricular hypertrophy.  Left ventricular diastolic  parameters are indeterminate.  2. Right ventricular systolic function is normal. The right ventricular  size is normal. There is mildly elevated pulmonary artery systolic  pressure.  3. Left atrial size was severely dilated.  4. Right atrial size was moderately dilated.  5. The mitral valve is normal in structure. Trivial mitral valve  regurgitation. No evidence of mitral stenosis.  6. The aortic valve is tricuspid. Aortic valve regurgitation is not  visualized. No aortic stenosis is present.  7. The inferior vena cava is dilated in size with >50% respiratory  variability, suggesting right atrial pressure of 8 mmHg.    Assessment and Plan: 1. New onset afib Dx in March at time of physical  Asymptomatic  Rate controlled  Cardioversion was unsuccessful  Here today s/p  tikosyn admit  Is SR but does not feel any better  Continue tikosyn  500 mcg bid  Continue metoprolol 100 mg qd  He has had  both covid shots and was  covid negative  He is off triamterene and placed on lisinpril 10 mg, now c/o of a tickle cough and mild lightheadedness and soft BP  Will hold ACE over the weekend and he will call on MOnday with BP readings and any improvement in  symptoms qtc is stable  General Tikosyn precautions reviewed Bmet/mag today stable to continue Tikosyn    2.CHA2DS2VASc score of 2( age/htn) Continue  on  xarelto 20 mg daily     3.Lifstyle issues/triggers He has cut back on caffeine Regular exercise and weight loss  encouraged He has lost over 10 lbs by tracking food/calories, he hopes to get back in gym  now that  SR is restored  Sleep study done  positive and continues on cpap    Izzabelle Bouley C. Anthonymichael Munday, [facility] [address] [place], [place] [address] [phone]

## 2020-05-27 ENCOUNTER — Telehealth (HOSPITAL_COMMUNITY): Payer: Self-pay | Admitting: *Deleted

## 2020-05-27 NOTE — Telephone Encounter (Signed)
Pt states symptoms of cough and dizziness improved off lisinopril over weekend. BP running 112-126/60s. HR 55-60. Per Roderic Palau NP stop lisinopril will follow BPs will call if starts to increase.

## 2020-06-05 DIAGNOSIS — I10 Essential (primary) hypertension: Secondary | ICD-10-CM | POA: Diagnosis not present

## 2020-06-05 DIAGNOSIS — E7800 Pure hypercholesterolemia, unspecified: Secondary | ICD-10-CM | POA: Diagnosis not present

## 2020-06-05 DIAGNOSIS — N401 Enlarged prostate with lower urinary tract symptoms: Secondary | ICD-10-CM | POA: Diagnosis not present

## 2020-06-05 DIAGNOSIS — N400 Enlarged prostate without lower urinary tract symptoms: Secondary | ICD-10-CM | POA: Diagnosis not present

## 2020-06-05 DIAGNOSIS — I4891 Unspecified atrial fibrillation: Secondary | ICD-10-CM | POA: Diagnosis not present

## 2020-06-12 ENCOUNTER — Ambulatory Visit (INDEPENDENT_AMBULATORY_CARE_PROVIDER_SITE_OTHER): Payer: Medicare Other | Admitting: Student

## 2020-06-12 ENCOUNTER — Encounter: Payer: Self-pay | Admitting: Student

## 2020-06-12 ENCOUNTER — Other Ambulatory Visit: Payer: Self-pay

## 2020-06-12 VITALS — BP 128/64 | HR 53 | Ht 71.0 in | Wt 366.8 lb

## 2020-06-12 DIAGNOSIS — I4819 Other persistent atrial fibrillation: Secondary | ICD-10-CM

## 2020-06-12 DIAGNOSIS — I10 Essential (primary) hypertension: Secondary | ICD-10-CM | POA: Diagnosis not present

## 2020-06-12 NOTE — Progress Notes (Signed)
PCP:  [name], MD Primary Cardiologist: No primary care provider on file. Electrophysiologist: [name], MD   Christopher Hanson is a 69 y.o. male seen today for [name], MD for routine electrophysiology followup.  Since last being seen in our clinic the patient reports doing very well. Using CPAP nightly. He had mild chest soreness when using his rowing machine a few weeks ago. He has not retried that machine. Denies any exertional chest pain otherwise. Watching diet and salt intake.  he denies chest pain, palpitations, dyspnea, PND, orthopnea, nausea, vomiting, dizziness, syncope, weight gain, or early satiety. He has mild intermittent ankle edema.  Past Medical History:  Diagnosis Date  . GERD (gastroesophageal reflux disease)   . Hyperlipemia   . Hypertension   . Seasonal allergies   . Wears glasses    Past Surgical History:  Procedure Laterality Date  . CARDIOVERSION N/A 04/17/2020   Procedure: CARDIOVERSION;  Surgeon: [name], MD;  Location: [place];  Service: Cardiovascular;  Laterality: N/A;  . CARDIOVERSION N/A 05/16/2020   Procedure: CARDIOVERSION;  Surgeon: [name], MD;  Location: [place];  Service: Cardiovascular;  Laterality: N/A;  . CARPAL TUNNEL RELEASE     right and left  . COLONOSCOPY    . CYST EXCISION     face  . EAR CYST EXCISION Left 02/11/2015   Procedure: EXCISION OF LEFT POST AURICULAR CYST;  Surgeon: [name], MD;  Location: [place];  Service: ENT;  Laterality: Left;  . INGUINAL HERNIA REPAIR     bilat-as infant  . NASAL SINUS SURGERY  2000    Current Outpatient Medications  Medication Sig Dispense Refill  . cetirizine (ZYRTEC) 10 MG tablet Take 10 mg by mouth daily.     . Cholecalciferol (VITAMIN D3) 50 MCG (2000 UT) TABS Take 2,000 Units by mouth daily.    [name] dofetilide (TIKOSYN) 500 MCG capsule Take 1 capsule (500 mcg total) by mouth 2 (two) times daily. 60 capsule 6  . ezetimibe  (ZETIA) 10 MG tablet Take 10 mg by mouth at bedtime.     . metoprolol succinate (TOPROL-XL) 100 MG 24 hr tablet Take 100 mg by mouth daily.    . Multiple Minerals-Vitamins (CALCIUM-MAGNESIUM-ZINC-D3 PO) Take 1 tablet by mouth daily.     . Multiple Vitamins-Minerals (ONE-A-DAY MENS 50+) TABS Take 1 tablet by mouth daily with breakfast.    . omeprazole (PRILOSEC) 20 MG capsule Take 20 mg by mouth daily before breakfast.     . potassium chloride (KLOR-CON) 10 MEQ tablet Take 1 tablet (10 mEq total) by mouth daily. 90 tablet 3  . rivaroxaban (XARELTO) 20 MG TABS tablet Take 1 tablet (20 mg total) by mouth daily with supper. (Patient taking differently: Take 20 mg by mouth daily after supper. ) 30 tablet 3  . simvastatin (ZOCOR) 40 MG tablet Take 40 mg by mouth at bedtime.    . tamsulosin (FLOMAX) 0.4 MG CAPS capsule Take 0.4 mg by mouth in the morning and at bedtime.      No current facility-administered medications for this visit.    Allergies  Allergen Reactions  . Lisinopril Cough    Social History   Socioeconomic History  . Marital status: Married    Spouse name: Not on file  . Number of children: Not on file  . Years of education: Not on file  . Highest education level: Not on file  Occupational History  . Not on file  Tobacco Use  .  Smoking status: Never Smoker  . Smokeless tobacco: Never Used  Vaping Use  . Vaping Use: Never used  Substance and Sexual Activity  . Alcohol use: Yes    Comment: Rarely- one drink ,2 glasses occasional  . Drug use: No  . Sexual activity: Yes  Other Topics Concern  . Not on file  Social History Narrative  . Not on file   Social Determinants of Health   Financial Resource Strain:   . Difficulty of Paying Living Expenses:   Food Insecurity:   . Worried About Charity fundraiser in the Last Year:   . Arboriculturist in the Last Year:   Transportation Needs:   . Film/video editor (Medical):   [name] Lack of Transportation (Non-Medical):     Physical Activity:   . Days of Exercise per Week:   . Minutes of Exercise per Session:   Stress:   . Feeling of Stress :   Social Connections:   . Frequency of Communication with Friends and Family:   . Frequency of Social Gatherings with Friends and Family:   . Attends Religious Services:   . Active Member of Clubs or Organizations:   . Attends Archivist Meetings:   [name] Marital Status:   Intimate Partner Violence:   . Fear of Current or Ex-Partner:   . Emotionally Abused:   [name] Physically Abused:   . Sexually Abused:      Review of Systems: All other systems reviewed and are otherwise negative except as noted above.  Physical Exam: Vitals:   06/12/20 1225  BP: 128/64  Pulse: (!) 53  SpO2: 98%  Weight: (!) 366 lb 12.8 oz (166.4 kg)  Height: 5\' 11"  (1.803 m)    GEN- The patient is well appearing, alert and oriented x 3 today.   HEENT: normocephalic, atraumatic; sclera clear, conjunctiva pink; hearing intact; oropharynx clear; neck supple, no JVP Lymph- no cervical lymphadenopathy Lungs- Clear to ausculation bilaterally, normal work of breathing.  No wheezes, rales, rhonchi Heart- Regular rate and rhythm, no murmurs, rubs or gallops, PMI not laterally displaced GI- soft, non-tender, non-distended, bowel sounds present, no hepatosplenomegaly Extremities- no clubbing or cyanosis. DP/PT/radial pulses 2+ bilaterally. Trace bilateral ankle edema. MS- no significant deformity or atrophy Skin- warm and dry, no rash or lesion Psych- euthymic mood, full affect Neuro- strength and sensation are intact  EKG is ordered. Personal review of EKG from today shows Sinus bradycardia at 53 bpm. QTc 444 ms  Additional studies reviewed include: Previous EP and AF notes  Assessment and Plan:  1. Persistent atrial fibrillation Continue Xarelto for CHA2DS2VASC of at least 2   Pt remains in NSR/sinus brady on tikosyn 500 mcg BID BMET and Mg today  2. HTN Stable.   3. Cough  on ace inhibitor Can consider losartan or other agent if BP begins to climb (Normal EF last check)  4. Obesity Body mass index is 51.16 kg/m.  Encouraged weight loss through diet and lifestyle modifications.  Shirley Friar, PA-C  06/12/20 1:27 PM

## 2020-06-12 NOTE — Patient Instructions (Addendum)
Medication Instructions:  *If you need a refill on your cardiac medications before your next appointment, please call your pharmacy*  Lab Work: Your physician has recommended that you have lab work today: BMET  If you have labs (blood work) drawn today and your tests are completely normal, you will receive your results only by: [name] MyChart Message (if you have MyChart) OR . A paper copy in the mail If you have any lab test that is abnormal or we need to change your treatment, we will call you to review the results.  Follow-Up: At [facility], you and your health needs are our priority.  As part of our continuing mission to provide you with exceptional heart care, we have created designated Provider Care Teams.  These Care Teams include your primary Cardiologist (physician) and Advanced Practice Providers (APPs -  Physician Assistants and Nurse Practitioners) who all work together to provide you with the care you need, when you need it.  We recommend signing up for the patient portal called "MyChart".  Sign up information is provided on this After Visit Summary.  MyChart is used to connect with patients for Virtual Visits (Telemedicine).  Patients are able to view lab/test results, encounter notes, upcoming appointments, etc.  Non-urgent messages can be sent to your provider as well.   To learn more about what you can do with MyChart, go to [email or web address].    Your next appointment:   Your physician recommends that you follow-up appointment on Tuesday 09/24/20 at 8:50 am with Oda Kilts, PA-C  The format for your next appointment:   In Person with Oda Kilts, PA-C

## 2020-06-13 LAB — BASIC METABOLIC PANEL
BUN/Creatinine Ratio: 19 (ref 10–24)
BUN: 17 mg/dL (ref 8–27)
CO2: 22 mmol/L (ref 20–29)
Calcium: 9.3 mg/dL (ref 8.6–10.2)
Chloride: 107 mmol/L — ABNORMAL HIGH (ref 96–106)
Creatinine, Ser: 0.91 mg/dL (ref 0.76–1.27)
GFR calc Af Amer: 100 mL/min/{1.73_m2} (ref >59)
GFR calc non Af Amer: 86 mL/min/{1.73_m2} (ref >59)
Glucose: 71 mg/dL (ref 65–99)
Potassium: 4.2 mmol/L (ref 3.5–5.2)
Sodium: 145 mmol/L — ABNORMAL HIGH (ref 134–144)

## 2020-06-18 ENCOUNTER — Other Ambulatory Visit (HOSPITAL_COMMUNITY): Payer: Self-pay | Admitting: Nurse Practitioner

## 2020-07-08 ENCOUNTER — Encounter: Payer: Self-pay | Admitting: Cardiology

## 2020-07-08 ENCOUNTER — Other Ambulatory Visit: Payer: Self-pay

## 2020-07-08 ENCOUNTER — Ambulatory Visit (INDEPENDENT_AMBULATORY_CARE_PROVIDER_SITE_OTHER): Payer: Medicare Other | Admitting: Cardiology

## 2020-07-08 VITALS — BP 138/88 | HR 54 | Ht 72.0 in | Wt 369.8 lb

## 2020-07-08 DIAGNOSIS — I480 Paroxysmal atrial fibrillation: Secondary | ICD-10-CM | POA: Diagnosis not present

## 2020-07-08 DIAGNOSIS — Z6841 Body Mass Index (BMI) 40.0 and over, adult: Secondary | ICD-10-CM

## 2020-07-08 DIAGNOSIS — I10 Essential (primary) hypertension: Secondary | ICD-10-CM

## 2020-07-08 DIAGNOSIS — E7849 Other hyperlipidemia: Secondary | ICD-10-CM

## 2020-07-08 NOTE — Progress Notes (Signed)
Primary Care Provider: [name], MD Cardiologist: [name], MD Electrophysiologist: [name], MD  Clinic Note: Chief Complaint  Patient presents with  . Follow-up    Referral from A. fib clinic to establish "general cardiology "  . Atrial Fibrillation    Now on Tikosyn and beta-blocker along with DOAC-status post DCCV  . Obesity    Super morbidly obese with OSA now on CPAP    HPI:    Christopher Hanson is a 69 y.o. male with a PMH of recently diagnosed AFib who presents today to establish Primary Cardiologist at the request of Christopher Needs, NP.  Initial Cardiology assessment 02/16/2019 - was seen by PCP for routine eval on 3/18 - noted to be in A. fib and referred to A. fib clinic.  Has been seen multiple times.  But diagnosed with OSA, and started on CPAP-->  Has ended up on Tikosyn 500 mcg twice daily along with Toprol 100 mg daily. (not ablation candidate 2/2 weight & Severe LA Dilation) --> Started on CPAP, cut back caffeine.  Trying to get back into exercise using rowing machine.  Trying to adjust diet tracking calories.  Christopher Hanson was most recently seen on June 12, 2020 by Joesph July -> noted that he was using CPAP routinely.  Had some chest soreness when using his rowing machine.  Did not retry it.  Otherwise no exertional chest pain or dyspnea.  Intermittent ankle edema but otherwise no PND orthopnea.  (Lisinopril now listed as intolerance due to cough)  Recent Hospitalizations:   04/24/2020: Unsuccessful DCCV  6/15-182021 - started Jerry Caras & 2nd DCCV 05/16/2020 attempt successful  Reviewed  CV studies:    The following studies were reviewed today: (if available, images/films reviewed: From Epic Chart or Care Everywhere) . Echo 02/20/2020: EF 55-60%. No RWMA. Mod LVH. Unable to determine Diastolic Pressrue. Severe LA dilation, Mod RA dilation. Mildly dilated SVC - suggests RAP/CVP ~8 mmHg. . 04/24/2020-unsuccessful DCCV . 05/16/2020-successful  DCCV  Interval History:   Christopher Hanson states that he is happy since he has been out of A. fib.  He does indicate that he is not as short of breath as he had been, but initially did not really feel much different.  His blood pressures were little low after converting from Maxide to lisinopril to allow for Tikosyn load. -->  Was told to hold lisinopril continue metoprolol.  Since he held the ACE inhibitor, he is actually feeling better.  His energy level is getting better and he is really trying to make an effort to lose weight again.  He was leery of restarting the rowing machine after he had some chest discomfort. He says that he is sleeping better with CPAP, and his wife was concerned because now she does not hear him snoring, and even breathing with a CPAP machine is quiet.  He is morbidly obese and does have exertional dyspnea but no chest pain or pressure.  He does not have shortness of breath with routine activity just when he exercises.  No sense of recurrent fatigue or worsening exertional dyspnea to suggest recurrence of A. fib.  He never felt being in A. fib before.  CV Review of Symptoms (Summary) Cardiovascular ROS: positive for - dyspnea on exertion, edema and Both of these are stable if not improved.  Still has fatigue negative for - chest pain, irregular heartbeat, orthopnea, palpitations, paroxysmal nocturnal dyspnea, rapid heart rate, shortness of breath or Syncope/near syncope or TIA/amaurosis fugax, claudication.  Melena,  hematochezia, hematuria or epistaxis.  The patient does not have symptoms concerning for COVID-19 infection (fever, chills, cough, or new shortness of breath).  The patient is practicing social distancing & Masking.  Immunization History  Administered Date(s) Administered  . Influenza Split 08/30/2013  . Moderna SARS-COVID-2 Vaccination 01/12/2020, 02/09/2020    REVIEWED OF SYSTEMS   Review of Systems  Constitutional: Positive for malaise/fatigue (He  does have some easy fatigue). Negative for weight loss (He gained 36 pounds back from 80 pound weight loss with diet and exercise (prior to Covid was going to the gym regularly); he stopped using wrong machine because of back injury.).  Respiratory: Negative for shortness of breath.   Cardiovascular: Positive for chest pain (He had chest wall pain across the upper chest with rowing machine exercise.  Now resolved).  Gastrointestinal: Negative for abdominal pain, blood in stool and melena.  Genitourinary: Negative for dysuria, hematuria and urgency.  Musculoskeletal: Positive for back pain and joint pain.  Neurological: Negative for dizziness, weakness and headaches.  Psychiatric/Behavioral: Negative for memory loss. The patient is not nervous/anxious and does not have insomnia (Sleeping better with CPAP).    I have reviewed and (if needed) personally updated the patient's problem list, medications, allergies, past medical and surgical history, social and family history.   PAST MEDICAL HISTORY   Past Medical History:  Diagnosis Date  . GERD (gastroesophageal reflux disease)   . Hyperlipemia   . Hypertension   . Morbidly obese (HCC)    BMI 50  . OSA on CPAP 03/2020   Diagnosed April-May 2021  . Paroxysmal atrial fibrillation ([place]) 01/2018   Persistent paroxysmal A. fib; s/p facilitated DCCV while on Tikosyn (May 16, 2020)  . Seasonal allergies   . Wears glasses     PAST SURGICAL HISTORY   Past Surgical History:  Procedure Laterality Date  . CARDIOVERSION N/A 04/17/2020   Procedure: CARDIOVERSION;  Surgeon: [name], MD;  Location: [place];  Service: Cardiovascular;  Laterality: N/A;  . CARDIOVERSION N/A 05/16/2020   Procedure: CARDIOVERSION;  Surgeon: [name], MD;  Location: [place];  Service: Cardiovascular;  Laterality: N/A;  . CARPAL TUNNEL RELEASE     right and left  . COLONOSCOPY    . CYST EXCISION     face  . EAR CYST EXCISION Left  02/11/2015   Procedure: EXCISION OF LEFT POST AURICULAR CYST;  Surgeon: [name], MD;  Location: [place];  Service: ENT;  Laterality: Left;  . INGUINAL HERNIA REPAIR     bilat-as infant  . NASAL SINUS SURGERY  2000    MEDICATIONS/ALLERGIES   Current Meds  Medication Sig  . cetirizine (ZYRTEC) 10 MG tablet Take 10 mg by mouth daily.   . Cholecalciferol (VITAMIN D3) 50 MCG (2000 UT) TABS Take 2,000 Units by mouth daily.  [name] dofetilide (TIKOSYN) 500 MCG capsule Take 1 capsule (500 mcg total) by mouth 2 (two) times daily.  [name] ezetimibe (ZETIA) 10 MG tablet Take 10 mg by mouth at bedtime.   . metoprolol succinate (TOPROL-XL) 100 MG 24 hr tablet Take 100 mg by mouth daily.  . Multiple Minerals-Vitamins (CALCIUM-MAGNESIUM-ZINC-D3 PO) Take 1 tablet by mouth daily.   . Multiple Vitamins-Minerals (ONE-A-DAY MENS 50+) TABS Take 1 tablet by mouth daily with breakfast.  . omeprazole (PRILOSEC) 20 MG capsule Take 20 mg by mouth daily before breakfast.   . potassium chloride (KLOR-CON) 10 MEQ tablet Take 1 tablet (10 mEq total) by mouth daily.  [name]  rivaroxaban (XARELTO) 20 MG TABS tablet Take 1 tablet (20 mg total) by mouth daily with supper.  . simvastatin (ZOCOR) 40 MG tablet Take 40 mg by mouth at bedtime.  . tamsulosin (FLOMAX) 0.4 MG CAPS capsule Take 0.4 mg by mouth in the morning and at bedtime.     Allergies  Allergen Reactions  . Lisinopril Cough    SOCIAL HISTORY/FAMILY HISTORY   Social History   Tobacco Use  . Smoking status: Never Smoker  . Smokeless tobacco: Never Used  Vaping Use  . Vaping Use: Never used  Substance Use Topics  . Alcohol use: Yes    Comment: Rarely- one drink ,2 glasses occasional  . Drug use: No   Social History   Social History Narrative  . Not on file   History reviewed. No pertinent family history.   OBJCTIVE -PE, EKG, labs   Wt Readings from Last 3 Encounters:  07/08/20 (!) 369 lb 12.8 oz (167.7 kg)  06/12/20 (!) 366 lb 12.8  oz (166.4 kg)  05/24/20 (!) 362 lb 12.8 oz (164.6 kg)    Physical Exam: BP 138/88   Pulse (!) 54   Ht 6' (1.829 m)   Wt (!) 369 lb 12.8 oz (167.7 kg)   SpO2 96%   BMI 50.15 kg/m He indicates to me that the blood pressure today is very high for him.  Usually systolics in the 836O and diastolics in the 29U. Physical Exam Vitals reviewed.  Constitutional:      General: He is not in acute distress.    Appearance: He is not ill-appearing or diaphoretic (But a little sweaty).     Comments: Super morbidly obese.  Well-groomed.  HENT:     Head: Normocephalic and atraumatic.  Neck:     Vascular: No carotid bruit or JVD (Very difficult to assess).  Cardiovascular:     Rate and Rhythm: Regular rhythm. Bradycardia present.  No extrasystoles are present.    Chest Wall: PMI is not displaced (Unable to palpate).     Pulses: Decreased pulses (Due to body habitus).     Heart sounds: S1 normal and S2 normal. Heart sounds are distant. No murmur heard.  No friction rub. No gallop. No S4 sounds.   Pulmonary:     Effort: Pulmonary effort is normal. No respiratory distress.     Breath sounds: Normal breath sounds. No stridor. No wheezing or rhonchi.     Comments: Distant breath sounds Chest:     Chest wall: No tenderness.  Abdominal:     General: Abdomen is flat. Bowel sounds are normal. There is no distension.     Palpations: Abdomen is soft. There is no mass.     Comments: Morbid truncal obesity.  Unable to assess HSM  Musculoskeletal:        General: Swelling (Trivial bilateral LE) present. Normal range of motion.     Cervical back: Normal range of motion and neck supple.  Neurological:     General: No focal deficit present.     Mental Status: He is alert and oriented to person, place, and time.  Psychiatric:        Mood and Affect: Mood normal.        Behavior: Behavior normal.        Thought Content: Thought content normal.        Judgment: Judgment normal.     Adult ECG Report   Rate: 54;  Rhythm: sinus bradycardia and Otherwise normal axis, intervals durations.;  Narrative Interpretation: Relatively normal EKG.  Recent Labs:     02/05/2020: TC 140, TG 99, HDL 44, LDL 77.  H GB 15.2.  TSH 1.32. Lab Results  Component Value Date   CREATININE 0.91 06/12/2020   BUN 17 06/12/2020   NA 145 (H) 06/12/2020   K 4.2 06/12/2020   CL 107 (H) 06/12/2020   CO2 22 06/12/2020   No results found for: TSH  ASSESSMENT/PLAN   This patients CHA2DS2-VASc Score and unadjusted Ischemic Stroke Rate (% per year) is equal to 2.2 % stroke rate/year from a score of 2  Above score calculated as 1 point each if present [CHF, HTN, DM, Vascular=MI/PAD/Aortic Plaque, Age if 65-74, or Male] Above score calculated as 2 points each if present [Age > 75, or Stroke/TIA/TE]   Problem List Items Addressed This Visit    Paroxysmal (persistent) atrial fibrillation (HCC) -CHA2DS2-VASc score 2 - Primary (Chronic)    Prolonged, somewhat difficult A. fib requiring Tikosyn to assist cardioversion.  Was unsuccessful cardioverting without Tikosyn.  Tikosyn being followed by Dr. Rayann Heman and the A. fib clinic. -> Will defer management of medications to EP.  I spent at least 10 to 15 minutes reviewing pathophysiology of atrial fibrillation, potential symptoms, concern for stroke as well as medical management including AV nodal agents and antiarrhythmics.  Echo was relatively normal.  In the absence of any active anginal symptoms, stress test was not checked.  We will monitor for symptoms and in the future consider the possibility of coronary CTA or at the minimum coronary calcium score.       Relevant Orders   EKG 12-Lead (Completed)   Morbid obesity with BMI of 50.0-59.9, adult ([place]) (Chronic)    He had done a really good job losing weight, and indifference to him, he did have a setback because of COVID-19 lockdown keeping him from the gym.  I encouraged him to get back to doing his exercise and he  really Hanson to do some walking exercise even if it means water walking.  Also Hanson to cut back on his diet.  May need nutrition counseling.      Essential hypertension (Chronic)   Hyperlipidemia due to dietary fat intake (Chronic)    Lipid panel actually did not look bad on current dose of simvastatin/Zetia.  Labs to be followed by PCP.  Hopefully with weight loss, it will improve any more.  He is working on increasing his exercise level and change his diet.          COVID-19 Education: The signs and symptoms of COVID-19 were discussed with the patient and how to seek care for testing (follow up with PCP or arrange E-visit).   The importance of social distancing and COVID-19 vaccination was discussed today.  I spent a total of 32 minutes with the patient. >  50% of the time was spent in direct patient consultation.  --> Patient chose to establish cardiology care with me because of his wife's desire for explanations.  Multiple questions asked and answered.  I then discussed the pathophysiology of A. fib, the mechanism of how Tikosyn works, and the reason for anticoagulation.  We also discussed correlation with obesity, OSA and use of CPAP.  Additional time spent with chart review  / charting (studies, outside notes, etc): 8min --> multiple A. fib clinic visits and hospitalizations reviewed along with echo. Total Time: 52 min   Current medicines are reviewed at length with the patient today.  (+/- concerns) N/A  Notice: This dictation was prepared with Dragon dictation along with smaller phrase technology. Any transcriptional errors that result from this process are unintentional and may not be corrected upon review.  Patient Instructions / Medication Changes & Studies & Tests Ordered   Patient Instructions  Medication Instructions:  No changes  *If you need a refill on your cardiac medications before your next appointment, please call your pharmacy*   Lab Work: Not  needed   Testing/Procedures: Not needed   Follow-Up: At [facility], you and your health Hanson are our priority.  As part of our continuing mission to provide you with exceptional heart care, we have created designated Provider Care Teams.  These Care Teams include your primary Cardiologist (physician) and Advanced Practice Providers (APPs -  Physician Assistants and Nurse Practitioners) who all work together to provide you with the care you need, when you need it.     Your next appointment:   6 month(s)  The format for your next appointment:   In Person  Provider:   [name], MD   Other Instructions    Studies Ordered:   Orders Placed This Encounter  Procedures  . EKG 12-Lead     [name], M.D., M.S. Interventional Cardiologist   Pager # [phone] Phone # [phone] [address]. [place], [place] [address]   Thank you for choosing Heartcare at [facility]!!

## 2020-07-08 NOTE — Patient Instructions (Signed)
Medication Instructions:   No changes *If you need a refill on your cardiac medications before your next appointment, please call your pharmacy*   Lab Work: Not needed .   Testing/Procedures:  Not needed  Follow-Up: At [facility], you and your health needs are our priority.  As part of our continuing mission to provide you with exceptional heart care, we have created designated Provider Care Teams.  These Care Teams include your primary Cardiologist (physician) and Advanced Practice Providers (APPs -  Physician Assistants and Nurse Practitioners) who all work together to provide you with the care you need, when you need it.     Your next appointment:   6 month(s)  The format for your next appointment:   In Person  Provider:   David Harding, MD   Other Instructions 

## 2020-07-09 ENCOUNTER — Encounter: Payer: Self-pay | Admitting: Cardiology

## 2020-07-09 DIAGNOSIS — E7849 Other hyperlipidemia: Secondary | ICD-10-CM | POA: Insufficient documentation

## 2020-07-09 DIAGNOSIS — I10 Essential (primary) hypertension: Secondary | ICD-10-CM | POA: Insufficient documentation

## 2020-07-09 NOTE — Assessment & Plan Note (Addendum)
Lipid panel actually did not look bad on current dose of simvastatin/Zetia.  Labs to be followed by PCP.  Hopefully with weight loss, it will improve any more.  He is working on increasing his exercise level and change his diet.

## 2020-07-09 NOTE — Assessment & Plan Note (Signed)
He had done a really good job losing weight, and indifference to him, he did have a setback because of COVID-19 lockdown keeping him from the gym.  I encouraged him to get back to doing his exercise and he really needs to do some walking exercise even if it means water walking.  Also needs to cut back on his diet.  May need nutrition counseling.

## 2020-07-09 NOTE — Assessment & Plan Note (Addendum)
Prolonged, somewhat difficult A. fib requiring Tikosyn to assist cardioversion.  Was unsuccessful cardioverting without Tikosyn.  Tikosyn being followed by Dr. Rayann Heman and the A. fib clinic. -> Will defer management of medications to EP.  I spent at least 10 to 15 minutes reviewing pathophysiology of atrial fibrillation, potential symptoms, concern for stroke as well as medical management including AV nodal agents and antiarrhythmics.  Echo was relatively normal.  In the absence of any active anginal symptoms, stress test was not checked.  We will monitor for symptoms and in the future consider the possibility of coronary CTA or at the minimum coronary calcium score.

## 2020-09-20 DIAGNOSIS — I4891 Unspecified atrial fibrillation: Secondary | ICD-10-CM | POA: Diagnosis not present

## 2020-09-20 DIAGNOSIS — N400 Enlarged prostate without lower urinary tract symptoms: Secondary | ICD-10-CM | POA: Diagnosis not present

## 2020-09-20 DIAGNOSIS — N401 Enlarged prostate with lower urinary tract symptoms: Secondary | ICD-10-CM | POA: Diagnosis not present

## 2020-09-20 DIAGNOSIS — E7800 Pure hypercholesterolemia, unspecified: Secondary | ICD-10-CM | POA: Diagnosis not present

## 2020-09-20 DIAGNOSIS — I10 Essential (primary) hypertension: Secondary | ICD-10-CM | POA: Diagnosis not present

## 2020-09-23 NOTE — Progress Notes (Signed)
PCP:  [name], MD Primary Cardiologist: [name], MD Electrophysiologist: [name], MD   Christopher Hanson is a 69 y.o. male seen today for [name], MD for routine electrophysiology followup.  Since last being seen in our clinic the patient reports doing very well. He has picked up "a few pounds" in the setting of not being very active during [place], but knows he needs to lose them.  he denies chest pain, palpitations, dyspnea, PND, orthopnea, nausea, vomiting, dizziness, syncope, edema, weight gain, or early satiety. He is considering the COVID booster, but is worried that it would cause a breakthrough.   Past Medical History:  Diagnosis Date  . GERD (gastroesophageal reflux disease)   . Hyperlipemia   . Hypertension   . Morbidly obese (HCC)    BMI 50  . OSA on CPAP 03/2020   Diagnosed April-May 2021  . Paroxysmal atrial fibrillation ([place]) 01/2018   Persistent paroxysmal A. fib; s/p facilitated DCCV while on Tikosyn (May 16, 2020)  . Seasonal allergies   . Wears glasses    Past Surgical History:  Procedure Laterality Date  . CARDIOVERSION N/A 04/17/2020   Procedure: CARDIOVERSION;  Surgeon: [name], MD;  Location: [place];  Service: Cardiovascular;  Laterality: N/A;  . CARDIOVERSION N/A 05/16/2020   Procedure: CARDIOVERSION;  Surgeon: [name], MD;  Location: [place];  Service: Cardiovascular;  Laterality: N/A;  . CARPAL TUNNEL RELEASE     right and left  . COLONOSCOPY    . CYST EXCISION     face  . EAR CYST EXCISION Left 02/11/2015   Procedure: EXCISION OF LEFT POST AURICULAR CYST;  Surgeon: [name], MD;  Location: [place];  Service: ENT;  Laterality: Left;  . INGUINAL HERNIA REPAIR     bilat-as infant  . NASAL SINUS SURGERY  2000    Current Outpatient Medications  Medication Sig Dispense Refill  . calcium gluconate 500 MG tablet Take 1 tablet by mouth daily.    . cetirizine (ZYRTEC) 10 MG  tablet Take 10 mg by mouth daily.     . Cholecalciferol (VITAMIN D3) 50 MCG (2000 UT) TABS Take 2,000 Units by mouth daily.    . Cholecalciferol (VITAMIN D3) 50 MCG (2000 UT) TABS Take by mouth daily.    [name] dofetilide (TIKOSYN) 500 MCG capsule Take 1 capsule (500 mcg total) by mouth 2 (two) times daily. 60 capsule 6  . ezetimibe (ZETIA) 10 MG tablet Take 10 mg by mouth at bedtime.     . metoprolol succinate (TOPROL-XL) 100 MG 24 hr tablet Take 100 mg by mouth daily.    . Multiple Minerals-Vitamins (CALCIUM-MAGNESIUM-ZINC-D3 PO) Take 1 tablet by mouth daily.     [name] omeprazole (PRILOSEC) 20 MG capsule Take 20 mg by mouth daily before breakfast.     . potassium chloride (KLOR-CON) 10 MEQ tablet Take 1 tablet (10 mEq total) by mouth daily. 90 tablet 3  . rivaroxaban (XARELTO) 20 MG TABS tablet Take 1 tablet (20 mg total) by mouth daily with supper. 30 tablet 11  . simvastatin (ZOCOR) 40 MG tablet Take 40 mg by mouth at bedtime.    . tamsulosin (FLOMAX) 0.4 MG CAPS capsule Take 0.4 mg by mouth in the morning and at bedtime.      No current facility-administered medications for this visit.    Allergies  Allergen Reactions  . Lisinopril Cough    Social History   Socioeconomic History  . Marital status: Married  Spouse name: Not on file  . Number of children: Not on file  . Years of education: Not on file  . Highest education level: Not on file  Occupational History  . Not on file  Tobacco Use  . Smoking status: Never Smoker  . Smokeless tobacco: Never Used  Vaping Use  . Vaping Use: Never used  Substance and Sexual Activity  . Alcohol use: Yes    Comment: Rarely- one drink ,2 glasses occasional  . Drug use: No  . Sexual activity: Yes  Other Topics Concern  . Not on file  Social History Narrative  . Not on file   Social Determinants of Health   Financial Resource Strain:   . Difficulty of Paying Living Expenses: Not on file  Food Insecurity:   . Worried About Sales executive in the Last Year: Not on file  . Ran Out of Food in the Last Year: Not on file  Transportation Needs:   . Lack of Transportation (Medical): Not on file  . Lack of Transportation (Non-Medical): Not on file  Physical Activity:   . Days of Exercise per Week: Not on file  . Minutes of Exercise per Session: Not on file  Stress:   . Feeling of Stress : Not on file  Social Connections:   . Frequency of Communication with Friends and Family: Not on file  . Frequency of Social Gatherings with Friends and Family: Not on file  . Attends Religious Services: Not on file  . Active Member of Clubs or Organizations: Not on file  . Attends Archivist Meetings: Not on file  . Marital Status: Not on file  Intimate Partner Violence:   . Fear of Current or Ex-Partner: Not on file  . Emotionally Abused: Not on file  . Physically Abused: Not on file  . Sexually Abused: Not on file     Review of Systems: General: No chills, fever, night sweats or weight changes  Cardiovascular:  No chest pain, dyspnea on exertion, edema, orthopnea, palpitations, paroxysmal nocturnal dyspnea Dermatological: No rash, lesions or masses Respiratory: No cough, dyspnea Urologic: No hematuria, dysuria Abdominal: No nausea, vomiting, diarrhea, bright red blood per rectum, melena, or hematemesis Neurologic: No visual changes, weakness, changes in mental status All other systems reviewed and are otherwise negative except as noted above.  Physical Exam: Vitals:   09/24/20 0849  BP: 120/80  Pulse: (!) 56  SpO2: 98%  Weight: (!) 384 lb (174.2 kg)  Height: 6' (1.829 m)    GEN- The patient is well appearing, alert and oriented x 3 today.   HEENT: normocephalic, atraumatic; sclera clear, conjunctiva pink; hearing intact; oropharynx clear; neck supple, no JVP Lymph- no cervical lymphadenopathy Lungs- Clear to ausculation bilaterally, normal work of breathing.  No wheezes, rales, rhonchi Heart- Regular rate and  rhythm, no murmurs, rubs or gallops, PMI not laterally displaced GI- soft, non-tender, non-distended, bowel sounds present, no hepatosplenomegaly Extremities- no clubbing, cyanosis, or edema; DP/PT/radial pulses 2+ bilaterally MS- no significant deformity or atrophy Skin- warm and dry, no rash or lesion Psych- euthymic mood, full affect Neuro- strength and sensation are intact  EKG is not ordered. Personal review of EKG from 07/08/2020 shows sinus bradycardia   Additional studies reviewed include: Previous EP office notes  Assessment and Plan:  1. Persistent atrial fibrillation Continue Xarelto for CHA2DS2VASC of at least 2   EKG 07/08/2020 shows sinus bradycardia on tikosyn 500 mcg BID. Regular rhythm today.  Labs today  2. HTN Stable.  3. Cough on ace inhibitor Can consider losartan or other agent if BP begins to climb (Normal EF last check) BP stable on current medications. Encouraged weight loss.   4. Obesity Body mass index is 52.08 kg/m.  Encouraged weight loss through diet and lifestyle modifications.  5. Health Maintenance Encouraged moderna booster once available. We discussed that management of an AF breakthrough would be much preferable to COVID.   Shirley Friar, PA-C  09/24/20 9:08 AM

## 2020-09-24 ENCOUNTER — Ambulatory Visit (INDEPENDENT_AMBULATORY_CARE_PROVIDER_SITE_OTHER): Payer: Medicare Other | Admitting: Student

## 2020-09-24 ENCOUNTER — Encounter: Payer: Self-pay | Admitting: Student

## 2020-09-24 ENCOUNTER — Other Ambulatory Visit: Payer: Self-pay

## 2020-09-24 VITALS — BP 120/80 | HR 56 | Ht 72.0 in | Wt 384.0 lb

## 2020-09-24 DIAGNOSIS — I10 Essential (primary) hypertension: Secondary | ICD-10-CM

## 2020-09-24 DIAGNOSIS — Z6841 Body Mass Index (BMI) 40.0 and over, adult: Secondary | ICD-10-CM

## 2020-09-24 DIAGNOSIS — I480 Paroxysmal atrial fibrillation: Secondary | ICD-10-CM

## 2020-09-24 LAB — CBC WITH DIFFERENTIAL/PLATELET
Basophils Absolute: 0.1 10*3/uL (ref 0.0–0.2)
Basos: 1 %
EOS (ABSOLUTE): 0.2 10*3/uL (ref 0.0–0.4)
Eos: 4 %
Hematocrit: 39.6 % (ref 37.5–51.0)
Hemoglobin: 13.5 g/dL (ref 13.0–17.7)
Immature Grans (Abs): 0 10*3/uL (ref 0.0–0.1)
Immature Granulocytes: 0 %
Lymphocytes Absolute: 1.5 10*3/uL (ref 0.7–3.1)
Lymphs: 26 %
MCH: 29.2 pg (ref 26.6–33.0)
MCHC: 34.1 g/dL (ref 31.5–35.7)
MCV: 86 fL (ref 79–97)
Monocytes Absolute: 0.5 10*3/uL (ref 0.1–0.9)
Monocytes: 9 %
Neutrophils Absolute: 3.4 10*3/uL (ref 1.4–7.0)
Neutrophils: 60 %
Platelets: 181 10*3/uL (ref 150–450)
RBC: 4.63 x10E6/uL (ref 4.14–5.80)
RDW: 13 % (ref 11.6–15.4)
WBC: 5.6 10*3/uL (ref 3.4–10.8)

## 2020-09-24 LAB — BASIC METABOLIC PANEL
BUN/Creatinine Ratio: 14 (ref 10–24)
BUN: 15 mg/dL (ref 8–27)
CO2: 23 mmol/L (ref 20–29)
Calcium: 9.4 mg/dL (ref 8.6–10.2)
Chloride: 105 mmol/L (ref 96–106)
Creatinine, Ser: 1.09 mg/dL (ref 0.76–1.27)
GFR calc Af Amer: 80 mL/min/{1.73_m2} (ref >59)
GFR calc non Af Amer: 69 mL/min/{1.73_m2} (ref >59)
Glucose: 99 mg/dL (ref 65–99)
Potassium: 4.4 mmol/L (ref 3.5–5.2)
Sodium: 142 mmol/L (ref 134–144)

## 2020-09-24 LAB — MAGNESIUM: Magnesium: 2 mg/dL (ref 1.6–2.3)

## 2020-09-24 NOTE — Patient Instructions (Signed)
Medication Instructions:  *If you need a refill on your cardiac medications before your next appointment, please call your pharmacy*  Lab Work: Your physician has recommended that you have lab work today: BMET, CBC, and Magnesium Level If you have labs (blood work) drawn today and your tests are completely normal, you will receive your results only by: [name] MyChart Message (if you have MyChart) OR . A paper copy in the mail If you have any lab test that is abnormal or we need to change your treatment, we will call you to review the results.  Follow-Up: At [facility], you and your health needs are our priority.  As part of our continuing mission to provide you with exceptional heart care, we have created designated Provider Care Teams.  These Care Teams include your primary Cardiologist (physician) and Advanced Practice Providers (APPs -  Physician Assistants and Nurse Practitioners) who all work together to provide you with the care you need, when you need it.  We recommend signing up for the patient portal called "MyChart".  Sign up information is provided on this After Visit Summary.  MyChart is used to connect with patients for Virtual Visits (Telemedicine).  Patients are able to view lab/test results, encounter notes, upcoming appointments, etc.  Non-urgent messages can be sent to your provider as well.   To learn more about what you can do with MyChart, go to [email or web address].    Your next appointment:   Your physician recommends that you schedule a follow-up appointment in: 6 MONTHS with Roderic Palau, NP in the [place] physician wants you to follow-up in: 1 YEAR with Oda Kilts, PA-C. You will receive a reminder letter in the mail two months in advance. If you don't receive a letter, please call our office to schedule the follow-up appointment.  The format for your next appointment:   You will follow up in the [facility] located at [facility]. Your provider will be: Roderic Palau, NP

## 2020-09-26 DIAGNOSIS — Z23 Encounter for immunization: Secondary | ICD-10-CM | POA: Diagnosis not present

## 2020-10-08 ENCOUNTER — Other Ambulatory Visit: Payer: Self-pay

## 2020-10-08 ENCOUNTER — Emergency Department (HOSPITAL_BASED_OUTPATIENT_CLINIC_OR_DEPARTMENT_OTHER): Payer: Medicare Other

## 2020-10-08 ENCOUNTER — Emergency Department (HOSPITAL_BASED_OUTPATIENT_CLINIC_OR_DEPARTMENT_OTHER)
Admission: EM | Admit: 2020-10-08 | Discharge: 2020-10-08 | Disposition: A | Discharge status: Home / Self Care | Payer: Medicare Other | Attending: Emergency Medicine | Admitting: Emergency Medicine

## 2020-10-08 ENCOUNTER — Encounter (HOSPITAL_BASED_OUTPATIENT_CLINIC_OR_DEPARTMENT_OTHER): Payer: Self-pay

## 2020-10-08 DIAGNOSIS — I10 Essential (primary) hypertension: Secondary | ICD-10-CM | POA: Insufficient documentation

## 2020-10-08 DIAGNOSIS — K5792 Diverticulitis of intestine, part unspecified, without perforation or abscess without bleeding: Secondary | ICD-10-CM | POA: Diagnosis not present

## 2020-10-08 DIAGNOSIS — R109 Unspecified abdominal pain: Secondary | ICD-10-CM | POA: Diagnosis not present

## 2020-10-08 DIAGNOSIS — R001 Bradycardia, unspecified: Secondary | ICD-10-CM | POA: Diagnosis not present

## 2020-10-08 DIAGNOSIS — Z7901 Long term (current) use of anticoagulants: Secondary | ICD-10-CM | POA: Insufficient documentation

## 2020-10-08 DIAGNOSIS — I4891 Unspecified atrial fibrillation: Secondary | ICD-10-CM | POA: Diagnosis not present

## 2020-10-08 DIAGNOSIS — R1032 Left lower quadrant pain: Secondary | ICD-10-CM | POA: Diagnosis present

## 2020-10-08 DIAGNOSIS — Z79899 Other long term (current) drug therapy: Secondary | ICD-10-CM | POA: Diagnosis not present

## 2020-10-08 LAB — COMPREHENSIVE METABOLIC PANEL
ALT: 28 U/L (ref 0–44)
AST: 21 U/L (ref 15–41)
Albumin: 4.1 g/dL (ref 3.5–5.0)
Alkaline Phosphatase: 55 U/L (ref 38–126)
Anion gap: 9 (ref 5–15)
BUN: 14 mg/dL (ref 8–23)
CO2: 26 mmol/L (ref 22–32)
Calcium: 9.1 mg/dL (ref 8.9–10.3)
Chloride: 103 mmol/L (ref 98–111)
Creatinine, Ser: 1.02 mg/dL (ref 0.61–1.24)
GFR, Estimated: >60 mL/min (ref >60)
Glucose, Bld: 96 mg/dL (ref 70–99)
Potassium: 4.1 mmol/L (ref 3.5–5.1)
Sodium: 138 mmol/L (ref 135–145)
Total Bilirubin: 0.9 mg/dL (ref 0.3–1.2)
Total Protein: 7.4 g/dL (ref 6.5–8.1)

## 2020-10-08 LAB — CBC
HCT: 39.8 % (ref 39.0–52.0)
Hemoglobin: 13.3 g/dL (ref 13.0–17.0)
MCH: 29 pg (ref 26.0–34.0)
MCHC: 33.4 g/dL (ref 30.0–36.0)
MCV: 86.9 fL (ref 80.0–100.0)
Platelets: 164 10*3/uL (ref 150–400)
RBC: 4.58 MIL/uL (ref 4.22–5.81)
RDW: 13.2 % (ref 11.5–15.5)
WBC: 7.1 10*3/uL (ref 4.0–10.5)
nRBC: 0 % (ref 0.0–0.2)

## 2020-10-08 LAB — URINALYSIS, ROUTINE W REFLEX MICROSCOPIC
Bilirubin Urine: NEGATIVE
Glucose, UA: NEGATIVE mg/dL
Hgb urine dipstick: NEGATIVE
Ketones, ur: NEGATIVE mg/dL
Leukocytes,Ua: NEGATIVE
Nitrite: NEGATIVE
Protein, ur: NEGATIVE mg/dL
Specific Gravity, Urine: <1.005 — ABNORMAL LOW (ref 1.005–1.030)
pH: 6.5 (ref 5.0–8.0)

## 2020-10-08 LAB — LIPASE, BLOOD: Lipase: 23 U/L (ref 11–51)

## 2020-10-08 MED ORDER — IOHEXOL 300 MG/ML  SOLN
100.0000 mL | Freq: Once | INTRAMUSCULAR | Status: AC | PRN
  Administered 2020-10-08: 100 mL via INTRAVENOUS

## 2020-10-08 MED ORDER — AMOXICILLIN-POT CLAVULANATE 875-125 MG PO TABS: 1.0000 | ORAL_TABLET | Freq: Two times a day (BID) | ORAL | 0 refills | Status: AC

## 2020-10-08 MED ORDER — METRONIDAZOLE 500 MG PO TABS: 500.0000 mg | ORAL_TABLET | Freq: Three times a day (TID) | ORAL | 0 refills | Status: AC

## 2020-10-08 NOTE — Discharge Instructions (Signed)
Present with left lower abdominal pain.  Imaging shows you have diverticulitis.  Start you on antibiotics please take as prescribed.  I recommend staying on a liquid diet for the first 24 hours, if you are feeling better then you may advance to a soft foods diet, if you continue tolerate that for 24 hours you may advance your diet again.  Recommend taking over-the-counter pain medications like ibuprofen and or Tylenol or 6 as needed please call dosing the back of bottle.  Please follow-up with your PCP for further evaluation management.  Come back to the emergency department if you develop chest pain, shortness of breath, severe abdominal pain, uncontrolled nausea, vomiting, diarrhea.

## 2020-10-08 NOTE — ED Triage Notes (Signed)
Pt c/o left side abd pain started yesterday 315am-denies n/v/d-NAD-steady gait

## 2020-10-08 NOTE — ED Notes (Signed)
Pt discharged to home. Discharge instructions have been discussed with patient and/or family members. Pt verbally acknowledges understanding d/c instructions, and endorses comprehension to checkout at registration before leaving.  °

## 2020-10-08 NOTE — ED Provider Notes (Signed)
[facility] EMERGENCY DEPARTMENT Provider Note   CSN: [id number] Arrival date & time: 10/08/20  1133     History Chief Complaint  Patient presents with  . Abdominal Pain    Christopher Hanson is a 69 y.o. male.  HPI   Patient with significant medical history of proximal A. fib currently on Xarelto, hypertension, obesity, diverticulitis presents to the emergency department with chief complaint of left sided abdominal pain.  Patient states the pain came on suddenly yesterday morning.  He describes the pain as a consistent throbbing sensation that waxing and wanes with sharp pains.  He states the pain was originally along his left upper abdomen but now has settled more in his left lower abdomen.  He states he has had normal bowel movements, denies diarrhea, constipation, dark tarry stools or bloody stools.  He has had no episodes of nausea or vomiting but states he has had a lack of appetite.  He mentions that he has had diverticulitis in the past but states generally he feels it more in his lower abdomen in comparison today.  He endorses he has had kidney stones in the past but again this does not feel like this episode.  He denies any urinary symptoms, dysuria, urinary urgency frequency or hematuria.  He denies fevers, chills, or any other normal history at this time.  Patient denies headaches, fevers, chills, shortness of breath, chest pain, nausea, vomiting, diarrhea, worsening pedal edema.  Past Medical History:  Diagnosis Date  . GERD (gastroesophageal reflux disease)   . Hyperlipemia   . Hypertension   . Morbidly obese (HCC)    BMI 50  . OSA on CPAP 03/2020   Diagnosed April-May 2021  . Paroxysmal atrial fibrillation ([place]) 01/2018   Persistent paroxysmal A. fib; s/p facilitated DCCV while on Tikosyn (May 16, 2020)  . Seasonal allergies   . Wears glasses     Patient Active Problem List   Diagnosis Date Noted  . Essential hypertension 07/09/2020  . Morbid obesity with  BMI of 50.0-59.9, adult ([place]) 07/09/2020  . Hyperlipidemia due to dietary fat intake 07/09/2020  . Paroxysmal (persistent) atrial fibrillation (HCC) -CHA2DS2-VASc score 2 02/12/2020    Past Surgical History:  Procedure Laterality Date  . CARDIOVERSION N/A 04/17/2020   Procedure: CARDIOVERSION;  Surgeon: [name], MD;  Location: [place];  Service: Cardiovascular;  Laterality: N/A;  . CARDIOVERSION N/A 05/16/2020   Procedure: CARDIOVERSION;  Surgeon: [name], MD;  Location: [place];  Service: Cardiovascular;  Laterality: N/A;  . CARPAL TUNNEL RELEASE     right and left  . COLONOSCOPY    . CYST EXCISION     face  . EAR CYST EXCISION Left 02/11/2015   Procedure: EXCISION OF LEFT POST AURICULAR CYST;  Surgeon: [name], MD;  Location: [place];  Service: ENT;  Laterality: Left;  . INGUINAL HERNIA REPAIR     bilat-as infant  . NASAL SINUS SURGERY  2000       No family history on file.  Social History   Tobacco Use  . Smoking status: Never Smoker  . Smokeless tobacco: Never Used  Vaping Use  . Vaping Use: Never used  Substance Use Topics  . Alcohol use: Yes    Comment: occ  . Drug use: No    Home Medications Prior to Admission medications   Medication Sig Start Date End Date Taking? Authorizing Provider  amoxicillin-clavulanate (AUGMENTIN) 875-125 MG tablet Take 1 tablet by mouth 2 (two)  times daily for 10 days. 10/08/20 10/18/20  Marcello Fennel, PA-C  calcium gluconate 500 MG tablet Take 1 tablet by mouth daily.    [provider]  cetirizine (ZYRTEC) 10 MG tablet Take 10 mg by mouth daily.     [provider]  Cholecalciferol (VITAMIN D3) 50 MCG (2000 UT) TABS Take 2,000 Units by mouth daily.    [provider]  Cholecalciferol (VITAMIN D3) 50 MCG (2000 UT) TABS Take by mouth daily.    [provider]  dofetilide (TIKOSYN) 500 MCG capsule Take 1 capsule (500 mcg total) by mouth 2  (two) times daily. 05/17/20   Shirley Friar, PA-C  ezetimibe (ZETIA) 10 MG tablet Take 10 mg by mouth at bedtime.  12/07/19   [provider]  metoprolol succinate (TOPROL-XL) 100 MG 24 hr tablet Take 100 mg by mouth daily. 12/07/19   [provider]  metroNIDAZOLE (FLAGYL) 500 MG tablet Take 1 tablet (500 mg total) by mouth 3 (three) times daily for 10 days. 10/08/20 10/18/20  Marcello Fennel, PA-C  Multiple Minerals-Vitamins (CALCIUM-MAGNESIUM-ZINC-D3 PO) Take 1 tablet by mouth daily.     [provider]  omeprazole (PRILOSEC) 20 MG capsule Take 20 mg by mouth daily before breakfast.     [provider]  potassium chloride (KLOR-CON) 10 MEQ tablet Take 1 tablet (10 mEq total) by mouth daily. 05/18/20   Shirley Friar, PA-C  rivaroxaban (XARELTO) 20 MG TABS tablet Take 1 tablet (20 mg total) by mouth daily with supper. 06/18/20   Sherran Needs, NP  simvastatin (ZOCOR) 40 MG tablet Take 40 mg by mouth at bedtime. 12/07/19   [provider]  tamsulosin (FLOMAX) 0.4 MG CAPS capsule Take 0.4 mg by mouth in the morning and at bedtime.  12/07/19   [provider]    Allergies    Lisinopril  Review of Systems   Review of Systems  Constitutional: Negative for chills and fever.  HENT: Negative for congestion, tinnitus, trouble swallowing and voice change.   Respiratory: Negative for cough and shortness of breath.   Cardiovascular: Negative for chest pain.  Gastrointestinal: Positive for abdominal pain. Negative for constipation, diarrhea, nausea and vomiting.  Genitourinary: Negative for enuresis, flank pain and frequency.  Musculoskeletal: Negative for back pain.  Skin: Negative for rash.  Neurological: Negative for dizziness.  Hematological: Does not bruise/bleed easily.    Physical Exam Updated Vital Signs BP 137/72 (BP Location: Right Arm)   Pulse (!) 56   Temp 98.1 F (36.7 C) (Oral)   Resp 18   Ht 6' (1.829 m)   Wt  (!) 175.1 kg   SpO2 100%   BMI 52.35 kg/m   Physical Exam Vitals and nursing note reviewed.  Constitutional:      General: He is not in acute distress.    Appearance: He is not ill-appearing.  HENT:     Head: Normocephalic and atraumatic.     Nose: No congestion.  Eyes:     Conjunctiva/sclera: Conjunctivae normal.  Cardiovascular:     Rate and Rhythm: Normal rate and regular rhythm.     Pulses: Normal pulses.     Heart sounds: No murmur heard.  No friction rub. No gallop.   Pulmonary:     Effort: No respiratory distress.     Breath sounds: No wheezing, rhonchi or rales.  Abdominal:     General: There is no distension.     Palpations: Abdomen is soft.  Tenderness: There is abdominal tenderness. There is no right CVA tenderness, left CVA tenderness or guarding.     Comments: Patient's abdomen was visualized, it was nondistended, normal active bowel sounds, dull to percussion.  He had slight right upper abdominal tenderness with more left lower abdominal tenderness.  No rebound tenderness, no peritoneal sign noted.  No CVA tenderness  Skin:    General: Skin is warm and dry.  Neurological:     Mental Status: He is alert.  Psychiatric:        Mood and Affect: Mood normal.     ED Results / Procedures / Treatments   Labs (all labs ordered are listed, but only abnormal results are displayed) Labs Reviewed  URINALYSIS, ROUTINE W REFLEX MICROSCOPIC - Abnormal; Notable for the following components:      Result Value   Specific Gravity, Urine <1.005 (*)    All other components within normal limits  LIPASE, BLOOD  COMPREHENSIVE METABOLIC PANEL  CBC    EKG EKG Interpretation  Date/Time:  Tuesday October 08 2020 12:15:55 EST Ventricular Rate:  56 PR Interval:  196 QRS Duration: 100 QT Interval:  460 QTC Calculation: 443 R Axis:   43 Text Interpretation: Sinus bradycardia Otherwise normal ECG No significant change since prior 6/21 Confirmed by Aletta Edouard [phone])  on 10/08/2020 12:34:29 PM   Radiology CT Abdomen Pelvis W Contrast  Result Date: 10/08/2020 CLINICAL DATA:  Acute left lower quadrant abdominal pain. EXAM: CT ABDOMEN AND PELVIS WITH CONTRAST TECHNIQUE: Multidetector CT imaging of the abdomen and pelvis was performed using the standard protocol following bolus administration of intravenous contrast. CONTRAST:  169mL OMNIPAQUE IOHEXOL 300 MG/ML  SOLN COMPARISON:  Apr 05, 2010. FINDINGS: Lower chest: No acute abnormality. Hepatobiliary: Cholelithiasis is noted. No biliary dilatation is noted. The liver is unremarkable. Pancreas: Unremarkable. No pancreatic ductal dilatation or surrounding inflammatory changes. Spleen: Normal in size without focal abnormality. Adrenals/Urinary Tract: Adrenal glands are unremarkable. Kidneys are normal, without renal calculi, focal lesion, or hydronephrosis. Bladder is unremarkable. Stomach/Bowel: The stomach appears normal. The appendix is not visualized. There is no evidence of bowel obstruction. However, there appears to be focal diverticulitis involving the proximal descending colon without abscess formation. Vascular/Lymphatic: Aortic atherosclerosis. No enlarged abdominal or pelvic lymph nodes. Reproductive: Stable mild prostatic enlargement. Other: No abdominal wall hernia or abnormality. No abdominopelvic ascites. Musculoskeletal: No acute or significant osseous findings. IMPRESSION: 1. Focal diverticulitis is seen involving the proximal descending colon without abscess formation. 2. Cholelithiasis. 3. Stable mild prostatic enlargement. 4. Aortic atherosclerosis. Aortic Atherosclerosis (ICD10-I70.0). Electronically Signed   By: Marijo Conception M.D.   On: 10/08/2020 15:53    Procedures Procedures (including critical care time)  Medications Ordered in ED Medications  iohexol (OMNIPAQUE) 300 MG/ML solution 100 mL (100 mLs Intravenous Contrast Given 10/08/20 1532)    ED Course  I have reviewed the triage vital signs  and the nursing notes.  Pertinent labs & imaging results that were available during my care of the patient were reviewed by me and considered in my medical decision making (see chart for details).    MDM Rules/Calculators/A&P                          Patient presents with left-sided abdominal pain.  He is alert, does not appear in acute distress, vital signs reassuring.  Will order basic lab work and reassess.  CBC negative for leukocytosis or signs of anemia, CMP negative for  electrolyte abnormalities, no metabolic acidosis, no AKI, no anion gap noted.  UA negative for nitrates or leukocytes no hematuria noted.  Lipase is 23.  Due to patient's exquisite left lower abdominal pain will order abdomen pelvis for further evaluation.  CT abdomen pelvis shows focal diverticulitis in the proximal descending colon without abscess formation.  Cholelithiasis, stable prostatic enlargement.  Low suspicion for systemic infection as patient is nontoxic-appearing, vital signs reassuring. Low suspicion for pancreatitis, hepatic or biliary abnormalities, bowel obstruction, bowel perforation, colitis, appendicitis as there is no acute abdomen on exam, no elevation in liver enzymes or alk phos, lipase is 23. Low suspicion for UTI or pyelonephritis as patient denies urinary symptoms, negative CVA tenderness, UA negative for nitrates or leukocytes, no hematuria noted on exam.  Low suspicion patient would need to be hospitalized due to diverticulitis as there is no abscess noted on CT imaging, no leukocytosis noted on CBC, patient is tolerating p.o. without difficulty.  Will start patient on antibiotics encourage a soft diet and have him follow-up with PCP for further evaluation.  Vital signs have remained stable, no indication for hospital admission. Patient given at home care as well strict return precautions.  Patient verbalized that they understood agreed to said plan.     Final Clinical Impression(s) / ED  Diagnoses Final diagnoses:  Diverticulitis    Rx / DC Orders ED Discharge Orders         Ordered    metroNIDAZOLE (FLAGYL) 500 MG tablet  3 times daily        10/08/20 1612    amoxicillin-clavulanate (AUGMENTIN) 875-125 MG tablet  2 times daily        10/08/20 1612           Namon, Villarin, PA-C 10/08/20 1619    Hayden Rasmussen, MD 10/08/20 [phone]

## 2020-10-08 NOTE — ED Notes (Signed)
Taken to CT at this time. 

## 2020-10-08 NOTE — ED Notes (Signed)
Pt on monitor and cycling vitals

## 2020-10-09 DIAGNOSIS — Z125 Encounter for screening for malignant neoplasm of prostate: Secondary | ICD-10-CM | POA: Diagnosis not present

## 2020-10-16 ENCOUNTER — Ambulatory Visit (HOSPITAL_COMMUNITY): Payer: Medicare Other | Admitting: Nurse Practitioner

## 2020-10-16 DIAGNOSIS — R351 Nocturia: Secondary | ICD-10-CM | POA: Diagnosis not present

## 2020-10-16 DIAGNOSIS — N5201 Erectile dysfunction due to arterial insufficiency: Secondary | ICD-10-CM | POA: Diagnosis not present

## 2020-10-16 DIAGNOSIS — N401 Enlarged prostate with lower urinary tract symptoms: Secondary | ICD-10-CM | POA: Diagnosis not present

## 2020-12-09 DIAGNOSIS — Z20828 Contact with and (suspected) exposure to other viral communicable diseases: Secondary | ICD-10-CM | POA: Diagnosis not present

## 2020-12-17 ENCOUNTER — Other Ambulatory Visit: Payer: Self-pay | Admitting: Student

## 2020-12-18 MED ORDER — DOFETILIDE 500 MCG PO CAPS: 500.0000 ug | ORAL_CAPSULE | Freq: Two times a day (BID) | ORAL | 9 refills | Status: DC

## 2020-12-18 NOTE — Addendum Note (Signed)
Addended by: Carter Kitten D on: 12/18/2020 08:39 AM   Modules accepted: Orders

## 2020-12-24 DIAGNOSIS — E7800 Pure hypercholesterolemia, unspecified: Secondary | ICD-10-CM | POA: Diagnosis not present

## 2020-12-24 DIAGNOSIS — I10 Essential (primary) hypertension: Secondary | ICD-10-CM | POA: Diagnosis not present

## 2020-12-24 DIAGNOSIS — N400 Enlarged prostate without lower urinary tract symptoms: Secondary | ICD-10-CM | POA: Diagnosis not present

## 2020-12-24 DIAGNOSIS — I4891 Unspecified atrial fibrillation: Secondary | ICD-10-CM | POA: Diagnosis not present

## 2020-12-24 DIAGNOSIS — K219 Gastro-esophageal reflux disease without esophagitis: Secondary | ICD-10-CM | POA: Diagnosis not present

## 2020-12-24 DIAGNOSIS — N401 Enlarged prostate with lower urinary tract symptoms: Secondary | ICD-10-CM | POA: Diagnosis not present

## 2021-01-10 ENCOUNTER — Other Ambulatory Visit: Payer: Self-pay

## 2021-01-10 ENCOUNTER — Encounter: Payer: Self-pay | Admitting: Cardiology

## 2021-01-10 ENCOUNTER — Ambulatory Visit (INDEPENDENT_AMBULATORY_CARE_PROVIDER_SITE_OTHER): Payer: Medicare Other | Admitting: Cardiology

## 2021-01-10 VITALS — BP 152/80 | HR 53 | Ht 72.0 in | Wt 387.0 lb

## 2021-01-10 DIAGNOSIS — I480 Paroxysmal atrial fibrillation: Secondary | ICD-10-CM | POA: Diagnosis not present

## 2021-01-10 DIAGNOSIS — R600 Localized edema: Secondary | ICD-10-CM | POA: Diagnosis not present

## 2021-01-10 DIAGNOSIS — D6869 Other thrombophilia: Secondary | ICD-10-CM | POA: Diagnosis not present

## 2021-01-10 DIAGNOSIS — I10 Essential (primary) hypertension: Secondary | ICD-10-CM | POA: Diagnosis not present

## 2021-01-10 DIAGNOSIS — Z6841 Body Mass Index (BMI) 40.0 and over, adult: Secondary | ICD-10-CM

## 2021-01-10 DIAGNOSIS — E7849 Other hyperlipidemia: Secondary | ICD-10-CM

## 2021-01-10 MED ORDER — VALSARTAN 80 MG PO TABS: 80.0000 mg | ORAL_TABLET | Freq: Every day | ORAL | 3 refills | Status: DC

## 2021-01-10 NOTE — Progress Notes (Signed)
Primary Care Provider: [name], MD Cardiologist: [name], MD Electrophysiologist: [name], MD/M. Joesph July, PA  Clinic Note: Chief Complaint  Patient presents with  . Follow-up    6 months.  Doing relatively well.  . Atrial Fibrillation    No sense of being in A. fib, only notes occasional off-and-on spells about 30 to 45 seconds of chest discomfort/fluttering.  No bleeding.  [name] Hypertension    Blood pressure now higher off of lisinopril.   ===================================  ASSESSMENT/PLAN   Problem List Items Addressed This Visit    Secondary hypercoagulable state ([place])    CHA2DS2-VASc score 3-on Xarelto.  No bleeding      Relevant Orders   EKG 12-Lead (Completed)   Paroxysmal (persistent) atrial fibrillation (HCC) -CHA2DS2-VASc score 3 (HTN, 69, Aortic Atherosclerosis noted on CT) - Primary (Chronic)    He had pretty persistent A. fib when he came, but now maintaining sinus rhythm as far as I can tell on Tikosyn.  Hypothyroid as result of being on Tikosyn, we are avoiding diuretic.  We will defer management of antiarrhythmic agent to EP.  CHA2DS2-VASc score 3: Continue rivaroxaban/Xarelto for stroke prevention.      Relevant Medications   valsartan (DIOVAN) 80 MG tablet   Other Relevant Orders   EKG 12-Lead (Completed)   Morbid obesity with BMI of 50.0-59.9, adult (HCC) (Chronic)    He had been doing well with weight loss, but lost around.  Encouraged him to get back into his exercise , And adjust his diet.  This is perhaps his largest risk going forward.      Essential hypertension (Chronic)    Blood pressure is much higher today than it had been.  It seems to be that this is baseline home blood pressure as well.  81-1 40s at home.  We are avoiding diuretics because of Tikosyn.  Plan: Continue current dose of Toprol, will add valsartan 80 mg (this is to avoid potential side effects overlap with losartan to lisinopril with cough) -> He  will need labs be checked closely by his PCP (due for labs recheck soon anyway.)        Relevant Medications   valsartan (DIOVAN) 80 MG tablet   Other Relevant Orders   EKG 12-Lead (Completed)   Hyperlipidemia due to dietary fat intake (Chronic)    Labs look pretty good on current dose of the simvastatin.  Due for labs recheck soon by PCP.  Will need to have chemistry panel checked as well for new start ARB.      Relevant Medications   valsartan (DIOVAN) 80 MG tablet   Bilateral leg edema (Chronic)    Suspect his edema is related to venous stasis and probably evidence of obesity/OSA related elevated pulmonary pressures.  Recommendation is to continue foot elevation and try to wear support stockings.  Trying to avoid diuretic while on Tikosyn.         ===================================  HPI:    Christopher Hanson is a 70 y.o. male with a PMH notable for Hypertension, A. fib (on Tikosyn) and Obesity who presents today for 55-month follow-up.  History of A. Fib:  February 15, 2019: Noted to be in A. fib at PCPs office. = Referred to A. fib clinic>   Diagnosed with OSA and started on CPAP; cut back caffeine.  Unsuccessful DCCV in May 2021  June 2021: Admitted to start on Tikosyn 500 mcg twice daily along with Toprol 100 mg daily-not ablation candidate secondary to weight  and severely dilation.  This is likely related on May 16, 2020  Atha Mcbain was seen on 07/08/2020, to establish "General Cardiology "provider.  Referred by Roderic Palau, NP, and Joesph July, PA (following July 14 visit) he noted some chest discomfort associated with his rowing machine, otherwise no real true anginal symptoms.  Mild ankle edema. ACE inhibitor after initially converting from Maxide to lisinopril (to avoid electrolyte normalities on diuretic), blood pressures were too low, so ACE inhibitor stopped.--Actually cough improved as well.. -> Has baseline exertional dyspnea from obesity, but no chest  pain or pressure.  Tolerating CPAP, sleeping better.  Indicated that he had lost 80 pounds from going to the gym etc., but then will be Covid lockdown, he was not able to go to the gym-he gained about 36 pounds.  Also he hurt his back.  A. fib clinic 09/24/2020-> noted some weight gain, but otherwise doing well.  Not as active during Covid lockdown.  Recent Hospitalizations:   [facility] 10/08/2020: Sudden onset left-sided abdominal pain possibly throbbing.  Waxing and waning.  Pain here for the upper lower abdomen.  No nausea or vomiting.  CT abdomen pelvis showed focal diverticulitis in proximal descending colon without abscess.  Treated with Augmentin and Flagyl  Reviewed  CV studies:    The following studies were reviewed today: (if available, images/films reviewed: From Epic Chart or Care Everywhere) . None:  Interval History:   Piercen Covino returns today overall doing relatively well from a cardiac standpoint .  He denies any chest pain or pressure with exertion, does have exertional dyspnea.  Usually this if he tries to stop it too fast.  He has occasional off-and-on chest discomfort that occurs at rest, and to be a little spurts of 30 to 45 seconds.  Nothing prolonged and not made worse by exertion.  This may be the only sensation that he has that could be related to A. fib.  But he really has not noticed any irregular heartbeat since his cardioversion.  What he does note is that his energy level is better and has less exertional dyspnea.  He sleeps relatively well on CPAP.  His balance is pretty bad.  If he turns his head too fast or does not do too quickly can get very dizzy.  About a week ago, he fell while walking, tripped over something that was on the ground because he lost view of his feet.  Thankfully he did not hurt himself.  He does have some swelling left greater than right foot/ankle swelling.  Usually goes down he puts his feet up.  CV Review of Symptoms  (Summary): positive for - chest pain, dyspnea on exertion, edema, orthopnea, palpitations and Without CPAP he has orthopnea.  Short bursts of fluttering noted above no prolonged palpitations. negative for - paroxysmal nocturnal dyspnea, rapid heart rate, shortness of breath or Lightheadedness, dizziness or syncope/near syncope; TIA/amaurosis fugax or claudication.  The patient does not have symptoms concerning for COVID-19 infection (fever, chills, cough, or new shortness of breath).   REVIEWED OF SYSTEMS   Review of Systems  Constitutional: Negative for malaise/fatigue (He does not have a lot of energy, but it is better now that it.  Had been) and weight loss (Unfortunately, his weight loss attempts have been thwarted).  HENT: Negative for congestion and nosebleeds.   Respiratory: Positive for shortness of breath (Per HPI). Negative for cough.   Cardiovascular: Positive for leg swelling (Per HPI). Negative for chest pain (  The chest pain that he had from the rowing machine is better.).  Gastrointestinal: Negative for blood in stool, constipation, melena and vomiting.       He has recovered from his diverticulitis, no further abdominal pain, and no blood in the stool.  Genitourinary: Negative for hematuria.  Musculoskeletal: Positive for back pain and joint pain (Shoulder).  Neurological: Positive for dizziness (Poor balance). Negative for focal weakness and weakness.  Psychiatric/Behavioral: Negative for memory loss. The patient is not nervous/anxious and does not have insomnia.    I have reviewed and (if needed) personally updated the patient's problem list, medications, allergies, past medical and surgical history, social and family history.   PAST MEDICAL HISTORY   Past Medical History:  Diagnosis Date  . GERD (gastroesophageal reflux disease)   . Hyperlipemia   . Hypertension   . Morbidly obese (HCC)    BMI 50  . OSA on CPAP 03/2020   Diagnosed April-May 2021  . Paroxysmal atrial  fibrillation ([place]) 01/2018   Persistent paroxysmal A. fib; s/p facilitated DCCV while on Tikosyn (May 16, 2020)  . Seasonal allergies   . Wears glasses     PAST SURGICAL HISTORY   Past Surgical History:  Procedure Laterality Date  . CARDIOVERSION N/A 04/17/2020   Procedure: CARDIOVERSION;  Surgeon: [name], MD;  Location: [facility];  Service: Cardiovascular;  Laterality: N/A;  . CARDIOVERSION N/A 05/16/2020   Procedure: CARDIOVERSION;  Surgeon: [name], MD;  Location: [facility];  Service: Cardiovascular;  Laterality: N/A;  . CARPAL TUNNEL RELEASE     right and left  . COLONOSCOPY    . CYST EXCISION     face  . EAR CYST EXCISION Left 02/11/2015   Procedure: EXCISION OF LEFT POST AURICULAR CYST;  Surgeon: [name], MD;  Location: [facility];  Service: ENT;  Laterality: Left;  . INGUINAL HERNIA REPAIR     bilat-as infant  . NASAL SINUS SURGERY  2000   A. fib therapy:   04/24/2020: Unsuccessful DCCV  6/15-182021 - started Jerry Caras & 2nd DCCV 05/16/2020 attempt successful  Reviewed  CV studies:  The following studies were reviewed today: (if available, images/films reviewed: From Epic Chart or Care Everywhere)  Echo 02/20/2020: EF 55-60%. No RWMA. Mod LVH. Unable to determine Diastolic Pressrue. Severe LA dilation, Mod RA dilation. Mildly dilated SVC - suggests RAP/CVP ~8 mmHg.  04/24/2020-unsuccessful DCCV  05/16/2020-successful DCCV   Immunization History  Administered Date(s) Administered  . Influenza Split 08/30/2013  . Moderna Sars-Covid-2 Vaccination 01/12/2020, 02/09/2020    MEDICATIONS/ALLERGIES   Current Meds  Medication Sig  . azelastine (ASTELIN) 0.1 % nasal spray as needed.  . calcium gluconate 500 MG tablet Take 1 tablet by mouth daily.  . cetirizine (ZYRTEC) 10 MG tablet Take 10 mg by mouth daily.   . Cholecalciferol (VITAMIN D3) 50 MCG (2000 UT) TABS Take 2,000 Units by mouth daily.  [name] dofetilide (TIKOSYN)  500 MCG capsule Take 1 capsule (500 mcg total) by mouth 2 (two) times daily.  [name] ezetimibe (ZETIA) 10 MG tablet Take 10 mg by mouth at bedtime.   . metoprolol succinate (TOPROL-XL) 100 MG 24 hr tablet Take 100 mg by mouth daily.  . Multiple Vitamins-Minerals (MENS MULTIVITAMIN) TABS daily.  [name] omeprazole (PRILOSEC) 20 MG capsule Take 20 mg by mouth daily before breakfast.   . potassium chloride (KLOR-CON) 10 MEQ tablet Take 1 tablet (10 mEq total) by mouth daily.  . rivaroxaban (XARELTO) 20 MG TABS tablet  Take 1 tablet (20 mg total) by mouth daily with supper.  . simvastatin (ZOCOR) 40 MG tablet Take 40 mg by mouth at bedtime.  . tamsulosin (FLOMAX) 0.4 MG CAPS capsule Take 0.4 mg by mouth in the morning and at bedtime.   . valsartan (DIOVAN) 80 MG tablet Take 1 tablet (80 mg total) by mouth daily.    Allergies  Allergen Reactions  . Lisinopril Cough    SOCIAL HISTORY/FAMILY HISTORY   Reviewed in Epic:  Pertinent findings:  Social History   Tobacco Use  . Smoking status: Never [place]  . Smokeless tobacco: Never Used  Vaping Use  . Vaping Use: Never used  Substance Use Topics  . Alcohol use: Yes    Comment: occ  . Drug use: No   Social History   Social History Narrative  . Not on file    OBJCTIVE -PE, EKG, labs   Wt Readings from Last 3 Encounters:  01/10/21 (!) 387 lb (175.5 kg)  10/08/20 (!) 386 lb (175.1 kg)  09/24/20 (!) 384 lb (174.2 kg)    Physical Exam: BP (!) 152/80   Pulse (!) 53   Ht 6' (1.829 m)   Wt (!) 387 lb (175.5 kg)   SpO2 98%   BMI 52.49 kg/m  Physical Exam Vitals reviewed.  Constitutional:      General: He is not in acute distress.    Appearance: Normal appearance. He is obese. He is not toxic-appearing or diaphoretic (sweaty).     Comments: Super morbidly obese  HENT:     Head: Normocephalic and atraumatic.  Neck:     Vascular: No carotid bruit or JVD (Unable to assess).  Cardiovascular:     Rate and Rhythm: Regular rhythm.  Bradycardia present.  No extrasystoles are present.    Chest Wall: PMI is not displaced (Unable to assess).     Pulses: Intact distal pulses. Decreased pulses (Unable to palpate due to body habitus.).     Heart sounds: S1 normal and S2 normal. Heart sounds are distant. No murmur heard. No friction rub. No gallop. No S4 sounds.   Pulmonary:     Effort: Pulmonary effort is normal. No respiratory distress.     Breath sounds: Normal breath sounds.     Comments: Distant breath sounds Chest:     Chest wall: No tenderness.  Musculoskeletal:        General: Swelling (Diffuse puffy swelling, not really true edema.) present.     Cervical back: Normal range of motion and neck supple.     Right lower leg: Edema (1-2+) present.     Left lower leg: Edema (2-3+) present.  Skin:    General: Skin is warm.     Coloration: Skin is not pale.     Comments: Sweaty  Neurological:     General: No focal deficit present.     Mental Status: He is alert and oriented to person, place, and time.  Psychiatric:        Mood and Affect: Mood normal.        Behavior: Behavior normal.        Thought Content: Thought content normal.        Judgment: Judgment normal.    Adult ECG Report  Rate: 53 ;  Rhythm: sinus bradycardia and Otherwise normal axis, intervals and durations.;   Narrative Interpretation: Stable  Recent Labs: Reviewed-doing to be rechecked by PCP soon.  02/15/2020: TC 140, TG 99, HDL 44, LDL 77.  TSH 1.32.  No results found for: CHOL, HDL, LDLCALC, LDLDIRECT, TRIG, CHOLHDL Lab Results  Component Value Date   CREATININE 1.02 10/08/2020   BUN 14 10/08/2020   NA 138 10/08/2020   K 4.1 10/08/2020   CL 103 10/08/2020   CO2 26 10/08/2020   CBC Latest Ref Rng & Units 10/08/2020 09/24/2020 04/10/2020  WBC 4.0 - 10.5 K/uL 7.1 5.6 5.8  Hemoglobin 13.0 - 17.0 g/dL 13.3 13.5 15.2  Hematocrit 39.0 - 52.0 % 39.8 39.6 45.6  Platelets 150 - 400 K/uL 164 181 199    No results found for:  TSH  ==================================================  COVID-19 Education: The signs and symptoms of COVID-19 were discussed with the patient and how to seek care for testing (follow up with PCP or arrange E-visit).   The importance of social distancing and COVID-19 vaccination was discussed today. The patient is practicing social distancing & Masking.   I spent a total of 21 minutes with the patient spent in direct patient consultation.  Additional time spent with chart review  / charting (studies, outside notes, etc): 12 min Total Time: 33 min   Current medicines are reviewed at length with the patient today.  (+/- concerns) none  This visit occurred during the SARS-CoV-2 public health emergency.  Safety protocols were in place, including screening questions prior to the visit, additional usage of staff PPE, and extensive cleaning of exam room while observing appropriate contact time as indicated for disinfecting solutions.  Notice: This dictation was prepared with Dragon dictation along with smaller phrase technology. Any transcriptional errors that result from this process are unintentional and may not be corrected upon review.  Patient Instructions / Medication Changes & Studies & Tests Ordered   Patient Instructions  Medication Instructions:   Start taking Valsartan 80 mg one tablet daily    *If you need a refill on your cardiac medications before your next appointment, please call your pharmacy*   Lab Work:  please have primary Dr Tamala Julian check  A lab [facility]   If you have labs (blood work) drawn today and your tests are completely normal, you will receive your results only by: [name] MyChart Message (if you have MyChart) OR . A paper copy in the mail If you have any lab test that is abnormal or we need to change your treatment, we will call you to review the results.   Testing/Procedures:  Not needed  Follow-Up: At [facility], you and your health needs are our priority.  As part of our continuing mission to provide you with exceptional heart care, we have created designated Provider Care Teams.  These Care Teams include your primary Cardiologist (physician) and Advanced Practice Providers (APPs -  Physician Assistants and Nurse Practitioners) who all work together to provide you with the care you need, when you need it.     Your next appointment:   6 month(s)  The format for your next appointment:   In Person  Provider:   [name], MD  #1 normalStudies Ordered:   Orders Placed This Encounter  Procedures  . EKG 12-Lead     [name], M.D., M.S. Interventional Cardiologist   Pager # [phone] Phone # [phone] [address] [place], [place] [address]   Thank you for choosing Heartcare at [facility]!!

## 2021-01-10 NOTE — Patient Instructions (Addendum)
Medication Instructions:   Start taking Valsartan 80 mg one tablet daily    *If you need a refill on your cardiac medications before your next appointment, please call your pharmacy*   Lab Work:  please have primary Dr Tamala Julian check  A lab CMP NEXT MONTH SINCE YOU ARE STARTING VALSARTAN   If you have labs (blood work) drawn today and your tests are completely normal, you will receive your results only by: [name] MyChart Message (if you have MyChart) OR . A paper copy in the mail If you have any lab test that is abnormal or we need to change your treatment, we will call you to review the results.   Testing/Procedures:  Not needed  Follow-Up: At [facility], you and your health needs are our priority.  As part of our continuing mission to provide you with exceptional heart care, we have created designated Provider Care Teams.  These Care Teams include your primary Cardiologist (physician) and Advanced Practice Providers (APPs -  Physician Assistants and Nurse Practitioners) who all work together to provide you with the care you need, when you need it.     Your next appointment:   6 month(s)  The format for your next appointment:   In Person  Provider:   [name], MD

## 2021-02-02 ENCOUNTER — Encounter: Payer: Self-pay | Admitting: Cardiology

## 2021-02-02 NOTE — Assessment & Plan Note (Signed)
CHA2DS2-VASc score 3-on Xarelto.  No bleeding

## 2021-02-02 NOTE — Assessment & Plan Note (Signed)
Blood pressure is much higher today than it had been.  It seems to be that this is baseline home blood pressure as well.  81-1 40s at home.  We are avoiding diuretics because of Tikosyn.  Plan: Continue current dose of Toprol, will add valsartan 80 mg (this is to avoid potential side effects overlap with losartan to lisinopril with cough) -> He will need labs be checked closely by his PCP (due for labs recheck soon anyway.)

## 2021-02-02 NOTE — Assessment & Plan Note (Signed)
Suspect his edema is related to venous stasis and probably evidence of obesity/OSA related elevated pulmonary pressures.  Recommendation is to continue foot elevation and try to wear support stockings.  Trying to avoid diuretic while on Tikosyn.

## 2021-02-02 NOTE — Assessment & Plan Note (Addendum)
He had pretty persistent A. fib when he came, but now maintaining sinus rhythm as far as I can tell on Tikosyn.  Hypothyroid as result of being on Tikosyn, we are avoiding diuretic.  We will defer management of antiarrhythmic agent to EP.  CHA2DS2-VASc score 3: Continue rivaroxaban/Xarelto for stroke prevention.

## 2021-02-02 NOTE — Assessment & Plan Note (Addendum)
Labs look pretty good on current dose of the simvastatin.  Due for labs recheck soon by PCP.  Will need to have chemistry panel checked as well for new start ARB.

## 2021-02-02 NOTE — Assessment & Plan Note (Signed)
He had been doing well with weight loss, but lost around.  Encouraged him to get back into his exercise , And adjust his diet.  This is perhaps his largest risk going forward.

## 2021-02-10 DIAGNOSIS — I4891 Unspecified atrial fibrillation: Secondary | ICD-10-CM | POA: Diagnosis not present

## 2021-02-10 DIAGNOSIS — N400 Enlarged prostate without lower urinary tract symptoms: Secondary | ICD-10-CM | POA: Diagnosis not present

## 2021-02-10 DIAGNOSIS — N401 Enlarged prostate with lower urinary tract symptoms: Secondary | ICD-10-CM | POA: Diagnosis not present

## 2021-02-10 DIAGNOSIS — I10 Essential (primary) hypertension: Secondary | ICD-10-CM | POA: Diagnosis not present

## 2021-02-10 DIAGNOSIS — K219 Gastro-esophageal reflux disease without esophagitis: Secondary | ICD-10-CM | POA: Diagnosis not present

## 2021-02-10 DIAGNOSIS — E7800 Pure hypercholesterolemia, unspecified: Secondary | ICD-10-CM | POA: Diagnosis not present

## 2021-02-11 ENCOUNTER — Other Ambulatory Visit: Payer: Self-pay | Admitting: Student

## 2021-02-13 IMAGING — CT CT ABD-PELV W/ CM
2 of 5 series · 16 of 46 positions shown, 18 images · IV contrast (Omnipaque)
Comparison: April 05, 2010.

CLINICAL DATA: Acute left lower quadrant abdominal pain.

EXAM:
CT ABDOMEN AND PELVIS WITH CONTRAST
TECHNIQUE: Multidetector CT imaging of the abdomen and pelvis was performed
using the standard protocol following bolus administration of
intravenous contrast.
CONTRAST:  100mL OMNIPAQUE IOHEXOL 300 MG/ML  SOLN

[Series 2: axial st · axial · 0.98mm/px · z∈[-652,-18]mm · 13 of 143 slices shown, 15 images]
[im 8/143  soft-tissue]
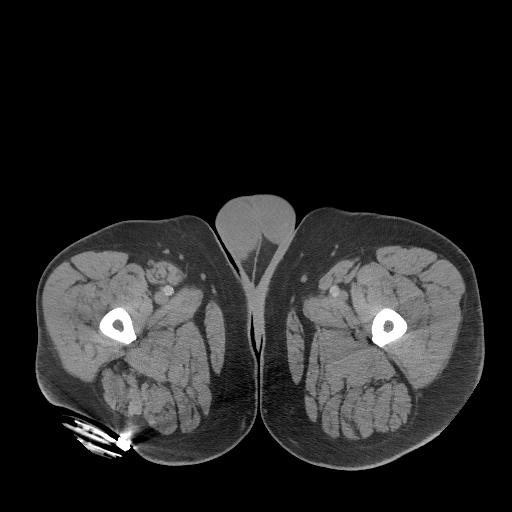
[im 8/143  bone]
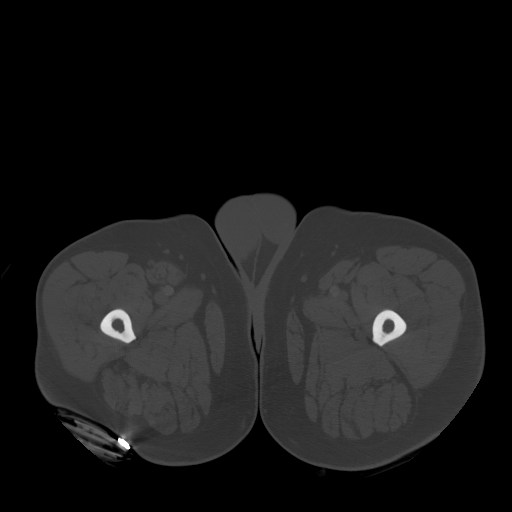
[im 22/143  soft-tissue]
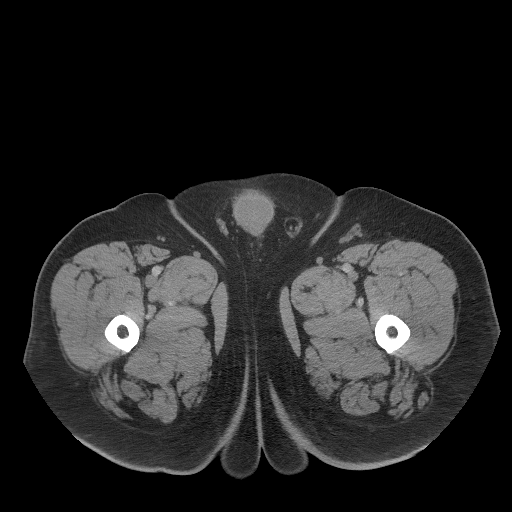
[im 29/143  soft-tissue]
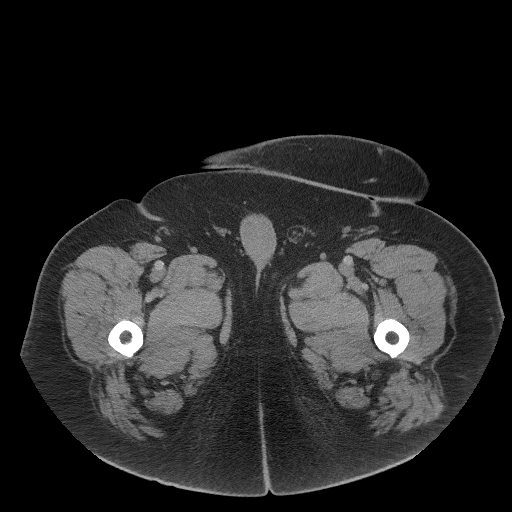
[im 43/143  soft-tissue]
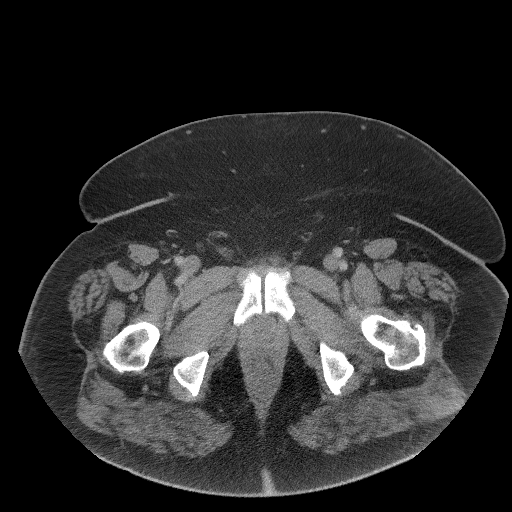
[im 50/143  soft-tissue]
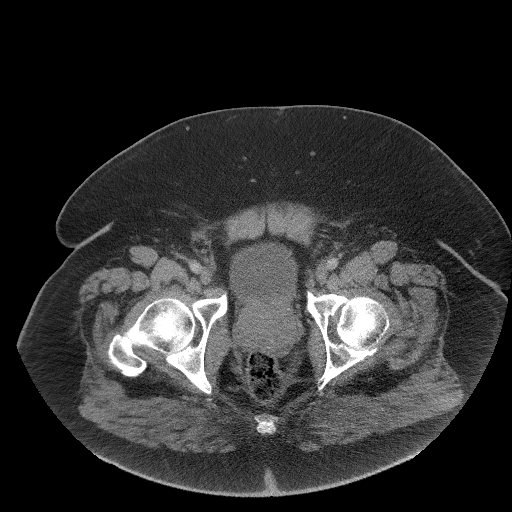
[im 64/143  soft-tissue]
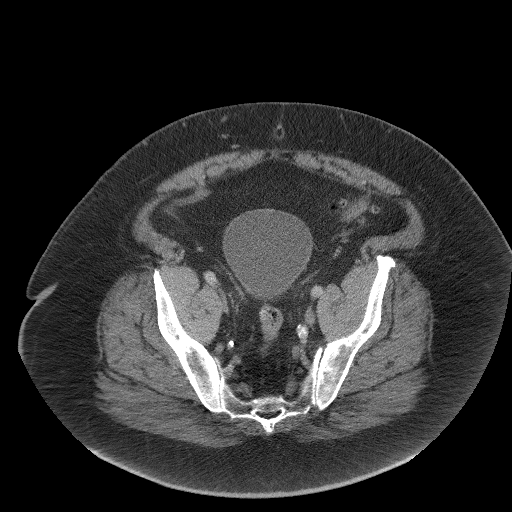
[im 72/143  soft-tissue]
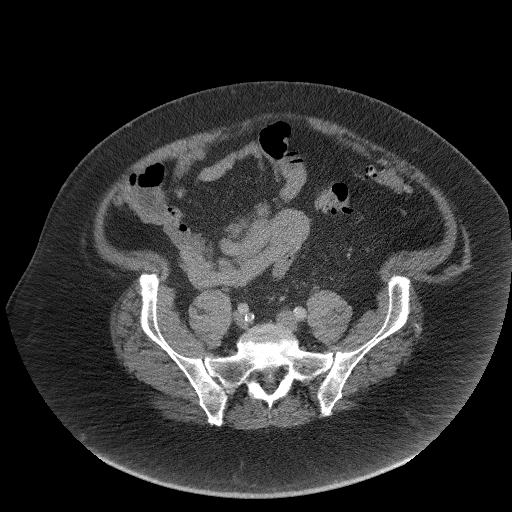
[im 79/143  soft-tissue]
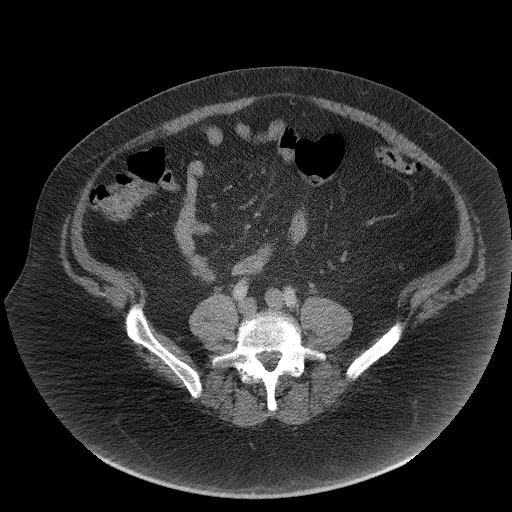
[im 93/143  soft-tissue]
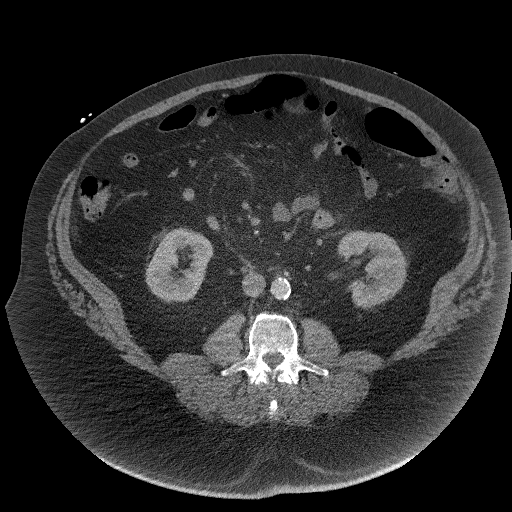
[im 93/143  bone]
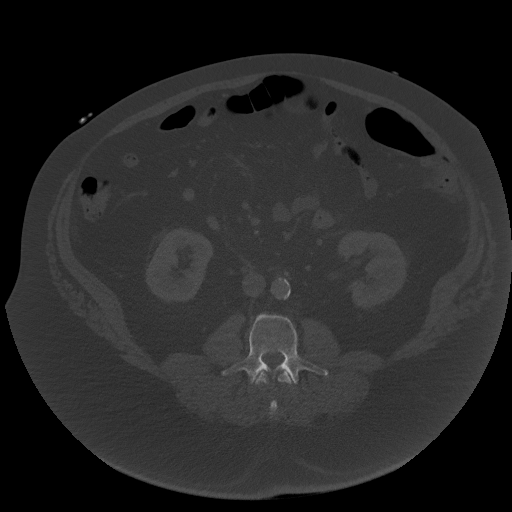
[im 100/143  soft-tissue]
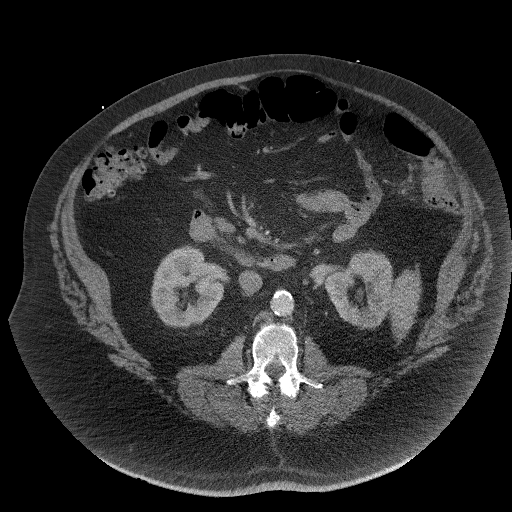
[im 114/143  soft-tissue]
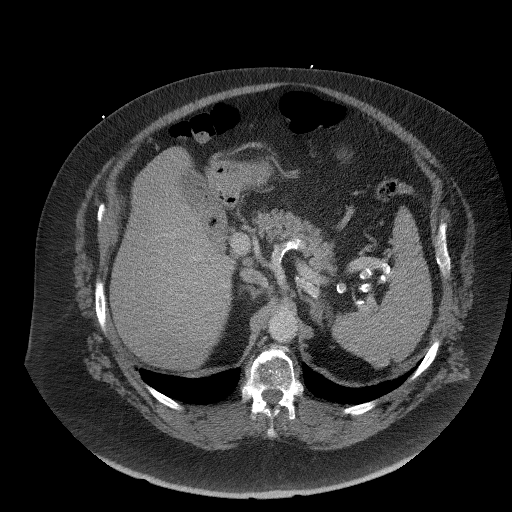
[im 121/143  soft-tissue]
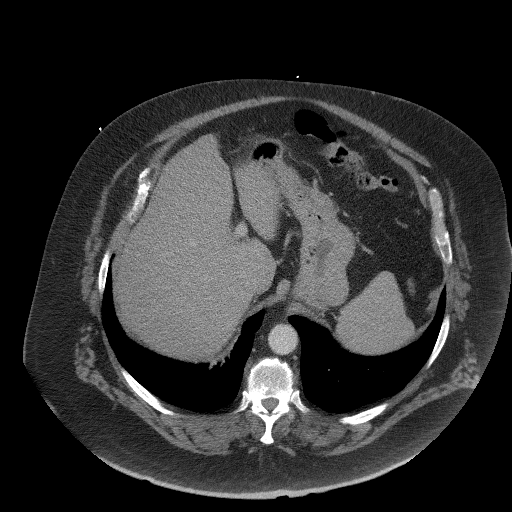
[im 135/143  soft-tissue]
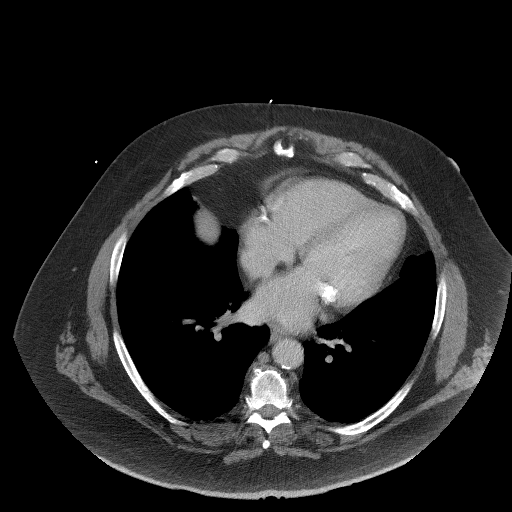

[Series 5: coronal st · coronal · 1.08mm/px · 3 of 139 slices shown]
[im 47/139  soft-tissue]
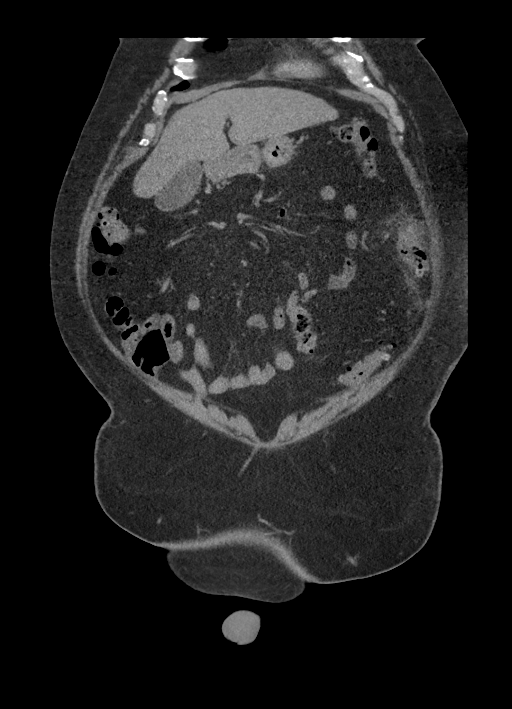
[im 62/139  soft-tissue]
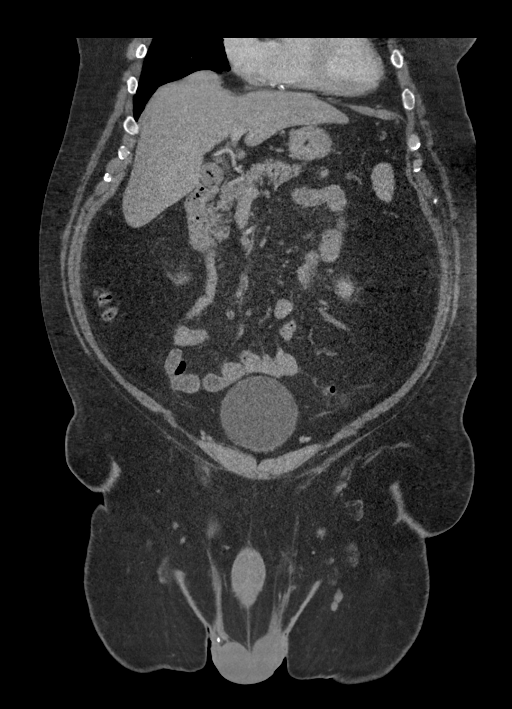
[im 77/139  soft-tissue]
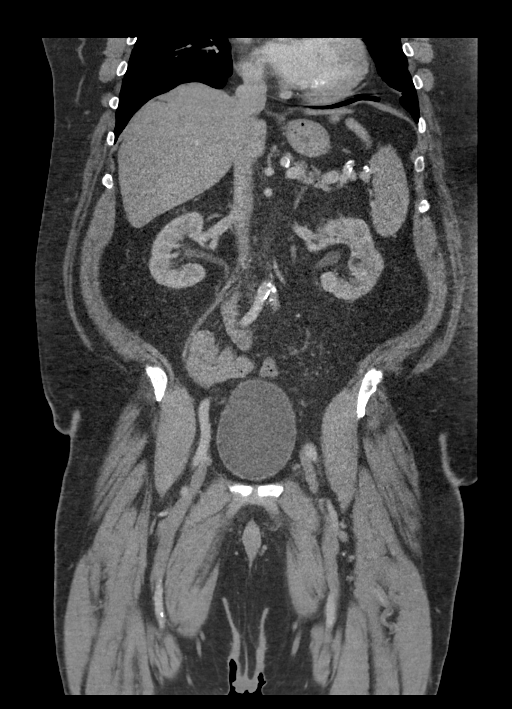

[16 of 46 positions shown; findings below may reference images not displayed]

FINDINGS: Lower chest: No acute abnormality.

Hepatobiliary: Cholelithiasis is noted. No biliary dilatation is
noted. The liver is unremarkable.

Pancreas: Unremarkable. No pancreatic ductal dilatation or
surrounding inflammatory changes.

Spleen: Normal in size without focal abnormality.

Adrenals/Urinary Tract: Adrenal glands are unremarkable. Kidneys are
normal, without renal calculi, focal lesion, or hydronephrosis.
Bladder is unremarkable.

Stomach/Bowel: The stomach appears normal. The appendix is not
visualized. There is no evidence of bowel obstruction. However,
there appears to be focal diverticulitis involving the proximal
descending colon without abscess formation.

Vascular/Lymphatic: Aortic atherosclerosis. No enlarged abdominal or
pelvic lymph nodes.

Reproductive: Stable mild prostatic enlargement.

Other: No abdominal wall hernia or abnormality. No abdominopelvic
ascites.

Musculoskeletal: No acute or significant osseous findings.
IMPRESSION: 1. Focal diverticulitis is seen involving the proximal descending
colon without abscess formation.
2. Cholelithiasis.
3. Stable mild prostatic enlargement.
4. Aortic atherosclerosis.

Aortic Atherosclerosis ([id number]).

## 2021-02-27 DIAGNOSIS — E7800 Pure hypercholesterolemia, unspecified: Secondary | ICD-10-CM | POA: Diagnosis not present

## 2021-02-27 DIAGNOSIS — Z1159 Encounter for screening for other viral diseases: Secondary | ICD-10-CM | POA: Diagnosis not present

## 2021-02-27 DIAGNOSIS — Z1389 Encounter for screening for other disorder: Secondary | ICD-10-CM | POA: Diagnosis not present

## 2021-02-27 DIAGNOSIS — Z23 Encounter for immunization: Secondary | ICD-10-CM | POA: Diagnosis not present

## 2021-02-27 DIAGNOSIS — N401 Enlarged prostate with lower urinary tract symptoms: Secondary | ICD-10-CM | POA: Diagnosis not present

## 2021-02-27 DIAGNOSIS — I10 Essential (primary) hypertension: Secondary | ICD-10-CM | POA: Diagnosis not present

## 2021-02-27 DIAGNOSIS — K219 Gastro-esophageal reflux disease without esophagitis: Secondary | ICD-10-CM | POA: Diagnosis not present

## 2021-02-27 DIAGNOSIS — Z0000 Encounter for general adult medical examination without abnormal findings: Secondary | ICD-10-CM | POA: Diagnosis not present

## 2021-02-27 DIAGNOSIS — I4891 Unspecified atrial fibrillation: Secondary | ICD-10-CM | POA: Diagnosis not present

## 2021-02-27 DIAGNOSIS — Z125 Encounter for screening for malignant neoplasm of prostate: Secondary | ICD-10-CM | POA: Diagnosis not present

## 2021-03-25 ENCOUNTER — Other Ambulatory Visit: Payer: Self-pay

## 2021-03-25 ENCOUNTER — Encounter (HOSPITAL_COMMUNITY): Payer: Self-pay | Admitting: Nurse Practitioner

## 2021-03-25 ENCOUNTER — Ambulatory Visit (HOSPITAL_COMMUNITY)
Admission: RE | Admit: 2021-03-25 | Discharge: 2021-03-25 | Disposition: A | Discharge status: Home / Self Care | Payer: Medicare Other | Source: Ambulatory Visit | Attending: Nurse Practitioner | Admitting: Nurse Practitioner

## 2021-03-25 VITALS — BP 114/78 | HR 47 | Ht 72.0 in | Wt 397.4 lb

## 2021-03-25 DIAGNOSIS — I10 Essential (primary) hypertension: Secondary | ICD-10-CM | POA: Diagnosis not present

## 2021-03-25 DIAGNOSIS — Z7901 Long term (current) use of anticoagulants: Secondary | ICD-10-CM | POA: Insufficient documentation

## 2021-03-25 DIAGNOSIS — D6869 Other thrombophilia: Secondary | ICD-10-CM

## 2021-03-25 DIAGNOSIS — E785 Hyperlipidemia, unspecified: Secondary | ICD-10-CM | POA: Diagnosis not present

## 2021-03-25 DIAGNOSIS — Z6841 Body Mass Index (BMI) 40.0 and over, adult: Secondary | ICD-10-CM | POA: Diagnosis not present

## 2021-03-25 DIAGNOSIS — I4891 Unspecified atrial fibrillation: Secondary | ICD-10-CM | POA: Diagnosis present

## 2021-03-25 DIAGNOSIS — I480 Paroxysmal atrial fibrillation: Secondary | ICD-10-CM | POA: Diagnosis not present

## 2021-03-25 LAB — MAGNESIUM: Magnesium: 2.3 mg/dL (ref 1.7–2.4)

## 2021-03-25 LAB — BASIC METABOLIC PANEL
Anion gap: 6 (ref 5–15)
BUN: 14 mg/dL (ref 8–23)
CO2: 27 mmol/L (ref 22–32)
Calcium: 9.1 mg/dL (ref 8.9–10.3)
Chloride: 106 mmol/L (ref 98–111)
Creatinine, Ser: 0.98 mg/dL (ref 0.61–1.24)
GFR, Estimated: >60 mL/min (ref >60)
Glucose, Bld: 95 mg/dL (ref 70–99)
Potassium: 4.7 mmol/L (ref 3.5–5.1)
Sodium: 139 mmol/L (ref 135–145)

## 2021-03-25 LAB — TSH: TSH: 0.958 u[IU]/mL (ref 0.350–4.500)

## 2021-03-25 MED ORDER — METOPROLOL SUCCINATE ER 50 MG PO TB24
75.0000 mg | ORAL_TABLET | Freq: Every day | ORAL | 1 refills | Status: DC
Start: 2021-03-25 — End: 2021-12-15

## 2021-03-25 NOTE — Progress Notes (Addendum)
Primary Care Physician: [name], MD Referring Physician:Dr. [name], MD Cardiologist: Dr. Martyn Malay Christopher Hanson is a 70 y.o. male with a h/o HTN, BPH, sleep apnea, obesity,GERD, hyperlipidemia that was found to be in afib, rate at 103 and asymptomatic, at routine PCP visit  3/18. He was already on metoprolol and this dose was doubled yesterday. Today HR in the 60's in afib   He was also started on anticoagulation, Xarelto 20 mg daily, with a CHA2DS2VASc score of 2. Denies  bleeding history. He has had both covid shots, last one 1-2 weeks ago.   He drinks moderate caffeine, does not use tobacco, minimal alcohol. Wife states significant snoring with apnea. Has not had a sleep study in the past. He had labs drawn at PCP and heard last night labs were WNL. He has a BMI of 51.63, weight of 370 lbs.  He was going to gym regularly before covid, got a rowing machine afterward, hurt his back and is now not doing any regular exercise. He has gained back around 36 lbs.  F/u in afib clinic,4/2, he remains in rate controlled afib, asymptomatic. Fluid status is stable. He has not missed any anticoagulation since started 3/19. He feels well. Echo showed  EF of 55-65%, with moderate LVH and severely dilated Left atrium. He would be eligible for a cardioversion after 4/9, if no missed DOAC, but he would like to wait until he hears about the date set for sleep study with his appointment with Dr. [place] Caul set for 4/8.   He is now back in afib clinic, 5/12,  had positive  sleep study and has been on cpap for 2 nights. He wants to proceed with cardioversion. Has been on anticoagulation for over 2 weeks, no missed doses.   F/u in afib clinic 5/26. Unfortunately, DCCV was not successful.  It was not entirely surprising as pt has a severely dilated left atrium and his morbid  obesity is also contributing. He has been on cpap now x  2 weeks.   Afib clinic, 6/15, He is here for tikosyn admit. No benadryl  products, no missed anticoagulation. He was taken off  Triamterene/HCTZ on 6/12 , as it could enhance levels of tikosyn, and started on lisinopril 10 mg with controlled BP.   F/u in afib clinic, 6/25, after Tikosyn admit. He is in SR but does not feel any better. He has had some soft BP's, a tickle type cough and mild lightheadedness since starting lisinopril. This was started since triamterene/hctz was stopped for Tikosyn to start. Qtc  is acceptable .   F/u in afib clinic, 4/26, for Tikosyn f/u. He is staying in SR, but HR 's have been running  in the 40's. He is  feeling like his hands and left foot are swelling. He is eating meal plan from  semi prepared food, some have a lot of sodium in them. Dr. Ellyn Hack would prefer not for him to go on diuretic because of Tikosyn. He also drinks a lot of coffee during the day, half decaf in the afternoon, probably 2 pots a day.   Today, he denies symptoms of palpitations, chest pain, shortness of breath, orthopnea, PND, lower extremity edema, dizziness, presyncope, syncope, or neurologic sequela. The patient is tolerating medications without difficulties and is otherwise without complaint today.   Past Medical History:  Diagnosis Date  . GERD (gastroesophageal reflux disease)   . Hyperlipemia   . Hypertension   . Morbidly obese ([place])  BMI 50  . OSA on CPAP 03/2020   Diagnosed April-May 2021  . Paroxysmal atrial fibrillation ([place]) 01/2018   Persistent paroxysmal A. fib; s/p facilitated DCCV while on Tikosyn (May 16, 2020)  . Seasonal allergies   . Wears glasses    Past Surgical History:  Procedure Laterality Date  . CARDIOVERSION N/A 04/17/2020   Procedure: CARDIOVERSION;  Surgeon: [name], MD;  Location: [place];  Service: Cardiovascular;  Laterality: N/A;  . CARDIOVERSION N/A 05/16/2020   Procedure: CARDIOVERSION;  Surgeon: [name], MD;  Location: [place];  Service: Cardiovascular;  Laterality: N/A;  .  CARPAL TUNNEL RELEASE     right and left  . COLONOSCOPY    . CYST EXCISION     face  . EAR CYST EXCISION Left 02/11/2015   Procedure: EXCISION OF LEFT POST AURICULAR CYST;  Surgeon: [name], MD;  Location: [place];  Service: ENT;  Laterality: Left;  . INGUINAL HERNIA REPAIR     bilat-as infant  . NASAL SINUS SURGERY  2000    Current Outpatient Medications  Medication Sig Dispense Refill  . azelastine (ASTELIN) 0.1 % nasal spray as needed.    . calcium gluconate 500 MG tablet Take 1 tablet by mouth daily.    . cetirizine (ZYRTEC) 10 MG tablet Take 10 mg by mouth daily.     . Cholecalciferol (VITAMIN D3) 50 MCG (2000 UT) TABS Take 2,000 Units by mouth daily.    Christopher Kitchen dofetilide (TIKOSYN) 500 MCG capsule Take 1 capsule (500 mcg total) by mouth 2 (two) times daily. 60 capsule 9  . ezetimibe (ZETIA) 10 MG tablet Take 10 mg by mouth at bedtime.     . metoprolol succinate (TOPROL-XL) 100 MG 24 hr tablet Take 100 mg by mouth daily.    . Multiple Vitamins-Minerals (MENS MULTIVITAMIN) TABS daily.    Christopher Kitchen omeprazole (PRILOSEC) 20 MG capsule Take 20 mg by mouth daily before breakfast.     . potassium chloride (KLOR-CON) 10 MEQ tablet Take 1 tablet (10 mEq total) by mouth daily. 90 tablet 3  . rivaroxaban (XARELTO) 20 MG TABS tablet Take 1 tablet (20 mg total) by mouth daily with supper. 30 tablet 11  . simvastatin (ZOCOR) 40 MG tablet Take 40 mg by mouth at bedtime.    . tamsulosin (FLOMAX) 0.4 MG CAPS capsule Take 0.4 mg by mouth in the morning and at bedtime.     . valsartan (DIOVAN) 80 MG tablet Take 1 tablet (80 mg total) by mouth daily. 90 tablet 3   No current facility-administered medications for this encounter.    Allergies  Allergen Reactions  . Lisinopril Cough    Social History   Socioeconomic History  . Marital status: Married    Spouse name: Not on file  . Number of children: Not on file  . Years of education: Not on file  . Highest education level: Not on  file  Occupational History  . Not on file  Tobacco Use  . Smoking status: Never Smoker  . Smokeless tobacco: Never Used  Vaping Use  . Vaping Use: Never used  Substance and Sexual Activity  . Alcohol use: Yes    Alcohol/week: 2.0 standard drinks    Types: 1 Glasses of wine, 1 Shots of liquor per week    Comment: occ  . Drug use: No  . Sexual activity: Not on file  Other Topics Concern  . Not on file  Social History Narrative  .  Not on file   Social Determinants of Health   Financial Resource Strain: Not on file  Food Insecurity: Not on file  Transportation Needs: Not on file  Physical Activity: Not on file  Stress: Not on file  Social Connections: Not on file  Intimate Partner Violence: Not on file    No family history on file.  ROS- All systems are reviewed and negative except as per the HPI above  Physical Exam: Vitals:   03/25/21 1007  BP: 114/78  Pulse: (!) 47  Weight: (!) 180.3 kg  Height: 6' (1.829 m)   Wt Readings from Last 3 Encounters:  03/25/21 (!) 180.3 kg  01/10/21 (!) 175.5 kg  10/08/20 (!) 175.1 kg    Labs: Lab Results  Component Value Date   NA 138 10/08/2020   K 4.1 10/08/2020   CL 103 10/08/2020   CO2 26 10/08/2020   GLUCOSE 96 10/08/2020   BUN 14 10/08/2020   CREATININE 1.02 10/08/2020   CALCIUM 9.1 10/08/2020   MG 2.0 09/24/2020   Lab Results  Component Value Date   INR 1.4 (H) 05/16/2020   No results found for: CHOL, HDL, LDLCALC, TRIG   GEN- The patient is well appearing, alert and oriented x 3 today.   Head- normocephalic, atraumatic Eyes-  Sclera clear, conjunctiva pink Ears- hearing intact Oropharynx- clear Neck- supple, no JVP Lymph- no cervical lymphadenopathy Lungs- Clear to ausculation bilaterally, normal work of breathing Heart- slow  regular rate and rhythm, no murmurs, rubs or gallops, PMI not laterally displaced GI- soft, NT, ND, + BS Extremities- no clubbing, cyanosis, or edema MS- no significant  deformity or atrophy Skin- no rash or lesion Psych- euthymic mood, full affect Neuro- strength and sensation are intact  EKG- Sinus  brady at 47 bpm, PR int 188 ms, qrs int 96 ms, qtc 459 ms  Echo-1. Left ventricular ejection fraction, by estimation, is 55 to 60%. The  left ventricle has normal function. The left ventricle has no regional  wall motion abnormalities. There is moderate left ventricular hypertrophy.  Left ventricular diastolic  parameters are indeterminate.  2. Right ventricular systolic function is normal. The right ventricular  size is normal. There is mildly elevated pulmonary artery systolic  pressure.  3. Left atrial size was severely dilated.  4. Right atrial size was moderately dilated.  5. The mitral valve is normal in structure. Trivial mitral valve  regurgitation. No evidence of mitral stenosis.  6. The aortic valve is tricuspid. Aortic valve regurgitation is not  visualized. No aortic stenosis is present.  7. The inferior vena cava is dilated in size with >50% respiratory  variability, suggesting right atrial pressure of 8 mmHg.    Assessment and Plan: 1. New onset afib S/p Tikosyn loading 04/2020 and is staying in SR  Rate is slow in the 40's  Will decrease metoprolol to 75 mg daily  Continue tikosyn  500 mcg bid  qtc is stable  General Tikosyn precautions reviewed Bmet/mag today stable to continue Tikosyn    2.CHA2DS2VASc score of 2( age/htn) Continue  on  xarelto 20 mg daily     3.Lifstyle issues/triggers Has had some weight gain from 175 kg to 180 kg  Encouraged to reduce caffeine intake from 2 pots a day Regular exercise and weight loss encouraged Avoid salt and elevate legs, linmit fluids to no more than 2 liters today   Continues on cpap Will check TSH today with slow HR and fluid retention   F/u  with ekg 2 weeks     Geroge Baseman. Luverna Degenhart, [facility] [address] [place], [place]  [phone]

## 2021-03-25 NOTE — Patient Instructions (Addendum)
Decrease metoprolol to 50 mg 1 and 1/2 tab daily for a total of 75 mg  Return in 2 weeks for ekg

## 2021-04-02 DIAGNOSIS — H524 Presbyopia: Secondary | ICD-10-CM | POA: Diagnosis not present

## 2021-04-02 DIAGNOSIS — H35033 Hypertensive retinopathy, bilateral: Secondary | ICD-10-CM | POA: Diagnosis not present

## 2021-04-02 DIAGNOSIS — H40053 Ocular hypertension, bilateral: Secondary | ICD-10-CM | POA: Diagnosis not present

## 2021-04-02 DIAGNOSIS — H2513 Age-related nuclear cataract, bilateral: Secondary | ICD-10-CM | POA: Diagnosis not present

## 2021-04-08 ENCOUNTER — Ambulatory Visit (HOSPITAL_COMMUNITY)
Admission: RE | Admit: 2021-04-08 | Discharge: 2021-04-08 | Disposition: A | Discharge status: Home / Self Care | Payer: Medicare Other | Source: Ambulatory Visit | Attending: Nurse Practitioner | Admitting: Nurse Practitioner

## 2021-04-08 ENCOUNTER — Other Ambulatory Visit: Payer: Self-pay

## 2021-04-08 DIAGNOSIS — R001 Bradycardia, unspecified: Secondary | ICD-10-CM | POA: Insufficient documentation

## 2021-04-08 DIAGNOSIS — Z79899 Other long term (current) drug therapy: Secondary | ICD-10-CM | POA: Diagnosis not present

## 2021-04-08 DIAGNOSIS — I480 Paroxysmal atrial fibrillation: Secondary | ICD-10-CM

## 2021-04-08 NOTE — Progress Notes (Signed)
Pt in for EKG, with bradycardia in the 40's noted at last visit. His EKG shows today sinus brady at 58 bpm, pr int 188 ms, qrs int 100 ms, qtc 445 ms . He will continue on the lower dose of BB at 75 mg daily. F/u as scheduled with Dr. Rayann Heman,  in October. [name]

## 2021-05-27 ENCOUNTER — Other Ambulatory Visit (HOSPITAL_COMMUNITY): Payer: Self-pay | Admitting: Nurse Practitioner

## 2021-06-25 DIAGNOSIS — I10 Essential (primary) hypertension: Secondary | ICD-10-CM | POA: Diagnosis not present

## 2021-06-25 DIAGNOSIS — E7800 Pure hypercholesterolemia, unspecified: Secondary | ICD-10-CM | POA: Diagnosis not present

## 2021-06-25 DIAGNOSIS — K219 Gastro-esophageal reflux disease without esophagitis: Secondary | ICD-10-CM | POA: Diagnosis not present

## 2021-06-25 DIAGNOSIS — I4891 Unspecified atrial fibrillation: Secondary | ICD-10-CM | POA: Diagnosis not present

## 2021-06-25 DIAGNOSIS — N400 Enlarged prostate without lower urinary tract symptoms: Secondary | ICD-10-CM | POA: Diagnosis not present

## 2021-06-25 DIAGNOSIS — N401 Enlarged prostate with lower urinary tract symptoms: Secondary | ICD-10-CM | POA: Diagnosis not present

## 2021-08-06 DIAGNOSIS — G4733 Obstructive sleep apnea (adult) (pediatric): Secondary | ICD-10-CM | POA: Diagnosis not present

## 2021-08-18 ENCOUNTER — Telehealth: Payer: Self-pay | Admitting: Cardiology

## 2021-08-18 MED ORDER — DOFETILIDE 500 MCG PO CAPS
500.0000 ug | ORAL_CAPSULE | Freq: Two times a day (BID) | ORAL | 9 refills | Status: DC
Start: 2021-08-18 — End: 2022-06-10

## 2021-08-18 NOTE — Telephone Encounter (Signed)
*  STAT* If patient is at the pharmacy, call can be transferred to refill team.   1. Which medications need to be refilled? (please list name of each medication and dose if known) dofetilide (TIKOSYN) 500 MCG capsule  2. Which pharmacy/location (including street and city if local pharmacy) is medication to be sent to? [facility] [address] [place], [place]. AT [place]  3. Do they need a 30 day or 90 day supply? 90   Patient switch too a new pharmacy where it is cheaper for medication

## 2021-08-21 ENCOUNTER — Encounter: Payer: Self-pay | Admitting: Podiatry

## 2021-08-21 ENCOUNTER — Other Ambulatory Visit: Payer: Self-pay

## 2021-08-21 ENCOUNTER — Ambulatory Visit (INDEPENDENT_AMBULATORY_CARE_PROVIDER_SITE_OTHER): Payer: Medicare Other | Admitting: Podiatry

## 2021-08-21 DIAGNOSIS — M79675 Pain in left toe(s): Secondary | ICD-10-CM | POA: Diagnosis not present

## 2021-08-21 DIAGNOSIS — L309 Dermatitis, unspecified: Secondary | ICD-10-CM

## 2021-08-21 DIAGNOSIS — B351 Tinea unguium: Secondary | ICD-10-CM

## 2021-08-21 DIAGNOSIS — M79674 Pain in right toe(s): Secondary | ICD-10-CM | POA: Diagnosis not present

## 2021-08-21 DIAGNOSIS — D689 Coagulation defect, unspecified: Secondary | ICD-10-CM | POA: Diagnosis not present

## 2021-08-21 NOTE — Progress Notes (Signed)
Subjective:   Patient ID: Christopher Hanson, male   DOB: 70 y.o.   MRN: [id number]   HPI Patient presents stating he has had significant nail disease over the last 5 to 6 years and he does have significant obesity which makes it impossible for him to take care of his nails.  Also has dry skin on both feet which become aggravating and crack around his heels and patient does not smoke and needs to be more active   Review of Systems  All other systems reviewed and are negative.      Objective:  Physical Exam Vitals and nursing note reviewed.  Constitutional:      Appearance: He is well-developed.  Pulmonary:     Effort: Pulmonary effort is normal.  Musculoskeletal:        General: Normal range of motion.  Skin:    General: Skin is warm.  Neurological:     Mental Status: He is alert.    Vascular status mildly diminished PT DP pulses but intact with sharp dull vibratory intact.  Muscle strength was found to be adequate range of motion mildly restricted subtalar joint with patient noted to have thick yellow brittle nailbeds both feet with incurvation of the corners and possibility for him to cut but appears to be more due to just skin condition with dry heels cracks but no open areas.  Marked obesity that has gotten worse recently     Assessment:  Mycotic nail infection dermatitis dry skin condition obesity     Plan:  H&P reviewed all conditions with and encouraged him to lose rate and gave him several options for this.  I went ahead and I debrided nailbeds 1-5 today no iatrogenic bleeding will be seen back for routine care or earlier if needed

## 2021-08-27 DIAGNOSIS — N400 Enlarged prostate without lower urinary tract symptoms: Secondary | ICD-10-CM | POA: Diagnosis not present

## 2021-08-27 DIAGNOSIS — N401 Enlarged prostate with lower urinary tract symptoms: Secondary | ICD-10-CM | POA: Diagnosis not present

## 2021-08-27 DIAGNOSIS — K219 Gastro-esophageal reflux disease without esophagitis: Secondary | ICD-10-CM | POA: Diagnosis not present

## 2021-08-27 DIAGNOSIS — E7800 Pure hypercholesterolemia, unspecified: Secondary | ICD-10-CM | POA: Diagnosis not present

## 2021-08-27 DIAGNOSIS — I10 Essential (primary) hypertension: Secondary | ICD-10-CM | POA: Diagnosis not present

## 2021-08-27 DIAGNOSIS — I480 Paroxysmal atrial fibrillation: Secondary | ICD-10-CM | POA: Diagnosis not present

## 2021-08-28 DIAGNOSIS — Z23 Encounter for immunization: Secondary | ICD-10-CM | POA: Diagnosis not present

## 2021-08-28 DIAGNOSIS — K219 Gastro-esophageal reflux disease without esophagitis: Secondary | ICD-10-CM | POA: Diagnosis not present

## 2021-08-28 DIAGNOSIS — I480 Paroxysmal atrial fibrillation: Secondary | ICD-10-CM | POA: Diagnosis not present

## 2021-08-28 DIAGNOSIS — G4733 Obstructive sleep apnea (adult) (pediatric): Secondary | ICD-10-CM | POA: Diagnosis not present

## 2021-08-28 DIAGNOSIS — I10 Essential (primary) hypertension: Secondary | ICD-10-CM | POA: Diagnosis not present

## 2021-08-28 DIAGNOSIS — E7800 Pure hypercholesterolemia, unspecified: Secondary | ICD-10-CM | POA: Diagnosis not present

## 2021-09-11 DIAGNOSIS — Z20822 Contact with and (suspected) exposure to covid-19: Secondary | ICD-10-CM | POA: Diagnosis not present

## 2021-09-12 DIAGNOSIS — U071 COVID-19: Secondary | ICD-10-CM | POA: Diagnosis not present

## 2021-09-16 DIAGNOSIS — Z20822 Contact with and (suspected) exposure to covid-19: Secondary | ICD-10-CM | POA: Diagnosis not present

## 2021-09-24 ENCOUNTER — Ambulatory Visit: Payer: Medicare Other | Admitting: Student

## 2021-10-06 ENCOUNTER — Ambulatory Visit (INDEPENDENT_AMBULATORY_CARE_PROVIDER_SITE_OTHER): Payer: Medicare Other | Admitting: Student

## 2021-10-06 ENCOUNTER — Other Ambulatory Visit: Payer: Self-pay

## 2021-10-06 ENCOUNTER — Encounter: Payer: Self-pay | Admitting: Student

## 2021-10-06 VITALS — BP 134/90 | HR 61 | Ht 72.0 in | Wt 398.0 lb

## 2021-10-06 DIAGNOSIS — I10 Essential (primary) hypertension: Secondary | ICD-10-CM

## 2021-10-06 DIAGNOSIS — I480 Paroxysmal atrial fibrillation: Secondary | ICD-10-CM

## 2021-10-06 DIAGNOSIS — D6869 Other thrombophilia: Secondary | ICD-10-CM

## 2021-10-06 DIAGNOSIS — Z6841 Body Mass Index (BMI) 40.0 and over, adult: Secondary | ICD-10-CM | POA: Diagnosis not present

## 2021-10-06 LAB — BASIC METABOLIC PANEL
BUN/Creatinine Ratio: 20 (ref 10–24)
BUN: 20 mg/dL (ref 8–27)
CO2: 23 mmol/L (ref 20–29)
Calcium: 9.4 mg/dL (ref 8.6–10.2)
Chloride: 107 mmol/L — ABNORMAL HIGH (ref 96–106)
Creatinine, Ser: 1.02 mg/dL (ref 0.76–1.27)
Glucose: 89 mg/dL (ref 70–99)
Potassium: 4.3 mmol/L (ref 3.5–5.2)
Sodium: 142 mmol/L (ref 134–144)
eGFR: 80 mL/min/{1.73_m2} (ref >59)

## 2021-10-06 LAB — MAGNESIUM: Magnesium: 2 mg/dL (ref 1.6–2.3)

## 2021-10-06 NOTE — Progress Notes (Signed)
PCP:  [name], MD Primary Cardiologist: [name], MD Electrophysiologist: [name], MD   Christopher Hanson is a 70 y.o. male seen today for [name], MD for routine electrophysiology followup.  Since last being seen in our clinic the patient reports doing well overall. His wife has had several serious health issues recently and he has has an understandable amount of stress associated with that. He has noticed a "feeling" in his chest. He rates it 0.5(one half)/10, "just enough that I notice it". No clear aggravating or relieving factors. No exertional component. Able to walk the steep hill to his mailbox or stairs without bringing it on. He is also a candidate for a weight loss drug study and wants to ask about potential interactions.   Past Medical History:  Diagnosis Date   GERD (gastroesophageal reflux disease)    Hyperlipemia    Hypertension    Morbidly obese ([facility])    BMI 50   OSA on CPAP 03/2020   Diagnosed April-May 2021   Paroxysmal atrial fibrillation ([place]) 01/2018   Persistent paroxysmal A. fib; s/p facilitated DCCV while on Tikosyn (May 16, 2020)   Seasonal allergies    Wears glasses    Past Surgical History:  Procedure Laterality Date   CARDIOVERSION N/A 04/17/2020   Procedure: CARDIOVERSION;  Surgeon: [name], MD;  Location: [place];  Service: Cardiovascular;  Laterality: N/A;   CARDIOVERSION N/A 05/16/2020   Procedure: CARDIOVERSION;  Surgeon: [name], MD;  Location: [place];  Service: Cardiovascular;  Laterality: N/A;   CARPAL TUNNEL RELEASE     right and left   COLONOSCOPY     CYST EXCISION     face   EAR CYST EXCISION Left 02/11/2015   Procedure: EXCISION OF LEFT POST AURICULAR CYST;  Surgeon: [name], MD;  Location: [facility];  Service: ENT;  Laterality: Left;   INGUINAL HERNIA REPAIR     bilat-as infant   NASAL SINUS SURGERY  2000    Current Outpatient Medications  Medication Sig  Dispense Refill   azelastine (ASTELIN) 0.1 % nasal spray as needed.     calcium gluconate 500 MG tablet Take 1 tablet by mouth daily.     cetirizine (ZYRTEC) 10 MG tablet Take 10 mg by mouth daily.      Cholecalciferol (VITAMIN D3) 50 MCG (2000 UT) TABS Take 2,000 Units by mouth daily.     dofetilide (TIKOSYN) 500 MCG capsule Take 1 capsule (500 mcg total) by mouth 2 (two) times daily. 60 capsule 9   ezetimibe (ZETIA) 10 MG tablet Take 10 mg by mouth at bedtime.      metoprolol succinate (TOPROL-XL) 50 MG 24 hr tablet Take 1.5 tablets (75 mg total) by mouth daily. 45 tablet 1   Multiple Vitamins-Minerals (MENS MULTIVITAMIN) TABS daily.     omeprazole (PRILOSEC) 20 MG capsule Take 20 mg by mouth daily before breakfast.      potassium chloride (KLOR-CON) 10 MEQ tablet Take 1 tablet (10 mEq total) by mouth daily. 90 tablet 3   simvastatin (ZOCOR) 40 MG tablet Take 40 mg by mouth at bedtime.     tamsulosin (FLOMAX) 0.4 MG CAPS capsule Take 0.4 mg by mouth in the morning and at bedtime.      valsartan (DIOVAN) 80 MG tablet Take 1 tablet (80 mg total) by mouth daily. 90 tablet 3   XARELTO 20 MG TABS tablet TAKE 1 TABLET(20 MG) BY MOUTH DAILY WITH SUPPER 30 tablet 11  No current facility-administered medications for this visit.    Allergies  Allergen Reactions   Lisinopril Cough    Social History   Socioeconomic History   Marital status: Married    Spouse name: Not on file   Number of children: Not on file   Years of education: Not on file   Highest education level: Not on file  Occupational History   Not on file  Tobacco Use   Smoking status: Never   Smokeless tobacco: Never  Vaping Use   Vaping Use: Never used  Substance and Sexual Activity   Alcohol use: Yes    Alcohol/week: 2.0 standard drinks    Types: 1 Glasses of wine, 1 Shots of liquor per week    Comment: occ   Drug use: No   Sexual activity: Not on file  Other Topics Concern   Not on file  Social History Narrative    Not on file   Social Determinants of Health   Financial Resource Strain: Not on file  Food Insecurity: Not on file  Transportation Needs: Not on file  Physical Activity: Not on file  Stress: Not on file  Social Connections: Not on file  Intimate Partner Violence: Not on file     Review of Systems: All other systems reviewed and are otherwise negative except as noted above.  Physical Exam: Vitals:   10/06/21 1120  BP: 134/90  Pulse: 61  SpO2: 97%  Weight: (!) 398 lb (180.5 kg)  Height: 6' (1.829 m)    GEN- The patient is well appearing, alert and oriented x 3 today.   HEENT: normocephalic, atraumatic; sclera clear, conjunctiva pink; hearing intact; oropharynx clear; neck supple, no JVP Lymph- no cervical lymphadenopathy Lungs- Clear to ausculation bilaterally, normal work of breathing.  No wheezes, rales, rhonchi Heart- Regular rate and rhythm, no murmurs, rubs or gallops, PMI not laterally displaced GI- soft, non-tender, non-distended, bowel sounds present, no hepatosplenomegaly Extremities- no clubbing, cyanosis, or edema; DP/PT/radial pulses 2+ bilaterally MS- no significant deformity or atrophy Skin- warm and dry, no rash or lesion Psych- euthymic mood, full affect Neuro- strength and sensation are intact  EKG is ordered. Personal review of EKG from today shows Sinus brady/ NSR at 59   Additional studies reviewed include: Previous EP office notes.   Assessment and Plan:  1. Persistent atrial fibrillation Continue Xarelto for CHA2DS2VASC of at least 2   EKG today shows NSR as above  Continue tikosyn 500 mcg BID. Stable QTc Labs today.    2. HTN Stable on current regimen    3. Obesity Body mass index is 53.98 kg/m.  Encouraged weight loss through diet and lifestyle modifications. He is seeing PCP for possible study drug consisting of semaglutide + cagrilintide. Will ask Pharmacy to review meds for any potential interactions.   4. Chest pain,  non-cardiac More consistent with his h/o GERD.  He is about to start an exercise program. I asked him to let us know if he has any changes in severity, frequency, or what brings on his discomfort.    Follow up with AF clinic 6 months for continued Tikosyn f/u. Labs today.   Shirley Friar, PA-C  10/06/21 11:29 AM

## 2021-10-06 NOTE — Patient Instructions (Signed)
Medication Instructions:  Your physician recommends that you continue on your current medications as directed. Please refer to the Current Medication list given to you today.  *If you need a refill on your cardiac medications before your next appointment, please call your pharmacy*   Lab Work: TODAY: BMET, Mg  If you have labs (blood work) drawn today and your tests are completely normal, you will receive your results only by: [place] (if you have MyChart) OR A paper copy in the mail If you have any lab test that is abnormal or we need to change your treatment, we will call you to review the results.   Follow-Up: At [facility], you and your health needs are our priority.  As part of our continuing mission to provide you with exceptional heart care, we have created designated Provider Care Teams.  These Care Teams include your primary Cardiologist (physician) and Advanced Practice Providers (APPs -  Physician Assistants and Nurse Practitioners) who all work together to provide you with the care you need, when you need it.   Your next appointment:   6 month(s)  The format for your next appointment:   In Person  Provider:   You will follow up in the [facility] located at [facility]. Your provider will be:  Clint R. Fenton, PA-C

## 2021-10-31 DIAGNOSIS — N401 Enlarged prostate with lower urinary tract symptoms: Secondary | ICD-10-CM | POA: Diagnosis not present

## 2021-10-31 DIAGNOSIS — L821 Other seborrheic keratosis: Secondary | ICD-10-CM | POA: Diagnosis not present

## 2021-10-31 DIAGNOSIS — N5201 Erectile dysfunction due to arterial insufficiency: Secondary | ICD-10-CM | POA: Diagnosis not present

## 2021-10-31 DIAGNOSIS — Z125 Encounter for screening for malignant neoplasm of prostate: Secondary | ICD-10-CM | POA: Diagnosis not present

## 2021-10-31 DIAGNOSIS — L570 Actinic keratosis: Secondary | ICD-10-CM | POA: Diagnosis not present

## 2021-10-31 DIAGNOSIS — Z23 Encounter for immunization: Secondary | ICD-10-CM | POA: Diagnosis not present

## 2021-10-31 DIAGNOSIS — R3912 Poor urinary stream: Secondary | ICD-10-CM | POA: Diagnosis not present

## 2021-11-17 ENCOUNTER — Encounter (HOSPITAL_COMMUNITY): Payer: Self-pay

## 2021-11-17 ENCOUNTER — Telehealth (HOSPITAL_COMMUNITY): Payer: Self-pay | Admitting: Nurse Practitioner

## 2021-11-17 NOTE — Telephone Encounter (Signed)
Pt called in to clinic with being in afib this am. BP's running 153/99 with heart rate of 100 bpm. He is being compliant with tikosyn and anticoagulation. She will try adding 25 mg Toprol pm dose and call in am to report HR's/Bp's.

## 2021-11-20 ENCOUNTER — Ambulatory Visit (HOSPITAL_COMMUNITY): Payer: Medicare Other | Admitting: Nurse Practitioner

## 2021-11-21 ENCOUNTER — Encounter: Payer: Self-pay | Admitting: Podiatry

## 2021-11-21 ENCOUNTER — Ambulatory Visit (INDEPENDENT_AMBULATORY_CARE_PROVIDER_SITE_OTHER): Payer: Medicare Other | Admitting: Podiatry

## 2021-11-21 ENCOUNTER — Other Ambulatory Visit: Payer: Self-pay

## 2021-11-21 DIAGNOSIS — B351 Tinea unguium: Secondary | ICD-10-CM | POA: Diagnosis not present

## 2021-11-21 DIAGNOSIS — R234 Changes in skin texture: Secondary | ICD-10-CM | POA: Diagnosis not present

## 2021-11-21 DIAGNOSIS — R600 Localized edema: Secondary | ICD-10-CM

## 2021-11-21 DIAGNOSIS — M79675 Pain in left toe(s): Secondary | ICD-10-CM | POA: Diagnosis not present

## 2021-11-21 DIAGNOSIS — M79674 Pain in right toe(s): Secondary | ICD-10-CM

## 2021-11-21 DIAGNOSIS — D689 Coagulation defect, unspecified: Secondary | ICD-10-CM

## 2021-11-21 NOTE — Progress Notes (Signed)
This patient returns to my office for at risk foot care.  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  This patient is unable to cut nails himself since the patient cannot reach his nails.These nails are painful walking and wearing shoes. Patient also has fissures on both heels due to his callus. This patient presents for at risk foot care today.  General Appearance  Alert, conversant and in no acute stress.  Vascular  Dorsalis pedis and posterior tibial  pulses are weakly  palpable  bilaterally.  Capillary return is within normal limits  bilaterally. Temperature is within normal limits  bilaterally.  Neurologic  Senn-Weinstein monofilament wire test within normal limits  bilaterally. Muscle power within normal limits bilaterally.  Nails Thick disfigured discolored nails with subungual debris  from hallux to fifth toes bilaterally. No evidence of bacterial infection or drainage bilaterally.  Orthopedic  No limitations of motion  feet .  No crepitus or effusions noted.  No bony pathology or digital deformities noted.  Skin  normotropic skin with no porokeratosis noted bilaterally.  No signs of infections or ulcers noted.   Fissures both heels.  Onychomycosis  Pain in right toes  Pain in left toes  Fissures/callus  B/L.  Consent was obtained for treatment procedures.   Mechanical debridement of nails 1-5  bilaterally performed with a nail nipper.  Filed with dremel without incident. Told him to use vaseline or O'Keffes moisturizer.  Dremel tool used to smooth callus heels  B/L   Return office visit                     Told patient to return for periodic foot care and evaluation due to potential at risk complications.   Gardiner Barefoot DPM

## 2021-12-14 ENCOUNTER — Other Ambulatory Visit (HOSPITAL_COMMUNITY): Payer: Self-pay | Admitting: Nurse Practitioner

## 2021-12-15 ENCOUNTER — Telehealth (HOSPITAL_COMMUNITY): Payer: Self-pay

## 2021-12-15 NOTE — Telephone Encounter (Signed)
Contacted patient regarding Metoprolol 75mg  medication. Pharmacy requested refills on this medication. Patient states he is doing much better. No A-fib since 11/19/21. His blood pressure is ranging around 138-141 range with diastolic pressure ranging from 65-78 along with his heart rate ranging from 55-65. He is scheduled to come back for follow up appointment in May. Consulted with patient and he verbalized understanding. Metoprolol medication 75mg , quantity 135 tablets sent into his pharmacy.

## 2021-12-23 ENCOUNTER — Other Ambulatory Visit: Payer: Self-pay | Admitting: Cardiology

## 2022-01-17 DIAGNOSIS — Z23 Encounter for immunization: Secondary | ICD-10-CM | POA: Diagnosis not present

## 2022-01-27 ENCOUNTER — Emergency Department (HOSPITAL_BASED_OUTPATIENT_CLINIC_OR_DEPARTMENT_OTHER): Payer: Medicare Other | Admitting: Radiology

## 2022-01-27 ENCOUNTER — Emergency Department (HOSPITAL_BASED_OUTPATIENT_CLINIC_OR_DEPARTMENT_OTHER)
Admission: EM | Admit: 2022-01-27 | Discharge: 2022-01-27 | Disposition: A | Discharge status: Home / Self Care | Payer: Medicare Other | Attending: Emergency Medicine | Admitting: Emergency Medicine

## 2022-01-27 ENCOUNTER — Other Ambulatory Visit: Payer: Self-pay

## 2022-01-27 ENCOUNTER — Encounter (HOSPITAL_BASED_OUTPATIENT_CLINIC_OR_DEPARTMENT_OTHER): Payer: Self-pay | Admitting: Urology

## 2022-01-27 DIAGNOSIS — R000 Tachycardia, unspecified: Secondary | ICD-10-CM | POA: Diagnosis not present

## 2022-01-27 DIAGNOSIS — Z79899 Other long term (current) drug therapy: Secondary | ICD-10-CM | POA: Insufficient documentation

## 2022-01-27 DIAGNOSIS — R079 Chest pain, unspecified: Secondary | ICD-10-CM

## 2022-01-27 DIAGNOSIS — R0789 Other chest pain: Secondary | ICD-10-CM | POA: Diagnosis not present

## 2022-01-27 DIAGNOSIS — R9431 Abnormal electrocardiogram [ECG] [EKG]: Secondary | ICD-10-CM | POA: Diagnosis not present

## 2022-01-27 LAB — URINALYSIS, ROUTINE W REFLEX MICROSCOPIC
Bilirubin Urine: NEGATIVE
Glucose, UA: NEGATIVE mg/dL
Hgb urine dipstick: NEGATIVE
Ketones, ur: NEGATIVE mg/dL
Leukocytes,Ua: NEGATIVE
Nitrite: NEGATIVE
Specific Gravity, Urine: 1.028 (ref 1.005–1.030)
pH: 5.5 (ref 5.0–8.0)

## 2022-01-27 LAB — CBC
HCT: 37.7 % — ABNORMAL LOW (ref 39.0–52.0)
Hemoglobin: 12.6 g/dL — ABNORMAL LOW (ref 13.0–17.0)
MCH: 29.2 pg (ref 26.0–34.0)
MCHC: 33.4 g/dL (ref 30.0–36.0)
MCV: 87.5 fL (ref 80.0–100.0)
Platelets: 165 10*3/uL (ref 150–400)
RBC: 4.31 MIL/uL (ref 4.22–5.81)
RDW: 13.5 % (ref 11.5–15.5)
WBC: 9.3 10*3/uL (ref 4.0–10.5)
nRBC: 0 % (ref 0.0–0.2)

## 2022-01-27 LAB — BASIC METABOLIC PANEL
Anion gap: 10 (ref 5–15)
BUN: 18 mg/dL (ref 8–23)
CO2: 23 mmol/L (ref 22–32)
Calcium: 9.2 mg/dL (ref 8.9–10.3)
Chloride: 103 mmol/L (ref 98–111)
Creatinine, Ser: 0.94 mg/dL (ref 0.61–1.24)
GFR, Estimated: >60 mL/min (ref >60)
Glucose, Bld: 118 mg/dL — ABNORMAL HIGH (ref 70–99)
Potassium: 3.8 mmol/L (ref 3.5–5.1)
Sodium: 136 mmol/L (ref 135–145)

## 2022-01-27 LAB — TROPONIN I (HIGH SENSITIVITY)
Troponin I (High Sensitivity): 5 ng/L (ref <18)
Troponin I (High Sensitivity): 5 ng/L (ref <18)

## 2022-01-27 LAB — D-DIMER, QUANTITATIVE: D-Dimer, Quant: <0.27 ug/mL-FEU (ref 0.00–0.50)

## 2022-01-27 NOTE — Discharge Instructions (Signed)
Please follow up with your doctor this week for your visit to the ER.

## 2022-01-27 NOTE — ED Provider Notes (Signed)
[facility] DEPT Provider Note   CSN: [id number] Arrival date & time: 01/27/22  1221     History  Chief Complaint  Patient presents with   Chest Pain    Christopher Hanson is a 71 y.o. male present emergency department for cute onset of left-sided chest pain.  Patient reports this began while he was in the office this morning, had severe stabbing pain that was sudden onset in his left lower lateral chest wall.  It lasted a minute and then he had a clicking feeling for a few minutes.  Then it went away.  Subsequently came back an hour later and is since gone away.  He is asymptomatic now.  Is never felt this before.  He denies shortness of breath.  He says he feels "shaky all over".  He denies dysuria but does have a history of kidney stones.  He denies history of DVT or PE or recent surgery.  He is on Xarelto and Tikosyn for A-fib, reports he has been in a sinus rhythm for a few months.  HPI     Home Medications Prior to Admission medications   Medication Sig Start Date End Date Taking? Authorizing Provider  azelastine (ASTELIN) 0.1 % nasal spray as needed. 06/05/20   [provider]  calcium gluconate 500 MG tablet Take 1 tablet by mouth daily.    [provider]  cetirizine (ZYRTEC) 10 MG tablet Take 10 mg by mouth daily.     [provider]  Cholecalciferol (VITAMIN D3) 50 MCG (2000 UT) TABS Take 2,000 Units by mouth daily.    [provider]  dofetilide (TIKOSYN) 500 MCG capsule Take 1 capsule (500 mcg total) by mouth 2 (two) times daily. 08/18/21   Leonie Man, MD  ezetimibe (ZETIA) 10 MG tablet Take 10 mg by mouth at bedtime.  12/07/19   [provider]  metoprolol succinate (TOPROL-XL) 50 MG 24 hr tablet TAKE 1 AND 1/2 TABLETS(75 MG) BY MOUTH DAILY 12/15/21   Sherran Needs, NP  Multiple Vitamins-Minerals (MENS MULTIVITAMIN) TABS daily.    [provider]  omeprazole (PRILOSEC) 20 MG capsule Take 20 mg  by mouth daily before breakfast.     [provider]  potassium chloride (KLOR-CON) 10 MEQ tablet Take 1 tablet (10 mEq total) by mouth daily. 02/11/21   Leonie Man, MD  simvastatin (ZOCOR) 40 MG tablet Take 40 mg by mouth at bedtime. 12/07/19   [provider]  tamsulosin (FLOMAX) 0.4 MG CAPS capsule Take 0.4 mg by mouth in the morning and at bedtime.  12/07/19   [provider]  valsartan (DIOVAN) 80 MG tablet TAKE 1 TABLET(80 MG) BY MOUTH DAILY 12/23/21   Leonie Man, MD  XARELTO 20 MG TABS tablet TAKE 1 TABLET(20 MG) BY MOUTH DAILY WITH SUPPER 05/27/21   Sherran Needs, NP      Allergies    Lisinopril    Review of Systems   Review of Systems  Physical Exam Updated Vital Signs BP (!) 162/70    Pulse (!) 59    Temp 99.7 F (37.6 C) (Oral)    Resp 19    Ht 6' (1.829 m)    Wt (!) 171.5 kg    SpO2 100%    BMI 51.27 kg/m  Physical Exam Constitutional:      General: He is not in acute distress.    Appearance: He is obese.  HENT:     Head: Normocephalic and  atraumatic.  Eyes:     Conjunctiva/sclera: Conjunctivae normal.     Pupils: Pupils are equal, round, and reactive to light.  Cardiovascular:     Rate and Rhythm: Regular rhythm. Tachycardia present.  Pulmonary:     Effort: Pulmonary effort is normal. No respiratory distress.  Abdominal:     General: There is no distension.     Tenderness: There is no abdominal tenderness.  Skin:    General: Skin is warm and dry.  Neurological:     General: No focal deficit present.     Mental Status: He is alert. Mental status is at baseline.  Psychiatric:        Mood and Affect: Mood normal.        Behavior: Behavior normal.    ED Results / Procedures / Treatments   Labs (all labs ordered are listed, but only abnormal results are displayed) Labs Reviewed  BASIC METABOLIC PANEL - Abnormal; Notable for the following components:      Result Value   Glucose, Bld 118 (*)    All other components within  normal limits  CBC - Abnormal; Notable for the following components:   Hemoglobin 12.6 (*)    HCT 37.7 (*)    All other components within normal limits  URINALYSIS, ROUTINE W REFLEX MICROSCOPIC - Abnormal; Notable for the following components:   Protein, ur TRACE (*)    All other components within normal limits  D-DIMER, QUANTITATIVE  TROPONIN I (HIGH SENSITIVITY)  TROPONIN I (HIGH SENSITIVITY)    EKG EKG Interpretation  Date/Time:  Tuesday January 27 2022 12:33:55 EST Ventricular Rate:  75 PR Interval:  186 QRS Duration: 100 QT Interval:  410 QTC Calculation: 457 R Axis:   42 Text Interpretation: Normal sinus rhythm Low voltage QRS Borderline ECG When compared with ECG of 08-Apr-2021 10:39, No significant change was found Confirmed by Octaviano Glow [phone]) on 01/27/2022 1:22:40 PM  Radiology DG Chest 2 View  Result Date: 01/27/2022 CLINICAL DATA:  Chest pain. EXAM: CHEST - 2 VIEW COMPARISON:  None. FINDINGS: The heart size and mediastinal contours are within normal limits. Both lungs are clear. The visualized skeletal structures are unremarkable. IMPRESSION: No active cardiopulmonary disease. Electronically Signed   By: Marijo Conception M.D.   On: 01/27/2022 12:53    Procedures Procedures    Medications Ordered in ED Medications - No data to display  ED Course/ Medical Decision Making/ A&P Clinical Course as of 01/27/22 1623  Tue Jan 27, 2022  1441 D-Dimer, Quant: <0.27 [MT]  1507 Remains asymptomatic - if 2nd troponin wnl, anticipate discharge home [MT]    Clinical Course User Index [MT] Davan Hark, Carola Rhine, MD                           Medical Decision Making Amount and/or Complexity of Data Reviewed Labs: ordered. Decision-making details documented in ED Course. Radiology: ordered.   This patient presents to the Emergency Department with complaint of chest pain. This involves an extensive number of treatment options, and is a complaint that carries with it a  high risk of complications and morbidity, given the patient's comorbidity, including obesity.The differential diagnosis includes ACS vs Pneumothorax vs Reflux/Gastritis vs MSK pain vs Pneumonia vs other.  I felt PE was less likely given that ddimer was negative; no hypoxia; sx were intermittent  I ordered, reviewed, and interpreted labs.  Pertinent results include troponin low, CBC BMP unremarkable Patient is asymptomatic  and not requiring any pain medication I ordered imaging studies which included chest x-ray I independently visualized and interpreted imaging which showed no focal infiltrate or and the monitor tracing which showed sinus bradycardia with heart rate in the 50. I agree with the radiologist interpretation Additional history was obtained from patient's wife at bed I personally reviewed the patients ECG which showed sinus bradycardia rhythm with no acute ischemic findings  After the interventions stated above, I reevaluated the patient and found that they were asymptomatic.  Based on the patient's clinical exam, vital signs, risk factors, and ED testing, I felt that the patient's overall risk of life-threatening emergency such as ACS, PE, sepsis, or infection was low.  At this time, I felt the patient's presentation was most clinically consistent with nonspecific chest pain, but explained to the patient that this evaluation was not a definitive diagnostic workup.  I discussed outpatient follow up with primary care provider, and provided specialist office number on the patient's discharge paper if a referral was deemed necessary.  Return precautions were discussed with the patient.  I felt the patient was clinically stable for discharge.         Final Clinical Impression(s) / ED Diagnoses Final diagnoses:  Chest pain, unspecified type    Rx / DC Orders ED Discharge Orders     None         Sharifa Bucholz, Carola Rhine, MD 01/27/22 1623

## 2022-01-27 NOTE — ED Triage Notes (Signed)
Left sided chest pain that started at 0530, states sharp stabbing pain intermittent No pain at this time Stopped at 0600, started again as just a one time shocking pain  Denies SOB states "feel shaky"  Hx of Afib , takes xarelto   In clinical trial for weight loss medication

## 2022-02-10 DIAGNOSIS — Z20822 Contact with and (suspected) exposure to covid-19: Secondary | ICD-10-CM | POA: Diagnosis not present

## 2022-02-20 ENCOUNTER — Other Ambulatory Visit: Payer: Self-pay

## 2022-02-20 ENCOUNTER — Encounter: Payer: Self-pay | Admitting: Podiatry

## 2022-02-20 ENCOUNTER — Ambulatory Visit (INDEPENDENT_AMBULATORY_CARE_PROVIDER_SITE_OTHER): Payer: Medicare Other | Admitting: Podiatry

## 2022-02-20 DIAGNOSIS — B351 Tinea unguium: Secondary | ICD-10-CM

## 2022-02-20 DIAGNOSIS — R600 Localized edema: Secondary | ICD-10-CM

## 2022-02-20 DIAGNOSIS — D689 Coagulation defect, unspecified: Secondary | ICD-10-CM | POA: Diagnosis not present

## 2022-02-20 DIAGNOSIS — M79675 Pain in left toe(s): Secondary | ICD-10-CM

## 2022-02-20 DIAGNOSIS — M79674 Pain in right toe(s): Secondary | ICD-10-CM

## 2022-02-20 NOTE — Progress Notes (Signed)
This patient returns to my office for at risk foot care.  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  This patient is unable to cut nails himself since the patient cannot reach his nails.These nails are painful walking and wearing shoes. Patient also has fissures on both heels due to his callus. This patient presents for at risk foot care today.  General Appearance  Alert, conversant and in no acute stress.  Vascular  Dorsalis pedis and posterior tibial  pulses are weakly  palpable  bilaterally.  Capillary return is within normal limits  bilaterally. Temperature is within normal limits  bilaterally.  Neurologic  Senn-Weinstein monofilament wire test within normal limits  bilaterally. Muscle power within normal limits bilaterally.  Nails Thick disfigured discolored nails with subungual debris  from hallux to fifth toes bilaterally. No evidence of bacterial infection or drainage bilaterally.  Orthopedic  No limitations of motion  feet .  No crepitus or effusions noted.  No bony pathology or digital deformities noted.  Skin  normotropic skin with no porokeratosis noted bilaterally.  No signs of infections or ulcers noted.     Onychomycosis  Pain in right toes  Pain in left toes   Consent was obtained for treatment procedures.   Mechanical debridement of nails 1-5  bilaterally performed with a nail nipper.  Filed with dremel without incident. Told him to use vaseline or O'Keffes moisturizer.     Return office visit    3 months                  Told patient to return for periodic foot care and evaluation due to potential at risk complications.   Ghazi Rumpf DPM   

## 2022-03-04 ENCOUNTER — Encounter (HOSPITAL_COMMUNITY): Payer: Self-pay

## 2022-03-23 DIAGNOSIS — Z125 Encounter for screening for malignant neoplasm of prostate: Secondary | ICD-10-CM | POA: Diagnosis not present

## 2022-03-23 DIAGNOSIS — E7800 Pure hypercholesterolemia, unspecified: Secondary | ICD-10-CM | POA: Diagnosis not present

## 2022-03-23 DIAGNOSIS — G4730 Sleep apnea, unspecified: Secondary | ICD-10-CM | POA: Diagnosis not present

## 2022-03-23 DIAGNOSIS — I4819 Other persistent atrial fibrillation: Secondary | ICD-10-CM | POA: Diagnosis not present

## 2022-03-23 DIAGNOSIS — Z0000 Encounter for general adult medical examination without abnormal findings: Secondary | ICD-10-CM | POA: Diagnosis not present

## 2022-03-23 DIAGNOSIS — K219 Gastro-esophageal reflux disease without esophagitis: Secondary | ICD-10-CM | POA: Diagnosis not present

## 2022-03-23 DIAGNOSIS — I10 Essential (primary) hypertension: Secondary | ICD-10-CM | POA: Diagnosis not present

## 2022-03-23 DIAGNOSIS — Z1331 Encounter for screening for depression: Secondary | ICD-10-CM | POA: Diagnosis not present

## 2022-04-06 DIAGNOSIS — H2513 Age-related nuclear cataract, bilateral: Secondary | ICD-10-CM | POA: Diagnosis not present

## 2022-04-06 DIAGNOSIS — H35033 Hypertensive retinopathy, bilateral: Secondary | ICD-10-CM | POA: Diagnosis not present

## 2022-04-06 DIAGNOSIS — H40013 Open angle with borderline findings, low risk, bilateral: Secondary | ICD-10-CM | POA: Diagnosis not present

## 2022-04-06 DIAGNOSIS — Z20822 Contact with and (suspected) exposure to covid-19: Secondary | ICD-10-CM | POA: Diagnosis not present

## 2022-04-07 ENCOUNTER — Ambulatory Visit (HOSPITAL_COMMUNITY)
Admission: RE | Admit: 2022-04-07 | Discharge: 2022-04-07 | Disposition: A | Discharge status: Home / Self Care | Payer: Medicare Other | Source: Ambulatory Visit | Attending: Nurse Practitioner | Admitting: Nurse Practitioner

## 2022-04-07 ENCOUNTER — Encounter (HOSPITAL_COMMUNITY): Payer: Self-pay | Admitting: Nurse Practitioner

## 2022-04-07 VITALS — BP 136/72 | HR 56 | Ht 72.0 in | Wt 359.2 lb

## 2022-04-07 DIAGNOSIS — E669 Obesity, unspecified: Secondary | ICD-10-CM | POA: Diagnosis not present

## 2022-04-07 DIAGNOSIS — Z79899 Other long term (current) drug therapy: Secondary | ICD-10-CM | POA: Insufficient documentation

## 2022-04-07 DIAGNOSIS — I480 Paroxysmal atrial fibrillation: Secondary | ICD-10-CM | POA: Diagnosis not present

## 2022-04-07 DIAGNOSIS — I10 Essential (primary) hypertension: Secondary | ICD-10-CM | POA: Insufficient documentation

## 2022-04-07 DIAGNOSIS — Z7901 Long term (current) use of anticoagulants: Secondary | ICD-10-CM | POA: Diagnosis not present

## 2022-04-07 DIAGNOSIS — E785 Hyperlipidemia, unspecified: Secondary | ICD-10-CM | POA: Diagnosis not present

## 2022-04-07 DIAGNOSIS — Z6841 Body Mass Index (BMI) 40.0 and over, adult: Secondary | ICD-10-CM | POA: Diagnosis not present

## 2022-04-07 DIAGNOSIS — I4891 Unspecified atrial fibrillation: Secondary | ICD-10-CM | POA: Diagnosis not present

## 2022-04-07 DIAGNOSIS — K219 Gastro-esophageal reflux disease without esophagitis: Secondary | ICD-10-CM | POA: Diagnosis not present

## 2022-04-07 DIAGNOSIS — G4733 Obstructive sleep apnea (adult) (pediatric): Secondary | ICD-10-CM | POA: Insufficient documentation

## 2022-04-07 DIAGNOSIS — N400 Enlarged prostate without lower urinary tract symptoms: Secondary | ICD-10-CM | POA: Insufficient documentation

## 2022-04-07 DIAGNOSIS — D6869 Other thrombophilia: Secondary | ICD-10-CM

## 2022-04-07 LAB — CBC
HCT: 40.6 % (ref 39.0–52.0)
Hemoglobin: 13.3 g/dL (ref 13.0–17.0)
MCH: 29.2 pg (ref 26.0–34.0)
MCHC: 32.8 g/dL (ref 30.0–36.0)
MCV: 89 fL (ref 80.0–100.0)
Platelets: 182 10*3/uL (ref 150–400)
RBC: 4.56 MIL/uL (ref 4.22–5.81)
RDW: 13.2 % (ref 11.5–15.5)
WBC: 6.5 10*3/uL (ref 4.0–10.5)
nRBC: 0 % (ref 0.0–0.2)

## 2022-04-07 LAB — MAGNESIUM: Magnesium: 2.1 mg/dL (ref 1.7–2.4)

## 2022-04-07 NOTE — Progress Notes (Signed)
? ?Primary Care Physician: [name], MD ?Referring Physician:Dr. [name], MD ?Cardiologist: Dr. Ellyn Hack  ? ? ?Christopher Hanson is a 71 y.o. male with a h/o HTN, BPH, sleep apnea, obesity,GERD, hyperlipidemia that was found to be in afib, rate at 103 and asymptomatic, at routine PCP visit  3/18. He was already on metoprolol and this dose was doubled yesterday. Today HR in the 60's in afib   He was also started on anticoagulation, Xarelto 20 mg daily, with a CHA2DS2VASc score of 2. Denies  bleeding history. He has had both covid shots, last one 1-2 weeks ago.  ? ?He drinks moderate caffeine, does not use tobacco, minimal alcohol. Wife states significant snoring with apnea. Has not had a sleep study in the past. He had labs drawn at PCP and heard last night labs were WNL. He has a BMI of 51.63, weight of 370 lbs.  He was going to gym regularly before covid, got a rowing machine afterward, hurt his back and is now not doing any regular exercise. He has gained back around 36 lbs. ? ?F/u in afib clinic,4/2, he remains in rate controlled afib, asymptomatic. Fluid status is stable. He has not missed any anticoagulation since started 3/19. He feels well. Echo showed  EF of 55-65%, with moderate LVH and severely dilated Left atrium. He would be eligible for a cardioversion after 4/9, if no missed DOAC, but he would like to wait until he hears about the date set for sleep study with his appointment with Dr. Maxwell Caul set for 4/8.  ? ?He is now back in afib clinic, 5/12,  had positive  sleep study and has been on cpap for 2 nights. He wants to proceed with cardioversion. Has been on anticoagulation for over 2 weeks, no missed doses.  ? ?F/u in afib clinic 5/26. Unfortunately, DCCV was not successful.  It was not entirely surprising as pt has a severely dilated left atrium and his morbid  obesity is also contributing. He has been on cpap now x  2 weeks.  ? ?Afib clinic, 6/15, He is here for tikosyn admit. No benadryl  products, no missed anticoagulation. He was taken off  Triamterene/HCTZ on 6/12 , as it could enhance levels of tikosyn, and started on lisinopril 10 mg with controlled BP.  ? ?F/u in afib clinic, 6/25, after Tikosyn admit. He is in SR but does not feel any better. He has had some soft BP's, a tickle type cough and mild lightheadedness since starting lisinopril. This was started since triamterene/hctz was stopped for Tikosyn to start. Qtc  is acceptable .  ? ?F/u in afib clinic, 4/26, for Tikosyn f/u. He is staying in SR, but HR 's have been running  in the 40's. He is  feeling like his hands and left foot are swelling. He is eating meal plan from  semi prepared food, some have a lot of sodium in them. Dr. Ellyn Hack would prefer not for him to go on diuretic because of Tikosyn. He also drinks a lot of coffee during the day, half decaf in the afternoon, probably 2 pots a day. ? ?F/u in afib clinic, 04/07/22, for Tikosyn surveillance. He is in a clinical trail for weight loss and has lost 49 lbs. He is not sure if on placebo or not but has changed his eating habits and is exercising 150 mins a week. He noted 36 hours of afib a few weeks ago. He would not have noted this if not picked up  on BP cuff.   ?  ?Today, he denies symptoms of palpitations, chest pain, shortness of breath, orthopnea, PND, lower extremity edema, dizziness, presyncope, syncope, or neurologic sequela. The patient is tolerating medications without difficulties and is otherwise without complaint today.  ? ?Past Medical History:  ?Diagnosis Date  ? GERD (gastroesophageal reflux disease)   ? Hyperlipemia   ? Hypertension   ? Morbidly obese ([place])   ? BMI 50  ? OSA on CPAP 03/2020  ? Diagnosed April-May 2021  ? Paroxysmal atrial fibrillation ([place]) 01/2018  ? Persistent paroxysmal A. fib; s/p facilitated DCCV while on Tikosyn (May 16, 2020)  ? Seasonal allergies   ? Wears glasses   ? ?Past Surgical History:  ?Procedure Laterality Date  ? CARDIOVERSION N/A  04/17/2020  ? Procedure: CARDIOVERSION;  Surgeon: [name], MD;  Location: [facility];  Service: Cardiovascular;  Laterality: N/A;  ? CARDIOVERSION N/A 05/16/2020  ? Procedure: CARDIOVERSION;  Surgeon: [name], MD;  Location: [facility] ENDOSCOPY;  Service: Cardiovascular;  Laterality: N/A;  ? CARPAL TUNNEL RELEASE    ? right and left  ? COLONOSCOPY    ? CYST EXCISION    ? face  ? EAR CYST EXCISION Left 02/11/2015  ? Procedure: EXCISION OF LEFT POST AURICULAR CYST;  Surgeon: [name], MD;  Location: [facility];  Service: ENT;  Laterality: Left;  ? INGUINAL HERNIA REPAIR    ? bilat-as infant  ? NASAL SINUS SURGERY  2000  ? ? ?Current Outpatient Medications  ?Medication Sig Dispense Refill  ? azelastine (ASTELIN) 0.1 % nasal spray as needed.    ? azelastine (ASTELIN) 0.1 % nasal spray Place 1 spray into both nostrils as needed.    ? calcium gluconate 500 MG tablet Take 1 tablet by mouth daily. Calcium, Magnesium, zinc and Vitamin D in it.    ? Cholecalciferol (VITAMIN D3 PO) Take 2,000 Units by mouth every morning.    ? Cholecalciferol (VITAMIN D3) 50 MCG (2000 UT) TABS Take 2,000 Units by mouth daily.    ? dofetilide (TIKOSYN) 500 MCG capsule Take 1 capsule (500 mcg total) by mouth 2 (two) times daily. 60 capsule 9  ? ezetimibe (ZETIA) 10 MG tablet Take 10 mg by mouth at bedtime.     ? ferrous sulfate 325 (65 FE) MG tablet Take 325 mg by mouth daily with breakfast.    ? metoprolol succinate (TOPROL-XL) 50 MG 24 hr tablet TAKE 1 AND 1/2 TABLETS(75 MG) BY MOUTH DAILY 135 tablet 1  ? Multiple Vitamins-Minerals (MENS MULTIVITAMIN) TABS Take 1 tablet by mouth daily.    ? omeprazole (PRILOSEC) 40 MG capsule Take 40 mg by mouth daily.    ? potassium chloride (KLOR-CON) 10 MEQ tablet Take 1 tablet (10 mEq total) by mouth daily. 90 tablet 3  ? simvastatin (ZOCOR) 40 MG tablet Take 40 mg by mouth at bedtime.    ? tamsulosin (FLOMAX) 0.4 MG CAPS capsule Take 0.4 mg by mouth in the morning  and at bedtime.     ? valsartan (DIOVAN) 80 MG tablet TAKE 1 TABLET(80 MG) BY MOUTH DAILY 90 tablet 3  ? XARELTO 20 MG TABS tablet TAKE 1 TABLET(20 MG) BY MOUTH DAILY WITH SUPPER 30 tablet 11  ? ?No current facility-administered medications for this encounter.  ? ? ?Allergies  ?Allergen Reactions  ? Lisinopril Cough  ? ? ?Social History  ? ?Socioeconomic History  ? Marital status: Married  ?  Spouse name: Not on file  ?  Number of children: Not on file  ? Years of education: Not on file  ? Highest education level: Not on file  ?Occupational History  ? Not on file  ?Tobacco Use  ? Smoking status: Never  ? Smokeless tobacco: Never  ?Vaping Use  ? Vaping Use: Never used  ?Substance and Sexual Activity  ? Alcohol use: Yes  ?  Alcohol/week: 2.0 standard drinks  ?  Types: 1 Glasses of wine, 1 Shots of liquor per week  ?  Comment: occ  ? Drug use: No  ? Sexual activity: Not on file  ?Other Topics Concern  ? Not on file  ?Social History Narrative  ? Not on file  ? ?Social Determinants of Health  ? ?Financial Resource Strain: Not on file  ?Food Insecurity: Not on file  ?Transportation Needs: Not on file  ?Physical Activity: Not on file  ?Stress: Not on file  ?Social Connections: Not on file  ?Intimate Partner Violence: Not on file  ? ? ?No family history on file. ? ?ROS- All systems are reviewed and negative except as per the HPI above ? ?Physical Exam: ?Vitals:  ? 04/07/22 0926  ?BP: 136/72  ?Pulse: (!) 56  ?Weight: (!) 162.9 kg  ?Height: 6' (1.829 m)  ? ?Wt Readings from Last 3 Encounters:  ?04/07/22 (!) 162.9 kg  ?01/27/22 (!) 171.5 kg  ?10/06/21 (!) 180.5 kg  ? ? ?Labs: ?Lab Results  ?Component Value Date  ? NA 136 01/27/2022  ? K 3.8 01/27/2022  ? CL 103 01/27/2022  ? CO2 23 01/27/2022  ? GLUCOSE 118 (H) 01/27/2022  ? BUN 18 01/27/2022  ? CREATININE 0.94 01/27/2022  ? CALCIUM 9.2 01/27/2022  ? MG 2.0 10/06/2021  ? ?Lab Results  ?Component Value Date  ? INR 1.4 (H) 05/16/2020  ? ?No results found for: CHOL, HDL, LDLCALC,  TRIG ? ? ?GEN- The patient is well appearing, alert and oriented x 3 today.   ?Head- normocephalic, atraumatic ?Eyes-  Sclera clear, conjunctiva pink ?Ears- hearing intact ?Oropharynx- clear ?Neck- supple

## 2022-04-13 ENCOUNTER — Other Ambulatory Visit: Payer: Self-pay

## 2022-04-13 MED ORDER — POTASSIUM CHLORIDE ER 10 MEQ PO TBCR: 10.0000 meq | EXTENDED_RELEASE_TABLET | Freq: Every day | ORAL | 2 refills | Status: DC

## 2022-05-13 ENCOUNTER — Other Ambulatory Visit (HOSPITAL_COMMUNITY): Payer: Self-pay | Admitting: Nurse Practitioner

## 2022-05-19 DIAGNOSIS — E7800 Pure hypercholesterolemia, unspecified: Secondary | ICD-10-CM | POA: Diagnosis not present

## 2022-05-19 DIAGNOSIS — I10 Essential (primary) hypertension: Secondary | ICD-10-CM | POA: Diagnosis not present

## 2022-05-19 DIAGNOSIS — N401 Enlarged prostate with lower urinary tract symptoms: Secondary | ICD-10-CM | POA: Diagnosis not present

## 2022-05-29 ENCOUNTER — Ambulatory Visit: Payer: Medicare Other | Admitting: Podiatry

## 2022-06-04 DIAGNOSIS — M549 Dorsalgia, unspecified: Secondary | ICD-10-CM | POA: Diagnosis not present

## 2022-06-04 IMAGING — DX DG CHEST 2V
2 series · 2 of 2 positions shown · non-contrast
Comparison: None.

CLINICAL DATA: Chest pain.

EXAM:
CHEST - 2 VIEW

[chest pa]
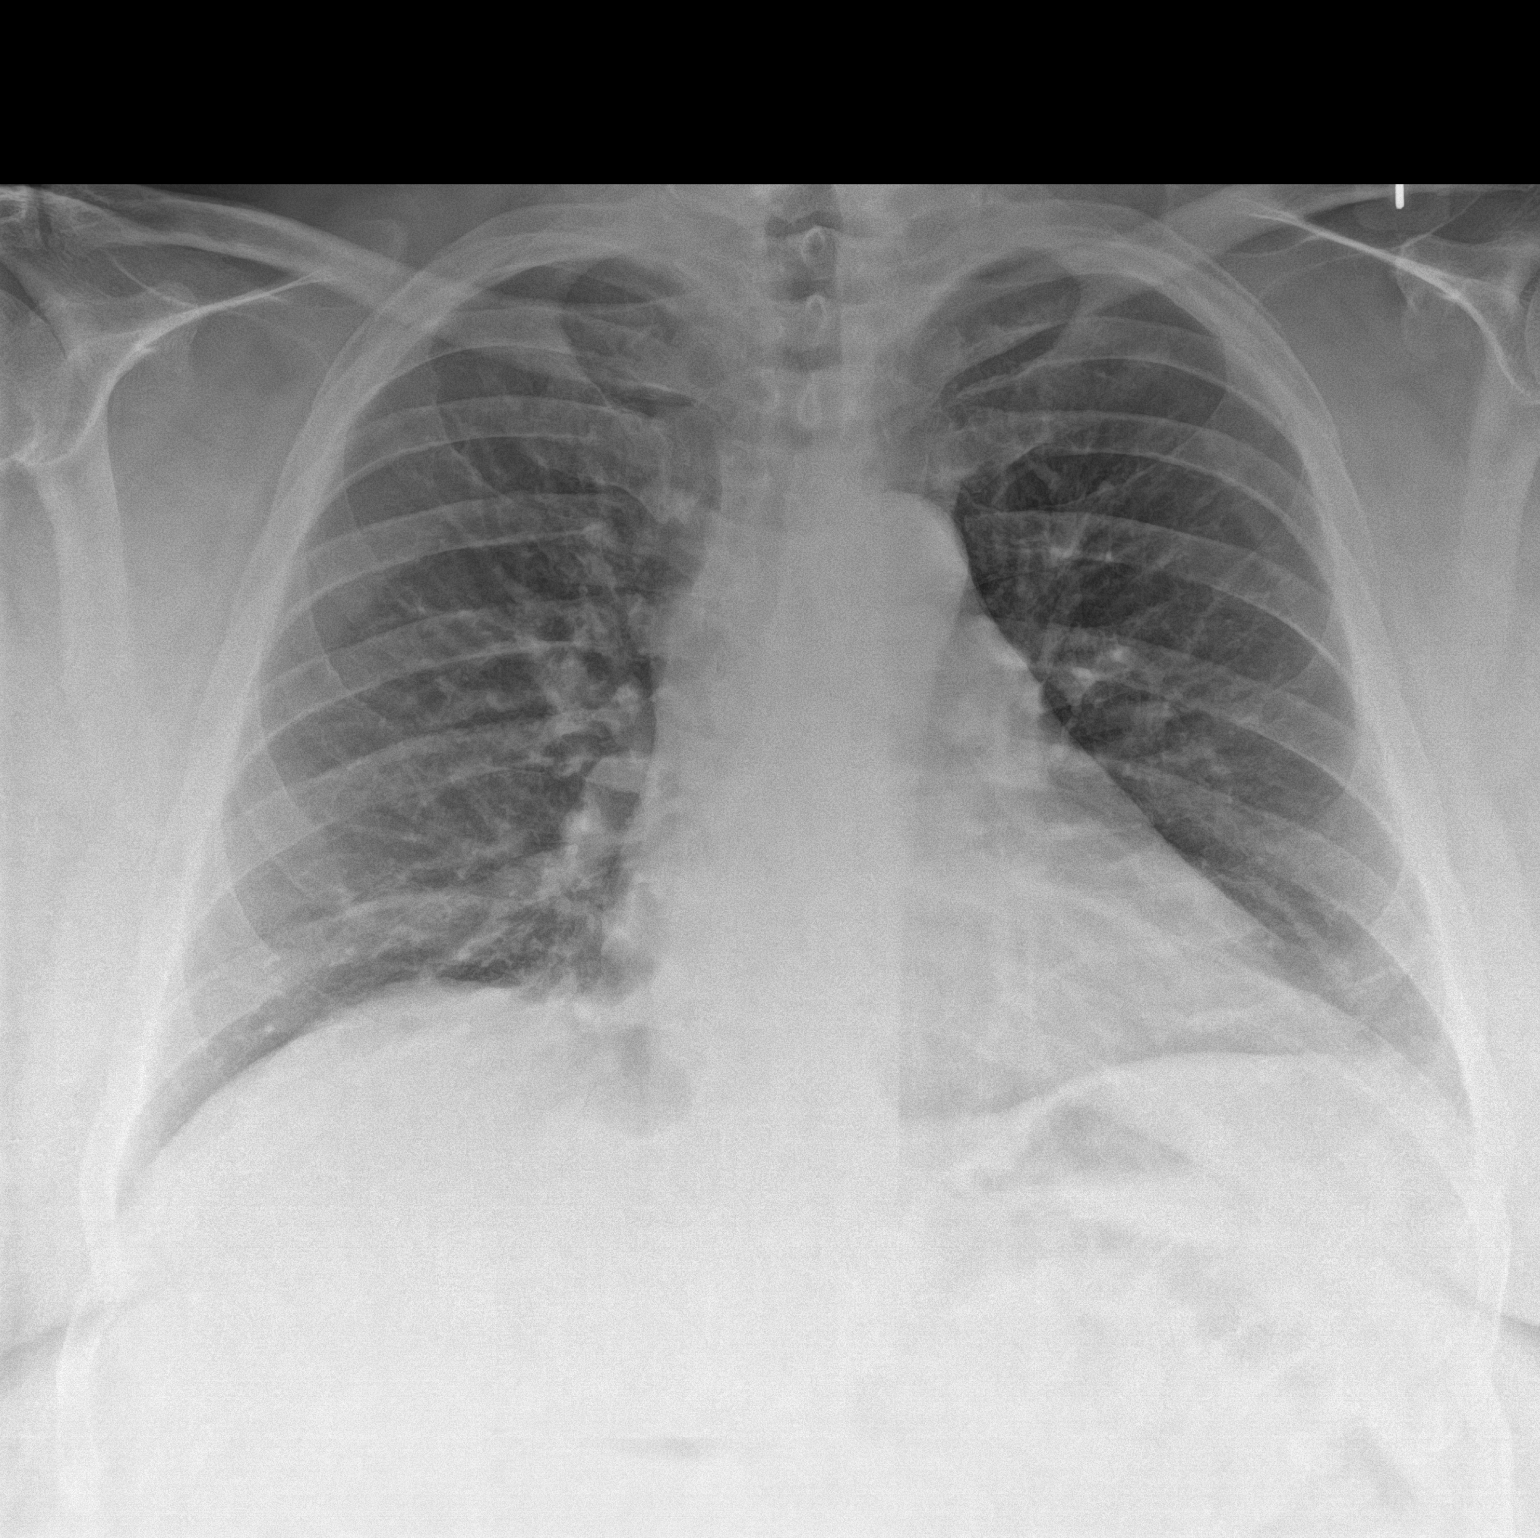

[chest lat]
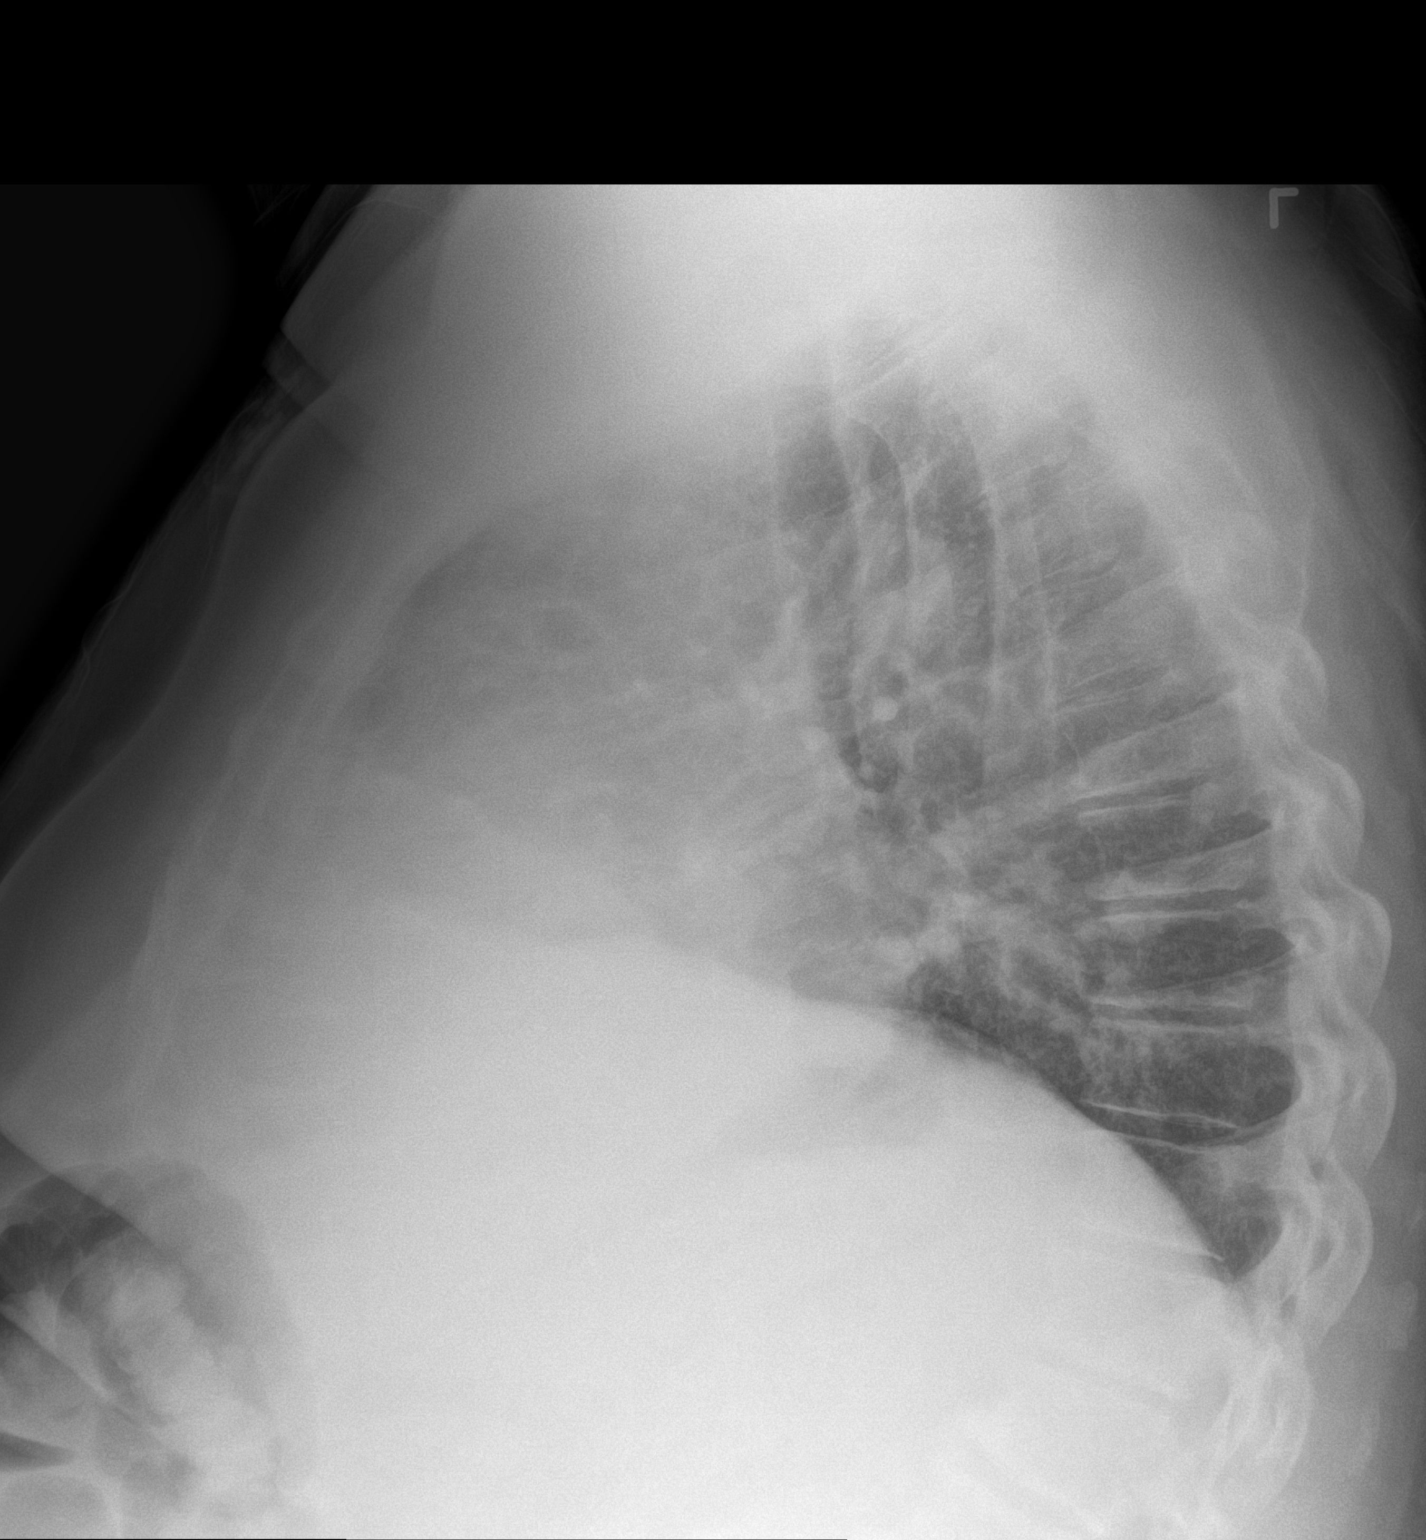

[2 of 2 positions shown; findings below may reference images not displayed]

FINDINGS: The heart size and mediastinal contours are within normal limits.
Both lungs are clear. The visualized skeletal structures are
unremarkable.
IMPRESSION: No active cardiopulmonary disease.

## 2022-06-08 ENCOUNTER — Encounter: Payer: Self-pay | Admitting: Podiatry

## 2022-06-08 ENCOUNTER — Ambulatory Visit (INDEPENDENT_AMBULATORY_CARE_PROVIDER_SITE_OTHER): Payer: Medicare Other | Admitting: Podiatry

## 2022-06-08 DIAGNOSIS — M79675 Pain in left toe(s): Secondary | ICD-10-CM

## 2022-06-08 DIAGNOSIS — R600 Localized edema: Secondary | ICD-10-CM

## 2022-06-08 DIAGNOSIS — D689 Coagulation defect, unspecified: Secondary | ICD-10-CM

## 2022-06-08 DIAGNOSIS — B351 Tinea unguium: Secondary | ICD-10-CM

## 2022-06-08 DIAGNOSIS — M79674 Pain in right toe(s): Secondary | ICD-10-CM | POA: Diagnosis not present

## 2022-06-08 NOTE — Progress Notes (Signed)
This patient returns to my office for at risk foot care.  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  This patient is unable to cut nails himself since the patient cannot reach his nails.These nails are painful walking and wearing shoes. Patient also has fissures on both heels due to his callus. This patient presents for at risk foot care today.  General Appearance  Alert, conversant and in no acute stress.  Vascular  Dorsalis pedis and posterior tibial  pulses are weakly  palpable  bilaterally.  Capillary return is within normal limits  bilaterally. Temperature is within normal limits  bilaterally.  Neurologic  Senn-Weinstein monofilament wire test within normal limits  bilaterally. Muscle power within normal limits bilaterally.  Nails Thick disfigured discolored nails with subungual debris  from hallux to fifth toes bilaterally. No evidence of bacterial infection or drainage bilaterally.  Orthopedic  No limitations of motion  feet .  No crepitus or effusions noted.  No bony pathology or digital deformities noted.  Skin  normotropic skin with no porokeratosis noted bilaterally.  No signs of infections or ulcers noted.     Onychomycosis  Pain in right toes  Pain in left toes   Consent was obtained for treatment procedures.   Mechanical debridement of nails 1-5  bilaterally performed with a nail nipper.  Filed with dremel without incident. Told him to use vaseline or O'Keffes moisturizer.     Return office visit    3 months                  Told patient to return for periodic foot care and evaluation due to potential at risk complications.   Ketzia Guzek DPM   

## 2022-06-10 ENCOUNTER — Other Ambulatory Visit: Payer: Self-pay | Admitting: Cardiology

## 2022-06-16 DIAGNOSIS — N400 Enlarged prostate without lower urinary tract symptoms: Secondary | ICD-10-CM | POA: Diagnosis not present

## 2022-06-16 DIAGNOSIS — K219 Gastro-esophageal reflux disease without esophagitis: Secondary | ICD-10-CM | POA: Diagnosis not present

## 2022-06-16 DIAGNOSIS — E7800 Pure hypercholesterolemia, unspecified: Secondary | ICD-10-CM | POA: Diagnosis not present

## 2022-06-16 DIAGNOSIS — I10 Essential (primary) hypertension: Secondary | ICD-10-CM | POA: Diagnosis not present

## 2022-06-30 ENCOUNTER — Other Ambulatory Visit: Payer: Self-pay | Admitting: Family Medicine

## 2022-06-30 DIAGNOSIS — M5417 Radiculopathy, lumbosacral region: Secondary | ICD-10-CM

## 2022-06-30 DIAGNOSIS — M5416 Radiculopathy, lumbar region: Secondary | ICD-10-CM

## 2022-07-11 ENCOUNTER — Ambulatory Visit
Admission: RE | Admit: 2022-07-11 | Discharge: 2022-07-11 | Disposition: A | Discharge status: Home / Self Care | Payer: Medicare Other | Source: Ambulatory Visit | Attending: Family Medicine | Admitting: Family Medicine

## 2022-07-11 DIAGNOSIS — M48061 Spinal stenosis, lumbar region without neurogenic claudication: Secondary | ICD-10-CM | POA: Diagnosis not present

## 2022-07-11 DIAGNOSIS — M5417 Radiculopathy, lumbosacral region: Secondary | ICD-10-CM

## 2022-07-11 DIAGNOSIS — M545 Low back pain, unspecified: Secondary | ICD-10-CM | POA: Diagnosis not present

## 2022-07-11 DIAGNOSIS — M25551 Pain in right hip: Secondary | ICD-10-CM | POA: Diagnosis not present

## 2022-07-11 DIAGNOSIS — M5416 Radiculopathy, lumbar region: Secondary | ICD-10-CM

## 2022-07-11 DIAGNOSIS — M47816 Spondylosis without myelopathy or radiculopathy, lumbar region: Secondary | ICD-10-CM | POA: Diagnosis not present

## 2022-07-15 DIAGNOSIS — M79621 Pain in right upper arm: Secondary | ICD-10-CM | POA: Diagnosis not present

## 2022-07-15 DIAGNOSIS — S46111A Strain of muscle, fascia and tendon of long head of biceps, right arm, initial encounter: Secondary | ICD-10-CM | POA: Diagnosis not present

## 2022-07-21 DIAGNOSIS — M5451 Vertebrogenic low back pain: Secondary | ICD-10-CM | POA: Diagnosis not present

## 2022-07-21 DIAGNOSIS — M546 Pain in thoracic spine: Secondary | ICD-10-CM | POA: Diagnosis not present

## 2022-07-30 DIAGNOSIS — M546 Pain in thoracic spine: Secondary | ICD-10-CM | POA: Diagnosis not present

## 2022-08-12 DIAGNOSIS — G4733 Obstructive sleep apnea (adult) (pediatric): Secondary | ICD-10-CM | POA: Diagnosis not present

## 2022-08-17 DIAGNOSIS — M5416 Radiculopathy, lumbar region: Secondary | ICD-10-CM | POA: Diagnosis not present

## 2022-08-21 ENCOUNTER — Ambulatory Visit: Payer: Medicare Other | Attending: Cardiology | Admitting: Cardiology

## 2022-08-21 VITALS — BP 140/75 | Ht 72.0 in | Wt 352.0 lb

## 2022-08-21 DIAGNOSIS — I4819 Other persistent atrial fibrillation: Secondary | ICD-10-CM

## 2022-08-21 LAB — BASIC METABOLIC PANEL
BUN/Creatinine Ratio: 19 (ref 10–24)
BUN: 19 mg/dL (ref 8–27)
CO2: 24 mmol/L (ref 20–29)
Calcium: 9.8 mg/dL (ref 8.6–10.2)
Chloride: 104 mmol/L (ref 96–106)
Creatinine, Ser: 0.98 mg/dL (ref 0.76–1.27)
Glucose: 98 mg/dL (ref 70–99)
Potassium: 4.5 mmol/L (ref 3.5–5.2)
Sodium: 140 mmol/L (ref 134–144)
eGFR: 83 mL/min/{1.73_m2} (ref >59)

## 2022-08-21 LAB — MAGNESIUM: Magnesium: 2 mg/dL (ref 1.6–2.3)

## 2022-08-21 NOTE — Progress Notes (Deleted)
Electrophysiology Office Follow up Visit Note:    Date:  08/21/2022   ID:  Christopher Hanson, DOB 11-Oct-1951, MRN [id number]  PCP:  [name], MD  [facility] Cardiologist:  [name], MD  [facility] Electrophysiologist:  [name], MD    Interval History:    Christopher Hanson is a 71 y.o. male who presents for a follow up visit. They were last seen in clinic by Roderic Palau on Apr 07, 2022.  The patient was previously followed by Dr. Rayann Heman.  He has a history of persistent atrial fibrillation on Xarelto and Tikosyn.  He is working on weight loss through a clinical trial.       Past Medical History:  Diagnosis Date   GERD (gastroesophageal reflux disease)    Hyperlipemia    Hypertension    Morbidly obese (HCC)    BMI 50   OSA on CPAP 03/2020   Diagnosed April-May 2021   Paroxysmal atrial fibrillation ([place]) 01/2018   Persistent paroxysmal A. fib; s/p facilitated DCCV while on Tikosyn (May 16, 2020)   Seasonal allergies    Wears glasses     Past Surgical History:  Procedure Laterality Date   CARDIOVERSION N/A 04/17/2020   Procedure: CARDIOVERSION;  Surgeon: [name], MD;  Location: [place];  Service: Cardiovascular;  Laterality: N/A;   CARDIOVERSION N/A 05/16/2020   Procedure: CARDIOVERSION;  Surgeon: [name], MD;  Location: [place];  Service: Cardiovascular;  Laterality: N/A;   CARPAL TUNNEL RELEASE     right and left   COLONOSCOPY     CYST EXCISION     face   EAR CYST EXCISION Left 02/11/2015   Procedure: EXCISION OF LEFT POST AURICULAR CYST;  Surgeon: [name], MD;  Location: [place];  Service: ENT;  Laterality: Left;   INGUINAL HERNIA REPAIR     bilat-as infant   NASAL SINUS SURGERY  2000    Current Medications: No outpatient medications have been marked as taking for the 08/21/22 encounter (Appointment) with Vickie Epley, MD.     Allergies:   Lisinopril   Social History    Socioeconomic History   Marital status: Married    Spouse name: Not on file   Number of children: Not on file   Years of education: Not on file   Highest education level: Not on file  Occupational History   Not on file  Tobacco Use   Smoking status: Never   Smokeless tobacco: Never  Vaping Use   Vaping Use: Never used  Substance and Sexual Activity   Alcohol use: Yes    Alcohol/week: 2.0 standard drinks of alcohol    Types: 1 Glasses of wine, 1 Shots of liquor per week    Comment: occ   Drug use: No   Sexual activity: Not on file  Other Topics Concern   Not on file  Social History Narrative   Not on file   Social Determinants of Health   Financial Resource Strain: Not on file  Food Insecurity: Not on file  Transportation Needs: Not on file  Physical Activity: Not on file  Stress: Not on file  Social Connections: Not on file     Family History: The patient's family history is not on file.  ROS:   Please see the history of present illness.    All other systems reviewed and are negative.  EKGs/Labs/Other Studies Reviewed:    The following studies were reviewed today:    EKG:  The ekg ordered today demonstrates ***  Recent Labs: 01/27/2022: BUN 18; Creatinine, Ser 0.94; Potassium 3.8; Sodium 136 04/07/2022: Hemoglobin 13.3; Magnesium 2.1; Platelets 182  Recent Lipid Panel No results found for: "CHOL", "TRIG", "HDL", "CHOLHDL", "VLDL", "LDLCALC", "LDLDIRECT"  Physical Exam:    VS:  There were no vitals taken for this visit.    Wt Readings from Last 3 Encounters:  04/07/22 (!) 359 lb 3.2 oz (162.9 kg)  01/27/22 (!) 378 lb (171.5 kg)  10/06/21 (!) 398 lb (180.5 kg)     GEN: *** Well nourished, well developed in no acute distress HEENT: Normal NECK: No JVD; No carotid bruits LYMPHATICS: No lymphadenopathy CARDIAC: ***RRR, no murmurs, rubs, gallops RESPIRATORY:  Clear to auscultation without rales, wheezing or rhonchi  ABDOMEN: Soft, non-tender,  non-distended MUSCULOSKELETAL:  No edema; No deformity  SKIN: Warm and dry NEUROLOGIC:  Alert and oriented x 3 PSYCHIATRIC:  Normal affect        ASSESSMENT:    No diagnosis found. PLAN:    In order of problems listed above:  #Persistent atrial fibrillation #Tikosyn monitoring Last creatinine was 0.94 on January 27, 2022. Update lab work today. EKG is acceptable for ongoing Tikosyn use.  #Obesity Weight loss encouraged.   Medication Adjustments/Labs and Tests Ordered: Current medicines are reviewed at length with the patient today.  Concerns regarding medicines are outlined above.  No orders of the defined types were placed in this encounter.  No orders of the defined types were placed in this encounter.    Signed, Lars Mage, MD, [facility], [facility] 08/21/2022 5:54 AM    Electrophysiology [facility]

## 2022-08-21 NOTE — Patient Instructions (Signed)
Medication Instructions:  none *If you need a refill on your cardiac medications before your next appointment, please call your pharmacy*   Lab Work: BMP, MAG If you have labs (blood work) drawn today and your tests are completely normal, you will receive your results only by: [place] (if you have MyChart) OR A paper copy in the mail If you have any lab test that is abnormal or we need to change your treatment, we will call you to review the results.   Testing/Procedures: none   Follow-Up: At [facility], you and your health needs are our priority.  As part of our continuing mission to provide you with exceptional heart care, we have created designated Provider Care Teams.  These Care Teams include your primary Cardiologist (physician) and Advanced Practice Providers (APPs -  Physician Assistants and Nurse Practitioners) who all work together to provide you with the care you need, when you need it.  We recommend signing up for the patient portal called "MyChart".  Sign up information is provided on this After Visit Summary.  MyChart is used to connect with patients for Virtual Visits (Telemedicine).  Patients are able to view lab/test results, encounter notes, upcoming appointments, etc.  Non-urgent messages can be sent to your provider as well.   To learn more about what you can do with MyChart, go to [email or web address].    Your next appointment:   3 month(s)  The format for your next appointment:   In Person  Provider:   You will see one of the following Advanced Practice Providers on your designated Care Team:   Tommye Standard, [place] Legrand Como "Jonni Sanger" Chalmers Cater, [place]      Other Instructions none  Important Information About Sugar

## 2022-08-21 NOTE — Progress Notes (Signed)
Electrophysiology Office Follow up Visit Note:    Date:  08/21/2022   ID:  Christopher Hanson, DOB 1951/06/30, MRN [id number]  PCP:  [name], MD  [facility] Cardiologist:  [name], MD  [facility] Electrophysiologist:  Vickie Epley, MD    Interval History:    Christopher Hanson is a 71 y.o. male who presents for a follow up visit. They were last seen in clinic by Roderic Palau on Apr 07, 2022.  The patient was previously followed by Dr. Rayann Heman.  He has a history of persistent atrial fibrillation on Xarelto and Tikosyn.  He is working on weight loss through a clinical trial.  Today, he reports doing well on Tikosyn. He denies any recurrent arrhythmic episodes. Typically he is unaware of any arrhythmias unless it is detected by his home blood pressure cuff.  In July he suffered a right arm injury and had to stop going to the gym for a time. He has since returned to exercising, but no upper body routines, usually leg presses.  He endorses persistent mild LE edema that seems to be stable.  He remains compliant with Xarelto; no bleeding issues at this time.  He denies any palpitations, chest pain, shortness of breath. No lightheadedness, headaches, syncope, orthopnea, or PND.      Past Medical History:  Diagnosis Date   GERD (gastroesophageal reflux disease)    Hyperlipemia    Hypertension    Morbidly obese (HCC)    BMI 50   OSA on CPAP 03/2020   Diagnosed April-May 2021   Paroxysmal atrial fibrillation ([place]) 01/2018   Persistent paroxysmal A. fib; s/p facilitated DCCV while on Tikosyn (May 16, 2020)   Seasonal allergies    Wears glasses     Past Surgical History:  Procedure Laterality Date   CARDIOVERSION N/A 04/17/2020   Procedure: CARDIOVERSION;  Surgeon: [name], MD;  Location: [place];  Service: Cardiovascular;  Laterality: N/A;   CARDIOVERSION N/A 05/16/2020   Procedure: CARDIOVERSION;  Surgeon: [name], MD;  Location:  [place];  Service: Cardiovascular;  Laterality: N/A;   CARPAL TUNNEL RELEASE     right and left   COLONOSCOPY     CYST EXCISION     face   EAR CYST EXCISION Left 02/11/2015   Procedure: EXCISION OF LEFT POST AURICULAR CYST;  Surgeon: [name], MD;  Location: [place];  Service: ENT;  Laterality: Left;   INGUINAL HERNIA REPAIR     bilat-as infant   NASAL SINUS SURGERY  2000    Current Medications: No outpatient medications have been marked as taking for the 08/21/22 encounter (Office Visit) with Vickie Epley, MD.     Allergies:   Lisinopril   Social History   Socioeconomic History   Marital status: Married    Spouse name: Not on file   Number of children: Not on file   Years of education: Not on file   Highest education level: Not on file  Occupational History   Not on file  Tobacco Use   Smoking status: Never   Smokeless tobacco: Never  Vaping Use   Vaping Use: Never used  Substance and Sexual Activity   Alcohol use: Yes    Alcohol/week: 2.0 standard drinks of alcohol    Types: 1 Glasses of wine, 1 Shots of liquor per week    Comment: occ   Drug use: No   Sexual activity: Not on file  Other Topics Concern   Not  on file  Social History Narrative   Not on file   Social Determinants of Health   Financial Resource Strain: Not on file  Food Insecurity: Not on file  Transportation Needs: Not on file  Physical Activity: Not on file  Stress: Not on file  Social Connections: Not on file     Family History: The patient's family history is not on file.  ROS:   Please see the history of present illness.   (+) Bilateral LE edema  All other systems reviewed and are negative.  EKGs/Labs/Other Studies Reviewed:    The following studies were reviewed today:  Echo  02/20/2020: Sonographer Comments: Patient is morbidly obese. Image acquisition  challenging due to patient body habitus.  IMPRESSIONS    1. Left ventricular ejection  fraction, by estimation, is 55 to 60%. The  left ventricle has normal function. The left ventricle has no regional  wall motion abnormalities. There is moderate left ventricular hypertrophy.  Left ventricular diastolic  parameters are indeterminate.   2. Right ventricular systolic function is normal. The right ventricular  size is normal. There is mildly elevated pulmonary artery systolic  pressure.   3. Left atrial size was severely dilated.   4. Right atrial size was moderately dilated.   5. The mitral valve is normal in structure. Trivial mitral valve  regurgitation. No evidence of mitral stenosis.   6. The aortic valve is tricuspid. Aortic valve regurgitation is not  visualized. No aortic stenosis is present.   7. The inferior vena cava is dilated in size with >50% respiratory  variability, suggesting right atrial pressure of 8 mmHg.   EKG:  EKG is personally reviewed.  08/21/2022:  sinus rhythm with low amplitude P waves. QTc 463m.  Recent Labs: 01/27/2022: BUN 18; Creatinine, Ser 0.94; Potassium 3.8; Sodium 136 04/07/2022: Hemoglobin 13.3; Magnesium 2.1; Platelets 182   Recent Lipid Panel No results found for: "CHOL", "TRIG", "HDL", "CHOLHDL", "VLDL", "LDLCALC", "LDLDIRECT"  Physical Exam:    VS:  BP (!) 140/75   Ht 6' (1.829 m)   Wt (!) 352 lb (159.7 kg)   BMI 47.74 kg/m     Wt Readings from Last 3 Encounters:  08/21/22 (!) 352 lb (159.7 kg)  04/07/22 (!) 359 lb 3.2 oz (162.9 kg)  01/27/22 (!) 378 lb (171.5 kg)     GEN: Well nourished, well developed in no acute distress HEENT: Normal NECK: No JVD; No carotid bruits LYMPHATICS: No lymphadenopathy CARDIAC: RRR, no murmurs, rubs, gallops RESPIRATORY:  Clear to auscultation without rales, wheezing or rhonchi  ABDOMEN: Soft, non-tender, non-distended MUSCULOSKELETAL: Trivial bilateral lower extremity pitting edema to the mid shins; No deformity  SKIN: Warm and dry NEUROLOGIC:  Alert and oriented x 3 PSYCHIATRIC:   Normal affect        ASSESSMENT:    1. Persistent atrial fibrillation ([place]   2. Morbid obesity ([place]    PLAN:    In order of problems listed above:  #Persistent atrial fibrillation #Tikosyn monitoring Last creatinine was 0.94 on January 27, 2022. Update lab work today. EKG is acceptable for ongoing Tikosyn use.   #Obesity Weight loss encouraged.  Follow-up in 3 months.     Medication Adjustments/Labs and Tests Ordered: Current medicines are reviewed at length with the patient today.  Concerns regarding medicines are outlined above.   No orders of the defined types were placed in this encounter.  No orders of the defined types were placed in this encounter.   I,Mathew Stumpf,acting as  a scribe for Vickie Epley, MD.,have documented all relevant documentation on the behalf of Vickie Epley, MD,as directed by  Vickie Epley, MD while in the presence of Vickie Epley, MD.  I, Vickie Epley, MD, have reviewed all documentation for this visit. The documentation on 08/21/22 for the exam, diagnosis, procedures, and orders are all accurate and complete.   Signed, Lars Mage, MD, [facility], [facility] 08/21/2022 8:43 AM    Electrophysiology [facility]

## 2022-08-27 ENCOUNTER — Ambulatory Visit (HOSPITAL_BASED_OUTPATIENT_CLINIC_OR_DEPARTMENT_OTHER): Payer: Medicare Other | Admitting: Physical Therapy

## 2022-09-04 ENCOUNTER — Ambulatory Visit (INDEPENDENT_AMBULATORY_CARE_PROVIDER_SITE_OTHER): Payer: Medicare Other | Admitting: Podiatry

## 2022-09-04 ENCOUNTER — Encounter: Payer: Self-pay | Admitting: Podiatry

## 2022-09-04 DIAGNOSIS — D689 Coagulation defect, unspecified: Secondary | ICD-10-CM

## 2022-09-04 DIAGNOSIS — M79675 Pain in left toe(s): Secondary | ICD-10-CM | POA: Diagnosis not present

## 2022-09-04 DIAGNOSIS — R600 Localized edema: Secondary | ICD-10-CM | POA: Diagnosis not present

## 2022-09-04 DIAGNOSIS — M79674 Pain in right toe(s): Secondary | ICD-10-CM | POA: Diagnosis not present

## 2022-09-04 DIAGNOSIS — B351 Tinea unguium: Secondary | ICD-10-CM | POA: Diagnosis not present

## 2022-09-04 NOTE — Progress Notes (Signed)
This patient returns to my office for at risk foot care.  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  This patient is unable to cut nails himself since the patient cannot reach his nails.These nails are painful walking and wearing shoes. Patient also has fissures on both heels due to his callus. This patient presents for at risk foot care today.  General Appearance  Alert, conversant and in no acute stress.  Vascular  Dorsalis pedis and posterior tibial  pulses are weakly  palpable  bilaterally.  Capillary return is within normal limits  bilaterally. Temperature is within normal limits  bilaterally.  Neurologic  Senn-Weinstein monofilament wire test within normal limits  bilaterally. Muscle power within normal limits bilaterally.  Nails Thick disfigured discolored nails with subungual debris  from hallux to fifth toes bilaterally. No evidence of bacterial infection or drainage bilaterally.  Orthopedic  No limitations of motion  feet .  No crepitus or effusions noted.  No bony pathology or digital deformities noted.  Skin  normotropic skin with no porokeratosis noted bilaterally.  No signs of infections or ulcers noted.     Onychomycosis  Pain in right toes  Pain in left toes   Consent was obtained for treatment procedures.   Mechanical debridement of nails 1-5  bilaterally performed with a nail nipper.  Filed with dremel without incident. Told him to use vaseline or O'Keffes moisturizer.     Return office visit    3 months                  Told patient to return for periodic foot care and evaluation due to potential at risk complications.   Cari Vandeberg DPM   

## 2022-09-11 ENCOUNTER — Other Ambulatory Visit: Payer: Self-pay | Admitting: Cardiology

## 2022-09-18 DIAGNOSIS — Z23 Encounter for immunization: Secondary | ICD-10-CM | POA: Diagnosis not present

## 2022-09-22 DIAGNOSIS — I4891 Unspecified atrial fibrillation: Secondary | ICD-10-CM | POA: Diagnosis not present

## 2022-09-22 DIAGNOSIS — E669 Obesity, unspecified: Secondary | ICD-10-CM | POA: Diagnosis not present

## 2022-09-22 DIAGNOSIS — K219 Gastro-esophageal reflux disease without esophagitis: Secondary | ICD-10-CM | POA: Diagnosis not present

## 2022-09-22 DIAGNOSIS — G4733 Obstructive sleep apnea (adult) (pediatric): Secondary | ICD-10-CM | POA: Diagnosis not present

## 2022-09-22 DIAGNOSIS — I10 Essential (primary) hypertension: Secondary | ICD-10-CM | POA: Diagnosis not present

## 2022-09-22 DIAGNOSIS — M5451 Vertebrogenic low back pain: Secondary | ICD-10-CM | POA: Diagnosis not present

## 2022-09-22 DIAGNOSIS — E7800 Pure hypercholesterolemia, unspecified: Secondary | ICD-10-CM | POA: Diagnosis not present

## 2022-10-01 ENCOUNTER — Other Ambulatory Visit (HOSPITAL_COMMUNITY): Payer: Self-pay | Admitting: *Deleted

## 2022-10-01 DIAGNOSIS — M546 Pain in thoracic spine: Secondary | ICD-10-CM | POA: Diagnosis not present

## 2022-10-01 DIAGNOSIS — M5416 Radiculopathy, lumbar region: Secondary | ICD-10-CM | POA: Diagnosis not present

## 2022-10-01 DIAGNOSIS — M545 Low back pain, unspecified: Secondary | ICD-10-CM | POA: Diagnosis not present

## 2022-10-01 MED ORDER — RIVAROXABAN 20 MG PO TABS: 20.0000 mg | ORAL_TABLET | Freq: Every day | ORAL | 2 refills | Status: DC

## 2022-10-08 DIAGNOSIS — M5451 Vertebrogenic low back pain: Secondary | ICD-10-CM | POA: Diagnosis not present

## 2022-10-16 DIAGNOSIS — M5451 Vertebrogenic low back pain: Secondary | ICD-10-CM | POA: Diagnosis not present

## 2022-10-20 DIAGNOSIS — M5451 Vertebrogenic low back pain: Secondary | ICD-10-CM | POA: Diagnosis not present

## 2022-10-21 DIAGNOSIS — Z125 Encounter for screening for malignant neoplasm of prostate: Secondary | ICD-10-CM | POA: Diagnosis not present

## 2022-10-29 DIAGNOSIS — M5451 Vertebrogenic low back pain: Secondary | ICD-10-CM | POA: Diagnosis not present

## 2022-10-30 DIAGNOSIS — N401 Enlarged prostate with lower urinary tract symptoms: Secondary | ICD-10-CM | POA: Diagnosis not present

## 2022-10-30 DIAGNOSIS — R3912 Poor urinary stream: Secondary | ICD-10-CM | POA: Diagnosis not present

## 2022-10-30 DIAGNOSIS — R3915 Urgency of urination: Secondary | ICD-10-CM | POA: Diagnosis not present

## 2022-11-05 DIAGNOSIS — M5451 Vertebrogenic low back pain: Secondary | ICD-10-CM | POA: Diagnosis not present

## 2022-11-08 NOTE — Progress Notes (Deleted)
Electrophysiology Office Follow up Visit Note:    Date:  11/08/2022   ID:  Christopher Hanson, DOB 17-May-1951, MRN [id number]  PCP:  [name], MD  [facility] Cardiologist:  [name], MD  [facility] Electrophysiologist:  Vickie Epley, MD    Interval History:    Christopher Hanson is a 71 y.o. male who presents for a follow up visit. They were last seen in clinic August 21, 2022 for his persistent atrial fibrillation on Tikosyn.  He presents today for follow-up.  Since I last saw him he has been doing well without recurrence of his arrhythmia.       Past Medical History:  Diagnosis Date   GERD (gastroesophageal reflux disease)    Hyperlipemia    Hypertension    Morbidly obese (HCC)    BMI 50   OSA on CPAP 03/2020   Diagnosed April-May 2021   Paroxysmal atrial fibrillation ([place]) 01/2018   Persistent paroxysmal A. fib; s/p facilitated DCCV while on Tikosyn (May 16, 2020)   Seasonal allergies    Wears glasses     Past Surgical History:  Procedure Laterality Date   CARDIOVERSION N/A 04/17/2020   Procedure: CARDIOVERSION;  Surgeon: [name], MD;  Location: [place];  Service: Cardiovascular;  Laterality: N/A;   CARDIOVERSION N/A 05/16/2020   Procedure: CARDIOVERSION;  Surgeon: [name], MD;  Location: [place];  Service: Cardiovascular;  Laterality: N/A;   CARPAL TUNNEL RELEASE     right and left   COLONOSCOPY     CYST EXCISION     face   EAR CYST EXCISION Left 02/11/2015   Procedure: EXCISION OF LEFT POST AURICULAR CYST;  Surgeon: [name], MD;  Location: [place];  Service: ENT;  Laterality: Left;   INGUINAL HERNIA REPAIR     bilat-as infant   NASAL SINUS SURGERY  2000    Current Medications: No outpatient medications have been marked as taking for the 11/09/22 encounter (Appointment) with Vickie Epley, MD.     Allergies:   Lisinopril   Social History   Socioeconomic History    Marital status: Married    Spouse name: Not on file   Number of children: Not on file   Years of education: Not on file   Highest education level: Not on file  Occupational History   Not on file  Tobacco Use   Smoking status: Never   Smokeless tobacco: Never  Vaping Use   Vaping Use: Never used  Substance and Sexual Activity   Alcohol use: Yes    Alcohol/week: 2.0 standard drinks of alcohol    Types: 1 Glasses of wine, 1 Shots of liquor per week    Comment: occ   Drug use: No   Sexual activity: Not on file  Other Topics Concern   Not on file  Social History Narrative   Not on file   Social Determinants of Health   Financial Resource Strain: Not on file  Food Insecurity: Not on file  Transportation Needs: Not on file  Physical Activity: Not on file  Stress: Not on file  Social Connections: Not on file     Family History: The patient's family history is not on file.  ROS:   Please see the history of present illness.    All other systems reviewed and are negative.  EKGs/Labs/Other Studies Reviewed:    The following studies were reviewed today:   EKG:  The ekg ordered today demonstrates ***  Recent  Labs: 04/07/2022: Hemoglobin 13.3; Platelets 182 08/21/2022: BUN 19; Creatinine, Ser 0.98; Magnesium 2.0; Potassium 4.5; Sodium 140  Recent Lipid Panel No results found for: "CHOL", "TRIG", "HDL", "CHOLHDL", "VLDL", "LDLCALC", "LDLDIRECT"  Physical Exam:    VS:  There were no vitals taken for this visit.    Wt Readings from Last 3 Encounters:  08/21/22 (!) 352 lb (159.7 kg)  04/07/22 (!) 359 lb 3.2 oz (162.9 kg)  01/27/22 (!) 378 lb (171.5 kg)     GEN: *** Well nourished, well developed in no acute distress HEENT: Normal NECK: No JVD; No carotid bruits LYMPHATICS: No lymphadenopathy CARDIAC: ***RRR, no murmurs, rubs, gallops RESPIRATORY:  Clear to auscultation without rales, wheezing or rhonchi  ABDOMEN: Soft, non-tender, non-distended MUSCULOSKELETAL:  No  edema; No deformity  SKIN: Warm and dry NEUROLOGIC:  Alert and oriented x 3 PSYCHIATRIC:  Normal affect        ASSESSMENT:    1. Persistent atrial fibrillation ([place])   2. Morbid obesity ([place])   3. Encounter for long-term (current) use of high-risk medication    PLAN:    In order of problems listed above:   #Persistent atrial fibrillation Doing well on Tikosyn. QTc is acceptable for continued Tikosyn use. Continue Xarelto for stroke prophylaxis. Blood work needed today.   Follow-up in 3 months with APP.    Medication Adjustments/Labs and Tests Ordered: Current medicines are reviewed at length with the patient today.  Concerns regarding medicines are outlined above.  No orders of the defined types were placed in this encounter.  No orders of the defined types were placed in this encounter.    Signed, Lars Mage, MD, [facility], [facility] 11/08/2022 4:29 PM    Electrophysiology [facility]

## 2022-11-09 ENCOUNTER — Ambulatory Visit: Payer: Medicare Other | Admitting: Cardiology

## 2022-11-09 DIAGNOSIS — Z79899 Other long term (current) drug therapy: Secondary | ICD-10-CM

## 2022-11-09 DIAGNOSIS — I4819 Other persistent atrial fibrillation: Secondary | ICD-10-CM

## 2022-12-03 ENCOUNTER — Other Ambulatory Visit: Payer: Self-pay | Admitting: Cardiology

## 2022-12-10 DIAGNOSIS — N401 Enlarged prostate with lower urinary tract symptoms: Secondary | ICD-10-CM | POA: Diagnosis not present

## 2022-12-10 DIAGNOSIS — R3915 Urgency of urination: Secondary | ICD-10-CM | POA: Diagnosis not present

## 2022-12-11 ENCOUNTER — Encounter: Payer: Self-pay | Admitting: Podiatry

## 2022-12-11 ENCOUNTER — Ambulatory Visit (INDEPENDENT_AMBULATORY_CARE_PROVIDER_SITE_OTHER): Payer: Medicare Other | Admitting: Podiatry

## 2022-12-11 DIAGNOSIS — M79675 Pain in left toe(s): Secondary | ICD-10-CM

## 2022-12-11 DIAGNOSIS — M79674 Pain in right toe(s): Secondary | ICD-10-CM

## 2022-12-11 DIAGNOSIS — B351 Tinea unguium: Secondary | ICD-10-CM | POA: Diagnosis not present

## 2022-12-11 DIAGNOSIS — R600 Localized edema: Secondary | ICD-10-CM

## 2022-12-11 DIAGNOSIS — D689 Coagulation defect, unspecified: Secondary | ICD-10-CM

## 2022-12-11 NOTE — Progress Notes (Signed)
This patient returns to my office for at risk foot care.  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  This patient is unable to cut nails himself since the patient cannot reach his nails.These nails are painful walking and wearing shoes. Patient also has fissures on both heels due to his callus. This patient presents for at risk foot care today.  General Appearance  Alert, conversant and in no acute stress.  Vascular  Dorsalis pedis and posterior tibial  pulses are weakly  palpable  bilaterally.  Capillary return is within normal limits  bilaterally. Temperature is within normal limits  bilaterally.  Neurologic  Senn-Weinstein monofilament wire test within normal limits  bilaterally. Muscle power within normal limits bilaterally.  Nails Thick disfigured discolored nails with subungual debris  from hallux to fifth toes bilaterally. No evidence of bacterial infection or drainage bilaterally.  Orthopedic  No limitations of motion  feet .  No crepitus or effusions noted.  No bony pathology or digital deformities noted.  Skin  normotropic skin with no porokeratosis noted bilaterally.  No signs of infections or ulcers noted.     Onychomycosis  Pain in right toes  Pain in left toes   Consent was obtained for treatment procedures.   Mechanical debridement of nails 1-5  bilaterally performed with a nail nipper.  Filed with dremel without incident. Told him to use vaseline or O'Keffes moisturizer.     Return office visit    3 months                  Told patient to return for periodic foot care and evaluation due to potential at risk complications.   Gardiner Barefoot DPM

## 2023-01-05 DIAGNOSIS — M65332 Trigger finger, left middle finger: Secondary | ICD-10-CM | POA: Diagnosis not present

## 2023-01-05 DIAGNOSIS — M65842 Other synovitis and tenosynovitis, left hand: Secondary | ICD-10-CM | POA: Diagnosis not present

## 2023-01-07 ENCOUNTER — Encounter (HOSPITAL_COMMUNITY): Payer: Self-pay | Admitting: *Deleted

## 2023-01-11 NOTE — Progress Notes (Unsigned)
Electrophysiology Office Follow up Visit Note:    Date:  01/11/2023   ID:  Christopher Hanson, DOB Sep 20, 1951, MRN [id number]  PCP:  [name], MD  [facility] Cardiologist:  [name], MD  [place] Electrophysiologist:  Vickie Epley, MD    Interval History:    Christopher Hanson is a 72 y.o. male who presents for a follow up visit. They were last seen in clinic August 21, 2022 for persistent atrial fibrillation.  The patient is maintained on Tikosyn.  He also takes Xarelto for stroke prophylaxis.       Past Medical History:  Diagnosis Date   GERD (gastroesophageal reflux disease)    Hyperlipemia    Hypertension    Morbidly obese (HCC)    BMI 50   OSA on CPAP 03/2020   Diagnosed April-May 2021   Paroxysmal atrial fibrillation ([place]) 01/2018   Persistent paroxysmal A. fib; s/p facilitated DCCV while on Tikosyn (May 16, 2020)   Seasonal allergies    Wears glasses     Past Surgical History:  Procedure Laterality Date   CARDIOVERSION N/A 04/17/2020   Procedure: CARDIOVERSION;  Surgeon: [name], MD;  Location: [place];  Service: Cardiovascular;  Laterality: N/A;   CARDIOVERSION N/A 05/16/2020   Procedure: CARDIOVERSION;  Surgeon: [name], MD;  Location: [place];  Service: Cardiovascular;  Laterality: N/A;   CARPAL TUNNEL RELEASE     right and left   COLONOSCOPY     CYST EXCISION     face   EAR CYST EXCISION Left 02/11/2015   Procedure: EXCISION OF LEFT POST AURICULAR CYST;  Surgeon: [name], MD;  Location: [place];  Service: ENT;  Laterality: Left;   INGUINAL HERNIA REPAIR     bilat-as infant   NASAL SINUS SURGERY  2000    Current Medications: No outpatient medications have been marked as taking for the 01/12/23 encounter (Appointment) with Vickie Epley, MD.     Allergies:   Lisinopril   Social History   Socioeconomic History   Marital status: Married    Spouse name: Not on file    Number of children: Not on file   Years of education: Not on file   Highest education level: Not on file  Occupational History   Not on file  Tobacco Use   Smoking status: Never   Smokeless tobacco: Never  Vaping Use   Vaping Use: Never used  Substance and Sexual Activity   Alcohol use: Yes    Alcohol/week: 2.0 standard drinks of alcohol    Types: 1 Glasses of wine, 1 Shots of liquor per week    Comment: occ   Drug use: No   Sexual activity: Not on file  Other Topics Concern   Not on file  Social History Narrative   Not on file   Social Determinants of Health   Financial Resource Strain: Not on file  Food Insecurity: Not on file  Transportation Needs: Not on file  Physical Activity: Not on file  Stress: Not on file  Social Connections: Not on file     Family History: The patient's family history is not on file.  ROS:   Please see the history of present illness.    All other systems reviewed and are negative.  EKGs/Labs/Other Studies Reviewed:    The following studies were reviewed today:   EKG:  The ekg ordered today demonstrates ***  Recent Labs: 04/07/2022: Hemoglobin 13.3; Platelets 182 08/21/2022: BUN 19; Creatinine,  Ser 0.98; Magnesium 2.0; Potassium 4.5; Sodium 140  Recent Lipid Panel No results found for: "CHOL", "TRIG", "HDL", "CHOLHDL", "VLDL", "LDLCALC", "LDLDIRECT"  Physical Exam:    VS:  There were no vitals taken for this visit.    Wt Readings from Last 3 Encounters:  08/21/22 (!) 352 lb (159.7 kg)  04/07/22 (!) 359 lb 3.2 oz (162.9 kg)  01/27/22 (!) 378 lb (171.5 kg)     GEN: *** Well nourished, well developed in no acute distress CARDIAC: ***RRR, no murmurs, rubs, gallops       ASSESSMENT:    1. Persistent atrial fibrillation ([place])   2. Morbid obesity ([place])   3. Primary hypertension   4. High risk medication use    PLAN:    In order of problems listed above:  #Persistent atrial fibrillation #Tikosyn use Doing well on  Tikosyn.  Maintaining normal rhythm.  Continue current medical therapy at current dosages.  Continue Xarelto.  Update blood work today with BMP and magnesium.  #Hypertension *** goal today.  Recommend checking blood pressures 1-2 times per week at home and recording the values.  Recommend bringing these recordings to the primary care physician.   Follow-up with APP in 4 months.    Medication Adjustments/Labs and Tests Ordered: Current medicines are reviewed at length with the patient today.  Concerns regarding medicines are outlined above.  No orders of the defined types were placed in this encounter.  No orders of the defined types were placed in this encounter.    Signed, Lars Mage, MD, [facility], [facility] 01/11/2023 4:39 PM    Electrophysiology [facility]

## 2023-01-12 ENCOUNTER — Encounter: Payer: Self-pay | Admitting: Cardiology

## 2023-01-12 ENCOUNTER — Ambulatory Visit: Payer: Medicare Other | Attending: Cardiology | Admitting: Cardiology

## 2023-01-12 VITALS — BP 140/82 | HR 54 | Ht 72.0 in | Wt 367.0 lb

## 2023-01-12 DIAGNOSIS — I4819 Other persistent atrial fibrillation: Secondary | ICD-10-CM

## 2023-01-12 DIAGNOSIS — I10 Essential (primary) hypertension: Secondary | ICD-10-CM

## 2023-01-12 DIAGNOSIS — Z79899 Other long term (current) drug therapy: Secondary | ICD-10-CM

## 2023-01-12 NOTE — Patient Instructions (Signed)
Medication Instructions:  Your physician recommends that you continue on your current medications as directed. Please refer to the Current Medication list given to you today.  *If you need a refill on your cardiac medications before your next appointment, please call your pharmacy*   Lab Work: TODAY: BMET and Mag If you have labs (blood work) drawn today and your tests are completely normal, you will receive your results only by: [place] (if you have MyChart) OR A paper copy in the mail If you have any lab test that is abnormal or we need to change your treatment, we will call you to review the results.  Follow-Up: At [facility], you and your health needs are our priority.  As part of our continuing mission to provide you with exceptional heart care, we have created designated Provider Care Teams.  These Care Teams include your primary Cardiologist (physician) and Advanced Practice Providers (APPs -  Physician Assistants and Nurse Practitioners) who all work together to provide you with the care you need, when you need it.  Your next appointment:   4 month(s)  Provider:   You will see one of the following Advanced Practice Providers on your designated Care Team:   Tommye Standard, [place]" Perry, [place], NP

## 2023-01-12 NOTE — Progress Notes (Signed)
Electrophysiology Office Follow up Visit Note:    Date:  01/12/2023   ID:  Christopher Hanson, DOB 07/27/1951, MRN [id number]  PCP:  [name], MD  [facility] Cardiologist:  [name], MD  [facility] Electrophysiologist:  Vickie Epley, MD    Interval History:    Christopher Hanson is a 72 y.o. male who presents for a follow up visit. They were last seen in clinic August 21, 2022 for persistent atrial fibrillation.  The patient is maintained on Tikosyn.  He also takes Xarelto for stroke prophylaxis.  Today, he is feeling overall well. He has been compliant with xarelto, 20 mg daily.   He complains of wrist pain and has a history of carpal tunnel. No other complaints.   He denies any palpitations, chest pain, shortness of breath, or peripheral edema. No lightheadedness, headaches, syncope, orthopnea, or PND.  Past Medical History:  Diagnosis Date   GERD (gastroesophageal reflux disease)    Hyperlipemia    Hypertension    Morbidly obese (HCC)    BMI 50   OSA on CPAP 03/2020   Diagnosed April-May 2021   Paroxysmal atrial fibrillation ([place]) 01/2018   Persistent paroxysmal A. fib; s/p facilitated DCCV while on Tikosyn (May 16, 2020)   Seasonal allergies    Wears glasses     Past Surgical History:  Procedure Laterality Date   CARDIOVERSION N/A 04/17/2020   Procedure: CARDIOVERSION;  Surgeon: [name], MD;  Location: [place];  Service: Cardiovascular;  Laterality: N/A;   CARDIOVERSION N/A 05/16/2020   Procedure: CARDIOVERSION;  Surgeon: [name], MD;  Location: [place];  Service: Cardiovascular;  Laterality: N/A;   CARPAL TUNNEL RELEASE     right and left   COLONOSCOPY     CYST EXCISION     face   EAR CYST EXCISION Left 02/11/2015   Procedure: EXCISION OF LEFT POST AURICULAR CYST;  Surgeon: [name], MD;  Location: [place];  Service: ENT;  Laterality: Left;   INGUINAL HERNIA REPAIR     bilat-as infant    NASAL SINUS SURGERY  2000    Current Medications: Current Meds  Medication Sig   azelastine (ASTELIN) 0.1 % nasal spray as needed.   calcium gluconate 500 MG tablet Take 1 tablet by mouth daily. Calcium, Magnesium, zinc and Vitamin D in it.   Cholecalciferol (VITAMIN D3 PO) Take 2,000 Units by mouth every morning.   dofetilide (TIKOSYN) 500 MCG capsule TAKE ONE CAPSULE BY MOUTH TWICE A DAY   ezetimibe (ZETIA) 10 MG tablet Take 10 mg by mouth at bedtime.    ferrous sulfate 325 (65 FE) MG tablet Take 325 mg by mouth daily with breakfast.   metoprolol succinate (TOPROL-XL) 50 MG 24 hr tablet TAKE 1 AND 1/2 TABLETS(75 MG) BY MOUTH DAILY   Multiple Vitamins-Minerals (MENS MULTIVITAMIN) TABS Take 1 tablet by mouth daily.   omeprazole (PRILOSEC) 40 MG capsule Take 40 mg by mouth daily.   potassium chloride (KLOR-CON) 10 MEQ tablet Take 1 tablet (10 mEq total) by mouth daily. Please keep scheduled appointment with cardiologist   rivaroxaban (XARELTO) 20 MG TABS tablet Take 1 tablet (20 mg total) by mouth daily with supper.   simvastatin (ZOCOR) 40 MG tablet Take 40 mg by mouth at bedtime.   tamsulosin (FLOMAX) 0.4 MG CAPS capsule Take 0.4 mg by mouth in the morning and at bedtime.    valsartan (DIOVAN) 80 MG tablet TAKE 1 TABLET(80 MG) BY MOUTH DAILY   [  DISCONTINUED] Cholecalciferol (VITAMIN D3) 50 MCG (2000 UT) TABS Take 2,000 Units by mouth daily.     Allergies:   Lisinopril   Social History   Socioeconomic History   Marital status: Married    Spouse name: Not on file   Number of children: Not on file   Years of education: Not on file   Highest education level: Not on file  Occupational History   Not on file  Tobacco Use   Smoking status: Never   Smokeless tobacco: Never  Vaping Use   Vaping Use: Never used  Substance and Sexual Activity   Alcohol use: Yes    Alcohol/week: 2.0 standard drinks of alcohol    Types: 1 Glasses of wine, 1 Shots of liquor per week    Comment: occ    Drug use: No   Sexual activity: Not on file  Other Topics Concern   Not on file  Social History Narrative   Not on file   Social Determinants of Health   Financial Resource Strain: Not on file  Food Insecurity: Not on file  Transportation Needs: Not on file  Physical Activity: Not on file  Stress: Not on file  Social Connections: Not on file     Family History: The patient's family history is not on file.  ROS:   Please see the history of present illness.    (+) wrist pain All other systems reviewed and are negative.  EKGs/Labs/Other Studies Reviewed:    The following studies were reviewed today:   EKG:  The ekg ordered today demonstrates sinus rhythm.  Heart rate 54.  QTc is 434 ms.  Recent Labs: 04/07/2022: Hemoglobin 13.3; Platelets 182 08/21/2022: BUN 19; Creatinine, Ser 0.98; Magnesium 2.0; Potassium 4.5; Sodium 140  Recent Lipid Panel No results found for: "CHOL", "TRIG", "HDL", "CHOLHDL", "VLDL", "LDLCALC", "LDLDIRECT"  Physical Exam:    VS:  BP (!) 140/82   Pulse (!) 54   Ht 6' (1.829 m)   Wt (!) 367 lb (166.5 kg)   SpO2 97%   BMI 49.77 kg/m     Wt Readings from Last 3 Encounters:  01/12/23 (!) 367 lb (166.5 kg)  08/21/22 (!) 352 lb (159.7 kg)  04/07/22 (!) 359 lb 3.2 oz (162.9 kg)     GEN:  Well nourished, well developed in no acute distress.  Obese CARDIAC: RRR, no murmurs, rubs, gallops       ASSESSMENT:    1. Persistent atrial fibrillation ([place])   2. Morbid obesity ([place])   3. Primary hypertension   4. High risk medication use    PLAN:    In order of problems listed above:  #Persistent atrial fibrillation #Tikosyn use Doing well on Tikosyn.  Maintaining normal rhythm.  Continue current medical therapy at current dosages.  Continue Xarelto.  Update blood work today with BMP and magnesium.  #Hypertension At goal today.  Recommend checking blood pressures 1-2 times per week at home and recording the values.  Recommend bringing these  recordings to the primary care physician.   Follow-up with APP in 4 months.     Medication Adjustments/Labs and Tests Ordered: Current medicines are reviewed at length with the patient today.  Concerns regarding medicines are outlined above.  No orders of the defined types were placed in this encounter.  No orders of the defined types were placed in this encounter.   I,Rachel Rivera,acting as a scribe for Vickie Epley, MD.,have documented all relevant documentation on the behalf of Zoeie Ritter  Pryor Curia, MD,as directed by  Vickie Epley, MD while in the presence of Vickie Epley, MD.  I, Vickie Epley, MD, have reviewed all documentation for this visit. The documentation on 01/12/23 for the exam, diagnosis, procedures, and orders are all accurate and complete.   Signed, Lars Mage, MD, [facility], [facility] 01/12/2023 11:25 AM    Electrophysiology [facility]

## 2023-01-13 LAB — BASIC METABOLIC PANEL
BUN/Creatinine Ratio: 15 (ref 10–24)
BUN: 14 mg/dL (ref 8–27)
CO2: 22 mmol/L (ref 20–29)
Calcium: 9.6 mg/dL (ref 8.6–10.2)
Chloride: 105 mmol/L (ref 96–106)
Creatinine, Ser: 0.91 mg/dL (ref 0.76–1.27)
Glucose: 84 mg/dL (ref 70–99)
Potassium: 4.6 mmol/L (ref 3.5–5.2)
Sodium: 142 mmol/L (ref 134–144)
eGFR: 90 mL/min/{1.73_m2} (ref >59)

## 2023-01-13 LAB — MAGNESIUM: Magnesium: 2.2 mg/dL (ref 1.6–2.3)

## 2023-02-01 ENCOUNTER — Other Ambulatory Visit: Payer: Self-pay | Admitting: Cardiology

## 2023-02-02 ENCOUNTER — Encounter: Payer: Self-pay | Admitting: Cardiology

## 2023-02-15 DIAGNOSIS — D1801 Hemangioma of skin and subcutaneous tissue: Secondary | ICD-10-CM | POA: Diagnosis not present

## 2023-02-17 ENCOUNTER — Other Ambulatory Visit: Payer: Self-pay | Admitting: Cardiology

## 2023-03-03 ENCOUNTER — Encounter: Payer: Self-pay | Admitting: Cardiology

## 2023-03-12 ENCOUNTER — Ambulatory Visit (INDEPENDENT_AMBULATORY_CARE_PROVIDER_SITE_OTHER): Payer: Medicare Other | Admitting: Podiatry

## 2023-03-12 ENCOUNTER — Encounter: Payer: Self-pay | Admitting: Podiatry

## 2023-03-12 DIAGNOSIS — D689 Coagulation defect, unspecified: Secondary | ICD-10-CM

## 2023-03-12 DIAGNOSIS — M79675 Pain in left toe(s): Secondary | ICD-10-CM | POA: Diagnosis not present

## 2023-03-12 DIAGNOSIS — M79674 Pain in right toe(s): Secondary | ICD-10-CM

## 2023-03-12 DIAGNOSIS — B351 Tinea unguium: Secondary | ICD-10-CM

## 2023-03-12 NOTE — Progress Notes (Signed)
This patient returns to my office for at risk foot care.  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  This patient is unable to cut nails himself since the patient cannot reach his nails.These nails are painful walking and wearing shoes. Patient also has fissures on both heels due to his callus. This patient presents for at risk foot care today.  General Appearance  Alert, conversant and in no acute stress.  Vascular  Dorsalis pedis and posterior tibial  pulses are weakly  palpable  bilaterally.  Capillary return is within normal limits  bilaterally. Temperature is within normal limits  bilaterally.  Neurologic  Senn-Weinstein monofilament wire test within normal limits  bilaterally. Muscle power within normal limits bilaterally.  Nails Thick disfigured discolored nails with subungual debris  from hallux to fifth toes bilaterally. No evidence of bacterial infection or drainage bilaterally.  Orthopedic  No limitations of motion  feet .  No crepitus or effusions noted.  No bony pathology or digital deformities noted.  Skin  normotropic skin with no porokeratosis noted bilaterally.  No signs of infections or ulcers noted.   Fissures improving but present.  Onychomycosis  Pain in right toes  Pain in left toes   Consent was obtained for treatment procedures.   Mechanical debridement of nails 1-5  bilaterally performed with a nail nipper.  Filed with dremel without incident. Told him to use vaseline or O'Keffes moisturizer.     Return office visit    3 months                  Told patient to return for periodic foot care and evaluation due to potential at risk complications.   Helane Gunther DPM

## 2023-03-18 DIAGNOSIS — H903 Sensorineural hearing loss, bilateral: Secondary | ICD-10-CM | POA: Diagnosis not present

## 2023-04-15 DIAGNOSIS — I10 Essential (primary) hypertension: Secondary | ICD-10-CM | POA: Diagnosis not present

## 2023-04-15 DIAGNOSIS — Z0000 Encounter for general adult medical examination without abnormal findings: Secondary | ICD-10-CM | POA: Diagnosis not present

## 2023-04-15 DIAGNOSIS — I4819 Other persistent atrial fibrillation: Secondary | ICD-10-CM | POA: Diagnosis not present

## 2023-04-15 DIAGNOSIS — Z1331 Encounter for screening for depression: Secondary | ICD-10-CM | POA: Diagnosis not present

## 2023-04-15 DIAGNOSIS — E7800 Pure hypercholesterolemia, unspecified: Secondary | ICD-10-CM | POA: Diagnosis not present

## 2023-04-15 DIAGNOSIS — E669 Obesity, unspecified: Secondary | ICD-10-CM | POA: Diagnosis not present

## 2023-04-15 DIAGNOSIS — K219 Gastro-esophageal reflux disease without esophagitis: Secondary | ICD-10-CM | POA: Diagnosis not present

## 2023-05-11 NOTE — Progress Notes (Deleted)
  Electrophysiology Office Note:   Date:  05/11/2023  ID:  Christopher Hanson, DOB 04/04/1951, MRN [id number]  Primary Cardiologist: Bryan Lemma, MD Electrophysiologist: Lanier Prude, MD   History of Present Illness:   Christopher Hanson is a 72 y.o. male with h/o persistent AF, HTN, and chronic tikosyn use seen today for routine electrophysiology followup.  Since last being seen in our clinic the patient reports doing ***.  he denies chest pain, palpitations, dyspnea, PND, orthopnea, nausea, vomiting, dizziness, syncope, edema, weight gain, or early satiety.   Review of systems complete and found to be negative unless listed in HPI.   Studies Reviewed:    EKG is ordered today. Personal review shows ***  ***   Physical Exam:   VS:  There were no vitals taken for this visit.   Wt Readings from Last 3 Encounters:  01/12/23 (!) 367 lb (166.5 kg)  08/21/22 (!) 352 lb (159.7 kg)  04/07/22 (!) 359 lb 3.2 oz (162.9 kg)     GEN: Well nourished, well developed in no acute distress NECK: No JVD; No carotid bruits CARDIAC: {EPRHYTHM:28826}, no murmurs, rubs, gallops RESPIRATORY:  Clear to auscultation without rales, wheezing or rhonchi  ABDOMEN: Soft, non-tender, non-distended EXTREMITIES:  No edema; No deformity   ASSESSMENT AND PLAN:    Persistent atrial fibrillation EKG today shows *** Continue Tikosyn *** Continue Xarelto for CHA2DS2/VASc of at least 2  HTN Stable on current regimen   {Click here to Review PMH, Prob List, Meds, Allergies, SHx, FHx  :1}   Follow up with {XBMWU:13244} {EPFOLLOW WN:02725}  Signed, Graciella Freer, PA-C

## 2023-05-13 ENCOUNTER — Ambulatory Visit: Payer: Medicare Other | Attending: Student | Admitting: Student

## 2023-05-13 ENCOUNTER — Encounter: Payer: Self-pay | Admitting: Student

## 2023-05-14 DIAGNOSIS — H2513 Age-related nuclear cataract, bilateral: Secondary | ICD-10-CM | POA: Diagnosis not present

## 2023-05-14 DIAGNOSIS — H40013 Open angle with borderline findings, low risk, bilateral: Secondary | ICD-10-CM | POA: Diagnosis not present

## 2023-05-14 DIAGNOSIS — H35033 Hypertensive retinopathy, bilateral: Secondary | ICD-10-CM | POA: Diagnosis not present

## 2023-05-14 DIAGNOSIS — H524 Presbyopia: Secondary | ICD-10-CM | POA: Diagnosis not present

## 2023-05-23 ENCOUNTER — Other Ambulatory Visit (HOSPITAL_COMMUNITY): Payer: Self-pay | Admitting: Physician Assistant

## 2023-05-24 NOTE — Telephone Encounter (Signed)
Prescription refill request for Xarelto received.  Indication: AF Last office visit: 01/12/23  Jeanie Cooks MD Weight: 166.5kg Age: 71 Scr: 0.91 on 01/12/23  Epic CrCl: 175.34  Based on above findings Xarelto 20mg  daily is the appropriate dose.  Refill approved.

## 2023-06-11 ENCOUNTER — Encounter: Payer: Self-pay | Admitting: Podiatry

## 2023-06-11 ENCOUNTER — Ambulatory Visit (INDEPENDENT_AMBULATORY_CARE_PROVIDER_SITE_OTHER): Payer: Medicare Other | Admitting: Podiatry

## 2023-06-11 DIAGNOSIS — M79674 Pain in right toe(s): Secondary | ICD-10-CM | POA: Diagnosis not present

## 2023-06-11 DIAGNOSIS — B351 Tinea unguium: Secondary | ICD-10-CM | POA: Diagnosis not present

## 2023-06-11 DIAGNOSIS — M79675 Pain in left toe(s): Secondary | ICD-10-CM | POA: Diagnosis not present

## 2023-06-11 DIAGNOSIS — R234 Changes in skin texture: Secondary | ICD-10-CM | POA: Diagnosis not present

## 2023-06-11 NOTE — Progress Notes (Signed)
This patient returns to my office for at risk foot care.  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  This patient is unable to cut nails himself since the patient cannot reach his nails.These nails are painful walking and wearing shoes. Patient also has fissures on both heels due to his callus. This patient presents for at risk foot care today.  General Appearance  Alert, conversant and in no acute stress.  Vascular  Dorsalis pedis and posterior tibial  pulses are weakly  palpable  bilaterally.  Capillary return is within normal limits  bilaterally. Temperature is within normal limits  bilaterally.  Neurologic  Senn-Weinstein monofilament wire test within normal limits  bilaterally. Muscle power within normal limits bilaterally.  Nails Thick disfigured discolored nails with subungual debris  from hallux to fifth toes bilaterally. No evidence of bacterial infection or drainage bilaterally.  Orthopedic  No limitations of motion  feet .  No crepitus or effusions noted.  No bony pathology or digital deformities noted.  Skin  normotropic skin with no porokeratosis noted bilaterally.  No signs of infections or ulcers noted.   Fissures improving but present.  Onychomycosis  Pain in right toes  Pain in left toes   Consent was obtained for treatment procedures.   Mechanical debridement of nails 1-5  bilaterally performed with a nail nipper.  Filed with dremel without incident. Told him to use vaseline or O'Keffes moisturizer. Recommend chapstick.    Return office visit    3 months                  Told patient to return for periodic foot care and evaluation due to potential at risk complications.   Helane Gunther DPM

## 2023-06-27 NOTE — Progress Notes (Deleted)
Cardiology Office Note Date:  06/27/2023  Patient ID:  Christopher Hanson 06-Sep-1951, MRN [id number] PCP:  Merri Brunette, MD  Cardiologist:  Dr.Harding Electrophysiologist: Dr. Lalla Brothers  ***refresh   Chief Complaint: *** 4 mo  History of Present Illness: Christopher Hanson is a 72 y.o. male with history of HTN, BPH, sleep apnea, obesity, AFib  He saw Dr. Lalla Brothers 01/12/23, only c/o arpal tunnel pain, maintaining SR by symptoms, maintained on Tikosyn and OAC.  *** Tikosyn EKG, meds list, teaching *** Xarelto, dose, labs, bleeding *** symptoms *** apnea? CPAP?  AFib/AAD hx Dx Feb 2021 Tikosyn started June 2021  Past Medical History:  Diagnosis Date   GERD (gastroesophageal reflux disease)    Hyperlipemia    Hypertension    Morbidly obese (HCC)    BMI 50   OSA on CPAP 03/2020   Diagnosed April-May 2021   Paroxysmal atrial fibrillation (HCC) 01/2018   Persistent paroxysmal A. fib; s/p facilitated DCCV while on Tikosyn (May 16, 2020)   Seasonal allergies    Wears glasses     Past Surgical History:  Procedure Laterality Date   CARDIOVERSION N/A 04/17/2020   Procedure: CARDIOVERSION;  Surgeon: Sande Rives, MD;  Location: [facility] ENDOSCOPY;  Service: Cardiovascular;  Laterality: N/A;   CARDIOVERSION N/A 05/16/2020   Procedure: CARDIOVERSION;  Surgeon: Little Ishikawa, MD;  Location: [facility] ENDOSCOPY;  Service: Cardiovascular;  Laterality: N/A;   CARPAL TUNNEL RELEASE     right and left   COLONOSCOPY     CYST EXCISION     face   EAR CYST EXCISION Left 02/11/2015   Procedure: EXCISION OF LEFT POST AURICULAR CYST;  Surgeon: Serena Colonel, MD;  Location: [facility];  Service: ENT;  Laterality: Left;   INGUINAL HERNIA REPAIR     bilat-as infant   NASAL SINUS SURGERY  2000    Current Outpatient Medications  Medication Sig Dispense Refill   amoxicillin (AMOXIL) 500 MG capsule Take 500 mg by mouth 3 (three) times daily.     azelastine (ASTELIN) 0.1 %  nasal spray as needed.     calcium gluconate 500 MG tablet Take 1 tablet by mouth daily. Calcium, Magnesium, zinc and Vitamin D in it.     Cholecalciferol (VITAMIN D3 PO) Take 2,000 Units by mouth every morning.     dofetilide (TIKOSYN) 500 MCG capsule TAKE ONE CAPSULE BY MOUTH TWICE A DAY 180 capsule 3   ezetimibe (ZETIA) 10 MG tablet Take 10 mg by mouth at bedtime.      ferrous sulfate 325 (65 FE) MG tablet Take 325 mg by mouth daily with breakfast.     metoprolol succinate (TOPROL-XL) 50 MG 24 hr tablet TAKE 1 AND 1/2 TABLETS(75 MG) BY MOUTH DAILY 135 tablet 1   Multiple Vitamins-Minerals (MENS MULTIVITAMIN) TABS Take 1 tablet by mouth daily.     omeprazole (PRILOSEC) 40 MG capsule Take 40 mg by mouth daily.     potassium chloride (KLOR-CON) 10 MEQ tablet TAKE 1 TABLET(10 MEQ) BY MOUTH DAILY 90 tablet 3   rivaroxaban (XARELTO) 20 MG TABS tablet TAKE 1 TABLET(20 MG) BY MOUTH DAILY WITH SUPPER 30 tablet 5   simvastatin (ZOCOR) 40 MG tablet Take 40 mg by mouth at bedtime.     tamsulosin (FLOMAX) 0.4 MG CAPS capsule Take 0.4 mg by mouth in the morning and at bedtime.      valsartan (DIOVAN) 80 MG tablet TAKE 1 TABLET(80 MG) BY MOUTH DAILY 90 tablet 1  No current facility-administered medications for this visit.    Allergies:   Lisinopril   Social History:  The patient  reports that he has never smoked. He has never used smokeless tobacco. He reports current alcohol use of about 2.0 standard drinks of alcohol per week. He reports that he does not use drugs.   Family History:  The patient's family history is not on file.  ROS:  Please see the history of present illness.    All other systems are reviewed and otherwise negative.   PHYSICAL EXAM:  VS:  There were no vitals taken for this visit. BMI: There is no height or weight on file to calculate BMI. Well nourished, well developed, in no acute distress HEENT: normocephalic, atraumatic Neck: no JVD, carotid bruits or masses Cardiac:  ***  RRR; no significant murmurs, no rubs, or gallops Lungs:  *** CTA b/l, no wheezing, rhonchi or rales Abd: soft, nontender MS: no deformity or *** atrophy Ext: *** no edema Skin: warm and dry, no rash Neuro:  No gross deficits appreciated Psych: euthymic mood, full affect   EKG:  Done today and reviewed by myself shows       02/20/2020: TTE  1. Left ventricular ejection fraction, by estimation, is 55 to 60%. The  left ventricle has normal function. The left ventricle has no regional  wall motion abnormalities. There is moderate left ventricular hypertrophy.  Left ventricular diastolic  parameters are indeterminate.   2. Right ventricular systolic function is normal. The right ventricular  size is normal. There is mildly elevated pulmonary artery systolic  pressure.   3. Left atrial size was severely dilated.   4. Right atrial size was moderately dilated.   5. The mitral valve is normal in structure. Trivial mitral valve  regurgitation. No evidence of mitral stenosis.   6. The aortic valve is tricuspid. Aortic valve regurgitation is not  visualized. No aortic stenosis is present.   7. The inferior vena cava is dilated in size with >50% respiratory  variability, suggesting right atrial pressure of 8 mmHg.    Recent Labs: 01/12/2023: BUN 14; Creatinine, Ser 0.91; Magnesium 2.2; Potassium 4.6; Sodium 142  No results found for requested labs within last 365 days.   CrCl cannot be calculated (Patient's most recent lab result is older than the maximum 21 days allowed.).   Wt Readings from Last 3 Encounters:  01/12/23 (!) 367 lb (166.5 kg)  08/21/22 (!) 352 lb (159.7 kg)  04/07/22 (!) 359 lb 3.2 oz (162.9 kg)     Other studies reviewed: Additional studies/records reviewed today include: summarized above  ASSESSMENT AND PLAN:  Persistent AFib CHA2DS2Vasc is 2, on Xarelto Tikosyn w/*** QTc *** meds reviewed *** Tikosyn teaching re-enforced *** burden by  symptoms  HTN ***  Secondary hypercoagulable state    Disposition: F/u with ***  Current medicines are reviewed at length with the patient today.  The patient did not have any concerns regarding medicines.  Norma Fredrickson, PA-C 06/27/2023 4:06 PM     [facility] [address] [place] [address] [phone] (office)  [phone] (fax)

## 2023-06-30 ENCOUNTER — Ambulatory Visit: Payer: Medicare Other | Admitting: Physician Assistant

## 2023-07-11 ENCOUNTER — Other Ambulatory Visit: Payer: Self-pay | Admitting: Cardiology

## 2023-07-27 NOTE — Progress Notes (Unsigned)
Cardiology Office Note Date:  07/29/2023  Patient ID:  Davio, Riskin Mar 31, 1951, MRN [id number] PCP:  Merri Brunette, MD  Cardiologist:  Dr.Harding Electrophysiologist: Dr. Lalla Brothers    Chief Complaint:  4 mo  History of Present Illness:  Buggy is a 72 y.o. male with history of HTN, BPH, sleep apnea, obesity, AFib  He saw Dr. Lalla Brothers 01/12/23, only c/o arpal tunnel pain, maintaining SR by symptoms, maintained on Tikosyn and OAC.  TODAY He continues to do well He was never aware of his AFib by symptoms, though his HR tended to be faster in Afib Reports HRs steadily in the 50's or so. He does not think he has had AFib since on Tikosyn.  He wonders of the Covid vaccine maybe triggered the AFib (found only a few days after his 2nd shot at a routine PMD visit)  No cardiac concerns Denies any CP, SOB, DOE No near syncope or syncope Reports good medication compliance No bleeding or signs of bleeding  AFib/AAD hx Dx Feb 2021 Tikosyn started June 2021  Past Medical History:  Diagnosis Date   GERD (gastroesophageal reflux disease)    Hyperlipemia    Hypertension    Morbidly obese (HCC)    BMI 50   OSA on CPAP 03/2020   Diagnosed April-May 2021   Paroxysmal atrial fibrillation (HCC) 01/2018   Persistent paroxysmal A. fib; s/p facilitated DCCV while on Tikosyn (May 16, 2020)   Seasonal allergies    Wears glasses     Past Surgical History:  Procedure Laterality Date   CARDIOVERSION N/A 04/17/2020   Procedure: CARDIOVERSION;  Surgeon: Sande Rives, MD;  Location: [facility] ENDOSCOPY;  Service: Cardiovascular;  Laterality: N/A;   CARDIOVERSION N/A 05/16/2020   Procedure: CARDIOVERSION;  Surgeon: Little Ishikawa, MD;  Location: [facility] ENDOSCOPY;  Service: Cardiovascular;  Laterality: N/A;   CARPAL TUNNEL RELEASE     right and left   COLONOSCOPY     CYST EXCISION     face   EAR CYST EXCISION Left 02/11/2015   Procedure: EXCISION OF LEFT POST AURICULAR  CYST;  Surgeon: Serena Colonel, MD;  Location: [facility];  Service: ENT;  Laterality: Left;   INGUINAL HERNIA REPAIR     bilat-as infant   NASAL SINUS SURGERY  2000    Current Outpatient Medications  Medication Sig Dispense Refill   acetaminophen (TYLENOL) 500 MG tablet Take 1,000 mg by mouth daily at 6 (six) AM.     azelastine (ASTELIN) 0.1 % nasal spray as needed.     calcium gluconate 500 MG tablet Take 1 tablet by mouth daily. Calcium, Magnesium, zinc and Vitamin D in it.     Cholecalciferol (VITAMIN D3 PO) Take 2,000 Units by mouth every morning.     dofetilide (TIKOSYN) 500 MCG capsule TAKE ONE CAPSULE BY MOUTH TWICE A DAY 60 capsule 0   ezetimibe (ZETIA) 10 MG tablet Take 10 mg by mouth at bedtime.      ferrous sulfate 325 (65 FE) MG tablet Take 325 mg by mouth daily with breakfast.     metoprolol succinate (TOPROL-XL) 50 MG 24 hr tablet TAKE 1 AND 1/2 TABLETS(75 MG) BY MOUTH DAILY 135 tablet 1   Multiple Vitamins-Minerals (MENS MULTIVITAMIN) TABS Take 1 tablet by mouth daily.     omeprazole (PRILOSEC) 40 MG capsule Take 40 mg by mouth 4 (four) times a week.     potassium chloride (KLOR-CON) 10 MEQ tablet TAKE 1 TABLET(10 MEQ) BY MOUTH  DAILY 90 tablet 3   rivaroxaban (XARELTO) 20 MG TABS tablet TAKE 1 TABLET(20 MG) BY MOUTH DAILY WITH SUPPER 30 tablet 5   simvastatin (ZOCOR) 40 MG tablet Take 40 mg by mouth at bedtime.     tamsulosin (FLOMAX) 0.4 MG CAPS capsule Take 0.4 mg by mouth in the morning and at bedtime.      valsartan (DIOVAN) 80 MG tablet TAKE 1 TABLET(80 MG) BY MOUTH DAILY 90 tablet 1   No current facility-administered medications for this visit.    Allergies:   Lisinopril   Social History:  The patient  reports that he has never smoked. He has never used smokeless tobacco. He reports current alcohol use of about 2.0 standard drinks of alcohol per week. He reports that he does not use drugs.   Family History:  The patient's family history is not on  file.  ROS:  Please see the history of present illness.    All other systems are reviewed and otherwise negative.   PHYSICAL EXAM:  VS:  BP 132/78   Pulse (!) 52   Ht 6' (1.829 m)   Wt (!) 383 lb (173.7 kg)   SpO2 94%   BMI 51.94 kg/m  BMI: Body mass index is 51.94 kg/m. Well nourished, well developed, in no acute distress HEENT: normocephalic, atraumatic Neck: no JVD, carotid bruits or masses Cardiac:   RRR; no significant murmurs, no rubs, or gallops Lungs:   CTA b/l, no wheezing, rhonchi or rales Abd: soft, nontender, obese MS: no deformity or atrophy Ext: no edema Skin: warm and dry, no rash Neuro:  No gross deficits appreciated Psych: euthymic mood, full affect   EKG:  Done today and reviewed by myself shows  SB 52, 1st degree AVblock , QTc   02/20/2020: TTE  1. Left ventricular ejection fraction, by estimation, is 55 to 60%. The  left ventricle has normal function. The left ventricle has no regional  wall motion abnormalities. There is moderate left ventricular hypertrophy.  Left ventricular diastolic  parameters are indeterminate.   2. Right ventricular systolic function is normal. The right ventricular  size is normal. There is mildly elevated pulmonary artery systolic  pressure.   3. Left atrial size was severely dilated.   4. Right atrial size was moderately dilated.   5. The mitral valve is normal in structure. Trivial mitral valve  regurgitation. No evidence of mitral stenosis.   6. The aortic valve is tricuspid. Aortic valve regurgitation is not  visualized. No aortic stenosis is present.   7. The inferior vena cava is dilated in size with >50% respiratory  variability, suggesting right atrial pressure of 8 mmHg.    Recent Labs: 01/12/2023: BUN 14; Creatinine, Ser 0.91; Magnesium 2.2; Potassium 4.6; Sodium 142  No results found for requested labs within last 365 days.   CrCl cannot be calculated (Patient's most recent lab result is older  than the maximum 21 days allowed.).   Wt Readings from Last 3 Encounters:  07/29/23 (!) 383 lb (173.7 kg)  01/12/23 (!) 367 lb (166.5 kg)  08/21/22 (!) 352 lb (159.7 kg)     Other studies reviewed: Additional studies/records reviewed today include: summarized above  ASSESSMENT AND PLAN:  Persistent AFib CHA2DS2Vasc is 2, on Xarelto Tikosyn w/stable QTc meds reviewed No/low burden by symptoms/home HRs Labs today  HTN Looks good   Secondary hypercoagulable state    Disposition: F/u with [place]   Current medicines are reviewed at length with the patient  today.  The patient did not have any concerns regarding medicines.  Norma Fredrickson, PA-C 07/29/2023 11:12 AM     [facility] [address] [place] [address] [phone] (office)  [phone] (fax)

## 2023-07-29 ENCOUNTER — Encounter: Payer: Self-pay | Admitting: Physician Assistant

## 2023-07-29 ENCOUNTER — Ambulatory Visit: Payer: Medicare Other | Attending: Physician Assistant | Admitting: Physician Assistant

## 2023-07-29 VITALS — BP 132/78 | HR 52 | Ht 72.0 in | Wt 383.0 lb

## 2023-07-29 DIAGNOSIS — Z5181 Encounter for therapeutic drug level monitoring: Secondary | ICD-10-CM | POA: Diagnosis not present

## 2023-07-29 DIAGNOSIS — I4819 Other persistent atrial fibrillation: Secondary | ICD-10-CM | POA: Diagnosis not present

## 2023-07-29 DIAGNOSIS — Z79899 Other long term (current) drug therapy: Secondary | ICD-10-CM | POA: Diagnosis not present

## 2023-07-29 DIAGNOSIS — I10 Essential (primary) hypertension: Secondary | ICD-10-CM | POA: Insufficient documentation

## 2023-07-29 DIAGNOSIS — D6869 Other thrombophilia: Secondary | ICD-10-CM

## 2023-07-29 MED ORDER — RIVAROXABAN 20 MG PO TABS: 20.0000 mg | ORAL_TABLET | Freq: Every day | ORAL | 3 refills | Status: DC

## 2023-07-29 MED ORDER — VALSARTAN 80 MG PO TABS: 80.0000 mg | ORAL_TABLET | Freq: Every day | ORAL | 3 refills | Status: DC

## 2023-07-29 MED ORDER — DOFETILIDE 500 MCG PO CAPS: 500.0000 ug | ORAL_CAPSULE | Freq: Two times a day (BID) | ORAL | 3 refills | Status: DC

## 2023-07-29 NOTE — Patient Instructions (Signed)
Medication Instructions:   Your physician recommends that you continue on your current medications as directed. Please refer to the Current Medication list given to you today.   *If you need a refill on your cardiac medications before your next appointment, please call your pharmacy*   Lab Work: BMET MAG AND CBC TODAY   If you have labs (blood work) drawn today and your tests are completely normal, you will receive your results only by: MyChart Message (if you have MyChart) OR A paper copy in the mail If you have any lab test that is abnormal or we need to change your treatment, we will call you to review the results.   Testing/Procedures: NONE ORDERED  TODAY     Follow-Up: At [facility], you and your health needs are our priority.  As part of our continuing mission to provide you with exceptional heart care, we have created designated Provider Care Teams.  These Care Teams include your primary Cardiologist (physician) and Advanced Practice Providers (APPs -  Physician Assistants and Nurse Practitioners) who all work together to provide you with the care you need, when you need it.  We recommend signing up for the patient portal called "MyChart".  Sign up information is provided on this After Visit Summary.  MyChart is used to connect with patients for Virtual Visits (Telemedicine).  Patients are able to view lab/test results, encounter notes, upcoming appointments, etc.  Non-urgent messages can be sent to your provider as well.   To learn more about what you can do with MyChart, go to [email or web address].    Your next appointment:   4 month(s) ( CONTACT  EP SCHEDULERS  FOR  SCHEDULING ISSUES )  Provider:   You may see Lanier Prude, MD or one of the following Advanced Practice Providers on your designated Care Team:   Francis Dowse, [place] Other Instructions

## 2023-07-30 LAB — BASIC METABOLIC PANEL
BUN/Creatinine Ratio: 16 (ref 10–24)
BUN: 17 mg/dL (ref 8–27)
CO2: 25 mmol/L (ref 20–29)
Calcium: 9.4 mg/dL (ref 8.6–10.2)
Chloride: 106 mmol/L (ref 96–106)
Creatinine, Ser: 1.04 mg/dL (ref 0.76–1.27)
Glucose: 94 mg/dL (ref 70–99)
Potassium: 4.9 mmol/L (ref 3.5–5.2)
Sodium: 142 mmol/L (ref 134–144)
eGFR: 77 mL/min/{1.73_m2} (ref >59)

## 2023-07-30 LAB — MAGNESIUM: Magnesium: 2.2 mg/dL (ref 1.6–2.3)

## 2023-07-30 LAB — CBC
Hematocrit: 39.5 % (ref 37.5–51.0)
Hemoglobin: 13.1 g/dL (ref 13.0–17.7)
MCH: 29.1 pg (ref 26.6–33.0)
MCHC: 33.2 g/dL (ref 31.5–35.7)
MCV: 88 fL (ref 79–97)
Platelets: 177 10*3/uL (ref 150–450)
RBC: 4.5 x10E6/uL (ref 4.14–5.80)
RDW: 13.3 % (ref 11.6–15.4)
WBC: 5.2 10*3/uL (ref 3.4–10.8)

## 2023-08-14 ENCOUNTER — Other Ambulatory Visit: Payer: Self-pay | Admitting: Cardiology

## 2023-08-17 DIAGNOSIS — G4733 Obstructive sleep apnea (adult) (pediatric): Secondary | ICD-10-CM | POA: Diagnosis not present

## 2023-08-30 ENCOUNTER — Other Ambulatory Visit: Payer: Self-pay

## 2023-08-30 ENCOUNTER — Other Ambulatory Visit (HOSPITAL_COMMUNITY): Payer: Self-pay | Admitting: *Deleted

## 2023-08-30 MED ORDER — RIVAROXABAN 20 MG PO TABS: 20.0000 mg | ORAL_TABLET | Freq: Every day | ORAL | 3 refills | Status: DC

## 2023-09-23 ENCOUNTER — Ambulatory Visit (INDEPENDENT_AMBULATORY_CARE_PROVIDER_SITE_OTHER): Payer: Medicare Other | Admitting: Podiatry

## 2023-09-23 ENCOUNTER — Encounter: Payer: Self-pay | Admitting: Podiatry

## 2023-09-23 VITALS — Ht 72.0 in | Wt 383.0 lb

## 2023-09-23 DIAGNOSIS — M79675 Pain in left toe(s): Secondary | ICD-10-CM | POA: Diagnosis not present

## 2023-09-23 DIAGNOSIS — D689 Coagulation defect, unspecified: Secondary | ICD-10-CM | POA: Diagnosis not present

## 2023-09-23 DIAGNOSIS — B351 Tinea unguium: Secondary | ICD-10-CM | POA: Diagnosis not present

## 2023-09-23 DIAGNOSIS — M79674 Pain in right toe(s): Secondary | ICD-10-CM | POA: Diagnosis not present

## 2023-09-23 NOTE — Progress Notes (Signed)
This patient returns to my office for at risk foot care.  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  This patient is unable to cut nails himself since the patient cannot reach his nails.These nails are painful walking and wearing shoes. Patient also has fissures on both heels due to his callus. This patient presents for at risk foot care today.  General Appearance  Alert, conversant and in no acute stress.  Vascular  Dorsalis pedis and posterior tibial  pulses are weakly  palpable  bilaterally.  Capillary return is within normal limits  bilaterally. Temperature is within normal limits  bilaterally.  Neurologic  Senn-Weinstein monofilament wire test within normal limits  bilaterally. Muscle power within normal limits bilaterally.  Nails Thick disfigured discolored nails with subungual debris  from hallux to fifth toes bilaterally. No evidence of bacterial infection or drainage bilaterally.  Orthopedic  No limitations of motion  feet .  No crepitus or effusions noted.  No bony pathology or digital deformities noted.  Skin  normotropic skin with no porokeratosis noted bilaterally.  No signs of infections or ulcers noted.   Fissures improving but present.  Onychomycosis  Pain in right toes  Pain in left toes   Consent was obtained for treatment procedures.   Mechanical debridement of nails 1-5  bilaterally performed with a nail nipper.  Filed with dremel without incident.    Return office visit    3 months                  Told patient to return for periodic foot care and evaluation due to potential at risk complications.   Helane Gunther DPM

## 2023-10-21 DIAGNOSIS — Z23 Encounter for immunization: Secondary | ICD-10-CM | POA: Diagnosis not present

## 2023-10-21 DIAGNOSIS — G4733 Obstructive sleep apnea (adult) (pediatric): Secondary | ICD-10-CM | POA: Diagnosis not present

## 2023-10-21 DIAGNOSIS — N400 Enlarged prostate without lower urinary tract symptoms: Secondary | ICD-10-CM | POA: Diagnosis not present

## 2023-10-21 DIAGNOSIS — K219 Gastro-esophageal reflux disease without esophagitis: Secondary | ICD-10-CM | POA: Diagnosis not present

## 2023-10-21 DIAGNOSIS — I480 Paroxysmal atrial fibrillation: Secondary | ICD-10-CM | POA: Diagnosis not present

## 2023-10-21 DIAGNOSIS — E7800 Pure hypercholesterolemia, unspecified: Secondary | ICD-10-CM | POA: Diagnosis not present

## 2023-10-21 DIAGNOSIS — Z125 Encounter for screening for malignant neoplasm of prostate: Secondary | ICD-10-CM | POA: Diagnosis not present

## 2023-10-21 DIAGNOSIS — M545 Low back pain, unspecified: Secondary | ICD-10-CM | POA: Diagnosis not present

## 2023-10-21 DIAGNOSIS — I10 Essential (primary) hypertension: Secondary | ICD-10-CM | POA: Diagnosis not present

## 2023-11-04 DIAGNOSIS — R3915 Urgency of urination: Secondary | ICD-10-CM | POA: Diagnosis not present

## 2023-11-04 DIAGNOSIS — N401 Enlarged prostate with lower urinary tract symptoms: Secondary | ICD-10-CM | POA: Diagnosis not present

## 2023-11-04 DIAGNOSIS — R3912 Poor urinary stream: Secondary | ICD-10-CM | POA: Diagnosis not present

## 2023-11-22 ENCOUNTER — Other Ambulatory Visit: Payer: Self-pay | Admitting: Family Medicine

## 2023-11-22 DIAGNOSIS — Z006 Encounter for examination for normal comparison and control in clinical research program: Secondary | ICD-10-CM

## 2023-12-03 ENCOUNTER — Ambulatory Visit: Payer: Medicare Other | Admitting: Cardiology

## 2023-12-06 NOTE — Progress Notes (Signed)
 Cardiology Office Note Date:  12/06/2023  Patient ID:  Christopher Hanson, Christopher Hanson 1951/10/19, MRN [id number] PCP:  [name], MD  Cardiologist:  Dr.Harding Electrophysiologist: Dr. Cindie    Chief Complaint:  4 mo  History of Present Illness: Christopher Hanson is a 73 y.o. male with history of HTN, BPH, sleep apnea, obesity, AFib  He saw Dr. Cindie 01/12/23, only c/o carpal tunnel pain, maintaining SR by symptoms, maintained on Tikosyn  and OAC.  I saw him Aug 2024 He continues to do well He was never aware of his AFib by symptoms, though his HR tended to be faster in Afib Reports HRs steadily in the 50's or so. He does not think he has had AFib since on Tikosyn . He wonders of the Covid vaccine maybe triggered the AFib (found only a few days after his 2nd shot at a routine PMD visit) No cardiac concerns Denies any CP, SOB, DOE No near syncope or syncope Reports good medication compliance No bleeding or signs of bleeding QTc stable, planned for labs and 4 mo visit  TODAY He feels well Thinks maybe 6 mo ago he had about 7 hours of Afib, none since He will occasionally miss an AM dose of Tikosyn  (he thinks), otherwise reports good medication compliance No CP, SOB No dizziness, near syncope or syncope Ruptured R bicep and has not been to the gym in a while Is looking to participate in another weight loss trial   AFib/AAD hx Dx Feb 2021 Tikosyn  started June 2021  Past Medical History:  Diagnosis Date   GERD (gastroesophageal reflux disease)    Hyperlipemia    Hypertension    Morbidly obese (HCC)    BMI 50   OSA on CPAP 03/2020   Diagnosed April-May 2021   Paroxysmal atrial fibrillation (HCC) 01/2018   Persistent paroxysmal A. fib; s/p facilitated DCCV while on Tikosyn  (May 16, 2020)   Seasonal allergies    Wears glasses     Past Surgical History:  Procedure Laterality Date   CARDIOVERSION N/A 04/17/2020   Procedure: CARDIOVERSION;  Surgeon: Barbaraann Darryle Ned,  MD;  Location: [facility] ENDOSCOPY;  Service: Cardiovascular;  Laterality: N/A;   CARDIOVERSION N/A 05/16/2020   Procedure: CARDIOVERSION;  Surgeon: Kate Lonni CROME, MD;  Location: [facility] ENDOSCOPY;  Service: Cardiovascular;  Laterality: N/A;   CARPAL TUNNEL RELEASE     right and left   COLONOSCOPY     CYST EXCISION     face   EAR CYST EXCISION Left 02/11/2015   Procedure: EXCISION OF LEFT POST AURICULAR CYST;  Surgeon: Ida Loader, MD;  Location: [facility];  Service: ENT;  Laterality: Left;   INGUINAL HERNIA REPAIR     bilat-as infant   NASAL SINUS SURGERY  2000    Current Outpatient Medications  Medication Sig Dispense Refill   acetaminophen  (TYLENOL ) 500 MG tablet Take 1,000 mg by mouth daily at 6 (six) AM.     azelastine (ASTELIN) 0.1 % nasal spray as needed.     calcium gluconate 500 MG tablet Take 1 tablet by mouth daily. Calcium, Magnesium , zinc  and Vitamin D in it.     Cholecalciferol (VITAMIN D3 PO) Take 2,000 Units by mouth every morning.     dofetilide  (TIKOSYN ) 500 MCG capsule Take 1 capsule (500 mcg total) by mouth 2 (two) times daily. 90 capsule 7   ezetimibe  (ZETIA ) 10 MG tablet Take 10 mg by mouth at bedtime.      ferrous sulfate 325 (65 FE)  MG tablet Take 325 mg by mouth daily with breakfast.     metoprolol  succinate (TOPROL -XL) 50 MG 24 hr tablet TAKE 1 AND 1/2 TABLETS(75 MG) BY MOUTH DAILY 135 tablet 1   Multiple Vitamins-Minerals (MENS MULTIVITAMIN) TABS Take 1 tablet by mouth daily.     omeprazole (PRILOSEC) 40 MG capsule Take 40 mg by mouth 4 (four) times a week.     potassium chloride  (KLOR-CON ) 10 MEQ tablet TAKE 1 TABLET(10 MEQ) BY MOUTH DAILY 90 tablet 3   rivaroxaban  (XARELTO ) 20 MG TABS tablet Take 1 tablet (20 mg total) by mouth daily with supper. 90 tablet 3   simvastatin  (ZOCOR ) 40 MG tablet Take 40 mg by mouth at bedtime.     tamsulosin  (FLOMAX ) 0.4 MG CAPS capsule Take 0.4 mg by mouth in the morning and at bedtime.      valsartan  (DIOVAN ) 80  MG tablet Take 1 tablet (80 mg total) by mouth daily. 90 tablet 3   No current facility-administered medications for this visit.    Allergies:   Lisinopril    Social History:  The patient  reports that he has never smoked. He has never used smokeless tobacco. He reports current alcohol use of about 2.0 standard drinks of alcohol per week. He reports that he does not use drugs.   Family History:  The patient's family history is not on file.  ROS:  Please see the history of present illness.    All other systems are reviewed and otherwise negative.   PHYSICAL EXAM:  VS:  There were no vitals taken for this visit. BMI: There is no height or weight on file to calculate BMI. Well nourished, well developed, in no acute distress HEENT: normocephalic, atraumatic Neck: no JVD, carotid bruits or masses Cardiac:  RRR; no significant murmurs, no rubs, or gallops Lungs: CTA b/l, no wheezing, rhonchi or rales Abd: soft, nontender, obese MS: no deformity or atrophy Ext: trace-1+ edema (reported as chrionic Skin: warm and dry, no rash Neuro:  No gross deficits appreciated Psych: euthymic mood, full affect   EKG:  Done today and reviewed by myself shows  SR 61bpm, QTc  07/29/23: SB 52, 1st degree AVblock , QTc   02/20/2020: TTE  1. Left ventricular ejection fraction, by estimation, is 55 to 60%. The  left ventricle has normal function. The left ventricle has no regional  wall motion abnormalities. There is moderate left ventricular hypertrophy.  Left ventricular diastolic  parameters are indeterminate.   2. Right ventricular systolic function is normal. The right ventricular  size is normal. There is mildly elevated pulmonary artery systolic  pressure.   3. Left atrial size was severely dilated.   4. Right atrial size was moderately dilated.   5. The mitral valve is normal in structure. Trivial mitral valve  regurgitation. No evidence of mitral stenosis.   6. The aortic  valve is tricuspid. Aortic valve regurgitation is not  visualized. No aortic stenosis is present.   7. The inferior vena cava is dilated in size with >50% respiratory  variability, suggesting right atrial pressure of 8 mmHg.    Recent Labs: 07/29/2023: BUN 17; Creatinine, Ser 1.04; Hemoglobin 13.1; Magnesium  2.2; Platelets 177; Potassium 4.9; Sodium 142  No results found for requested labs within last 365 days.   CrCl cannot be calculated (Patient's most recent lab result is older than the maximum 21 days allowed.).   Wt Readings from Last 3 Encounters:  09/23/23 (!) 383 lb (173.7 kg)  07/29/23 (!) 383 lb (173.7 kg)  01/12/23 (!) 367 lb (166.5 kg)     Other studies reviewed: Additional studies/records reviewed today include: summarized above  ASSESSMENT AND PLAN:  Persistent AFib CHA2DS2Vasc is 2, on Xarelto  Tikosyn  w/stable QTc meds reviewed No/low burden by symptoms/home HRs Labs today  Tikosyn  teaching re-enforced, he has alarms/reminders for his tikosyn /meds Dsicussed importance for new meds to be cleared by pharmasist/us . Make sure trial team is aware of his Tikosyn  and clearly discuss that it is ok with tikosyn    HTN Looks good   Secondary hypercoagulable state  Encouraged back to the gym/exercise that won't hurt his shoulder/arm Elevate feet, support stockings    Disposition:  F/u with us  again in 62mo, sooner if needed  Current medicines are reviewed at length with the patient today.  The patient did not have any concerns regarding medicines.  Bonney Charlies Arthur, PA-C 12/06/2023 8:36 AM     [facility] [address] [place] [address] [phone] (office)  [phone] (fax)

## 2023-12-07 DIAGNOSIS — N401 Enlarged prostate with lower urinary tract symptoms: Secondary | ICD-10-CM | POA: Diagnosis not present

## 2023-12-07 DIAGNOSIS — R3912 Poor urinary stream: Secondary | ICD-10-CM | POA: Diagnosis not present

## 2023-12-07 DIAGNOSIS — R3915 Urgency of urination: Secondary | ICD-10-CM | POA: Diagnosis not present

## 2023-12-09 ENCOUNTER — Ambulatory Visit: Payer: Medicare Other | Attending: Physician Assistant | Admitting: Physician Assistant

## 2023-12-09 ENCOUNTER — Encounter: Payer: Self-pay | Admitting: Physician Assistant

## 2023-12-09 VITALS — BP 126/80 | HR 61 | Ht 72.0 in | Wt 386.0 lb

## 2023-12-09 DIAGNOSIS — D6869 Other thrombophilia: Secondary | ICD-10-CM | POA: Diagnosis not present

## 2023-12-09 DIAGNOSIS — Z79899 Other long term (current) drug therapy: Secondary | ICD-10-CM | POA: Insufficient documentation

## 2023-12-09 DIAGNOSIS — I10 Essential (primary) hypertension: Secondary | ICD-10-CM | POA: Diagnosis not present

## 2023-12-09 DIAGNOSIS — Z5181 Encounter for therapeutic drug level monitoring: Secondary | ICD-10-CM | POA: Diagnosis not present

## 2023-12-09 DIAGNOSIS — I4819 Other persistent atrial fibrillation: Secondary | ICD-10-CM | POA: Insufficient documentation

## 2023-12-09 LAB — BASIC METABOLIC PANEL
BUN/Creatinine Ratio: 14 (ref 10–24)
BUN: 16 mg/dL (ref 8–27)
CO2: 24 mmol/L (ref 20–29)
Calcium: 9.6 mg/dL (ref 8.6–10.2)
Chloride: 103 mmol/L (ref 96–106)
Creatinine, Ser: 1.13 mg/dL (ref 0.76–1.27)
Glucose: 101 mg/dL — ABNORMAL HIGH (ref 70–99)
Potassium: 4.6 mmol/L (ref 3.5–5.2)
Sodium: 142 mmol/L (ref 134–144)
eGFR: 69 mL/min/{1.73_m2} (ref >59)

## 2023-12-09 LAB — MAGNESIUM: Magnesium: 2.2 mg/dL (ref 1.6–2.3)

## 2023-12-09 NOTE — Patient Instructions (Addendum)
 Medication Instructions:   Your physician recommends that you continue on your current medications as directed. Please refer to the Current Medication list given to you today.   *If you need a refill on your cardiac medications before your next appointment, please call your pharmacy*   Lab Work:  PLEASE GO DOWN STAIRS FIRST FLOOR  [address] :  BMET AND MAG TODAY      If you have labs (blood work) drawn today and your tests are completely normal, you will receive your results only by: MyChart Message (if you have MyChart) OR A paper copy in the mail If you have any lab test that is abnormal or we need to change your treatment, we will call you to review the results.   Testing/Procedures: NONE ORDERED  TODAY      Follow-Up: At [facility], you and your health needs are our priority.  As part of our continuing mission to provide you with exceptional heart care, we have created designated Provider Care Teams.  These Care Teams include your primary Cardiologist (physician) and Advanced Practice Providers (APPs -  Physician Assistants and Nurse Practitioners) who all work together to provide you with the care you need, when you need it.  We recommend signing up for the patient portal called MyChart.  Sign up information is provided on this After Visit Summary.  MyChart is used to connect with patients for Virtual Visits (Telemedicine).  Patients are able to view lab/test results, encounter notes, upcoming appointments, etc.  Non-urgent messages can be sent to your provider as well.   To learn more about what you can do with MyChart, go to [email or web address].    Your next appointment:    4 month(s)  ( CONTACT  CASSIE HALL/ ANGELINE HAMMER FOR EP SCHEDULING ISSUES )    Provider:    You may see OLE ONEIDA HOLTS, MD or one of the following Advanced Practice Providers on your designated Care Team:   Charlies Arthur, [place] Ozell Jodie Passey, [place]  Daphne Barrack, NP     Other Instructions

## 2023-12-15 ENCOUNTER — Other Ambulatory Visit: Payer: Self-pay | Admitting: Family Medicine

## 2023-12-15 DIAGNOSIS — Z006 Encounter for examination for normal comparison and control in clinical research program: Secondary | ICD-10-CM

## 2023-12-16 DIAGNOSIS — R3912 Poor urinary stream: Secondary | ICD-10-CM | POA: Diagnosis not present

## 2023-12-16 DIAGNOSIS — R3915 Urgency of urination: Secondary | ICD-10-CM | POA: Diagnosis not present

## 2023-12-16 DIAGNOSIS — R351 Nocturia: Secondary | ICD-10-CM | POA: Diagnosis not present

## 2023-12-16 DIAGNOSIS — N401 Enlarged prostate with lower urinary tract symptoms: Secondary | ICD-10-CM | POA: Diagnosis not present

## 2023-12-24 ENCOUNTER — Ambulatory Visit (INDEPENDENT_AMBULATORY_CARE_PROVIDER_SITE_OTHER): Payer: Medicare Other | Admitting: Podiatry

## 2023-12-24 ENCOUNTER — Encounter: Payer: Self-pay | Admitting: Podiatry

## 2023-12-24 DIAGNOSIS — B351 Tinea unguium: Secondary | ICD-10-CM | POA: Diagnosis not present

## 2023-12-24 DIAGNOSIS — M79674 Pain in right toe(s): Secondary | ICD-10-CM

## 2023-12-24 DIAGNOSIS — D689 Coagulation defect, unspecified: Secondary | ICD-10-CM | POA: Diagnosis not present

## 2023-12-24 DIAGNOSIS — M79675 Pain in left toe(s): Secondary | ICD-10-CM | POA: Diagnosis not present

## 2023-12-24 NOTE — Progress Notes (Signed)
This patient returns to my office for at risk foot care.  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  This patient is unable to cut nails himself since the patient cannot reach his nails.These nails are painful walking and wearing shoes. Patient also has fissures on both heels due to his callus. This patient presents for at risk foot care today.  General Appearance  Alert, conversant and in no acute stress.  Vascular  Dorsalis pedis and posterior tibial  pulses are weakly  palpable  bilaterally.  Capillary return is within normal limits  bilaterally. Temperature is within normal limits  bilaterally.  Neurologic  Senn-Weinstein monofilament wire test within normal limits  bilaterally. Muscle power within normal limits bilaterally.  Nails Thick disfigured discolored nails with subungual debris  from hallux to fifth toes bilaterally. No evidence of bacterial infection or drainage bilaterally.  Orthopedic  No limitations of motion  feet .  No crepitus or effusions noted.  No bony pathology or digital deformities noted.  Skin  normotropic skin with no porokeratosis noted bilaterally.  No signs of infections or ulcers noted.   Fissures improving but present.  Onychomycosis  Pain in right toes  Pain in left toes   Consent was obtained for treatment procedures.   Mechanical debridement of nails 1-5  bilaterally performed with a nail nipper.  Filed with dremel without incident.    Return office visit    3 months                  Told patient to return for periodic foot care and evaluation due to potential at risk complications.   Helane Gunther DPM

## 2023-12-27 DIAGNOSIS — R051 Acute cough: Secondary | ICD-10-CM | POA: Diagnosis not present

## 2023-12-27 DIAGNOSIS — R0981 Nasal congestion: Secondary | ICD-10-CM | POA: Diagnosis not present

## 2023-12-27 DIAGNOSIS — U071 COVID-19: Secondary | ICD-10-CM | POA: Diagnosis not present

## 2024-01-08 ENCOUNTER — Other Ambulatory Visit: Payer: Medicare Other

## 2024-01-13 ENCOUNTER — Other Ambulatory Visit: Payer: Medicare Other

## 2024-01-16 ENCOUNTER — Other Ambulatory Visit: Payer: Self-pay | Admitting: Cardiology

## 2024-01-20 ENCOUNTER — Encounter: Payer: Self-pay | Admitting: Cardiology

## 2024-02-13 ENCOUNTER — Other Ambulatory Visit: Payer: Self-pay | Admitting: Cardiology

## 2024-03-02 ENCOUNTER — Other Ambulatory Visit: Payer: Self-pay | Admitting: Family Medicine

## 2024-03-02 ENCOUNTER — Ambulatory Visit
Admission: RE | Admit: 2024-03-02 | Discharge: 2024-03-02 | Disposition: A | Discharge status: Home / Self Care | Source: Ambulatory Visit | Attending: Family Medicine | Admitting: Family Medicine

## 2024-03-02 ENCOUNTER — Encounter: Payer: Self-pay | Admitting: Family Medicine

## 2024-03-02 DIAGNOSIS — R109 Unspecified abdominal pain: Secondary | ICD-10-CM

## 2024-03-03 ENCOUNTER — Encounter: Payer: Self-pay | Admitting: Family Medicine

## 2024-03-03 DIAGNOSIS — R109 Unspecified abdominal pain: Secondary | ICD-10-CM

## 2024-03-23 ENCOUNTER — Ambulatory Visit (INDEPENDENT_AMBULATORY_CARE_PROVIDER_SITE_OTHER): Payer: Medicare Other | Admitting: Podiatry

## 2024-03-23 ENCOUNTER — Encounter: Payer: Self-pay | Admitting: Podiatry

## 2024-03-23 DIAGNOSIS — R600 Localized edema: Secondary | ICD-10-CM

## 2024-03-23 DIAGNOSIS — D689 Coagulation defect, unspecified: Secondary | ICD-10-CM | POA: Diagnosis not present

## 2024-03-23 DIAGNOSIS — M79675 Pain in left toe(s): Secondary | ICD-10-CM | POA: Diagnosis not present

## 2024-03-23 DIAGNOSIS — M79674 Pain in right toe(s): Secondary | ICD-10-CM | POA: Diagnosis not present

## 2024-03-23 DIAGNOSIS — B351 Tinea unguium: Secondary | ICD-10-CM | POA: Diagnosis not present

## 2024-03-23 NOTE — Progress Notes (Signed)
 This patient returns to my office for at risk foot care.  This patient requires this care by a professional since this patient will be at risk due to having coagulation defect due to taking xarelto .  This patient is unable to cut nails himself since the patient cannot reach his nails.These nails are painful walking and wearing shoes. Patient also has fissures on both heels due to his callus. This patient presents for at risk foot care today.  General Appearance  Alert, conversant and in no acute stress.  Vascular  Dorsalis pedis and posterior tibial  pulses are weakly  palpable  bilaterally.  Capillary return is within normal limits  bilaterally. Temperature is within normal limits  bilaterally.  Neurologic  Senn-Weinstein monofilament wire test within normal limits  bilaterally. Muscle power within normal limits bilaterally.  Nails Thick disfigured discolored nails with subungual debris  from hallux to fifth toes bilaterally. No evidence of bacterial infection or drainage bilaterally.  Orthopedic  No limitations of motion  feet .  No crepitus or effusions noted.  No bony pathology or digital deformities noted.  Skin  normotropic skin with no porokeratosis noted bilaterally.  No signs of infections or ulcers noted.     Onychomycosis  Pain in right toes  Pain in left toes   Consent was obtained for treatment procedures.   Mechanical debridement of nails 1-5  bilaterally performed with a nail nipper.  Filed with dremel without incident.    Return office visit    3 months                  Told patient to return for periodic foot care and evaluation due to potential at risk complications.   Ruffin Cotton DPM

## 2024-03-24 ENCOUNTER — Other Ambulatory Visit: Payer: Self-pay

## 2024-03-24 MED ORDER — RIVAROXABAN 20 MG PO TABS: 20.0000 mg | ORAL_TABLET | Freq: Every day | ORAL | 5 refills | Status: DC

## 2024-03-24 NOTE — Telephone Encounter (Signed)
 Prescription refill request for Xarelto  received.  Indication:afib Last office visit:1/25 Weight:175.1  kg Age:73 Scr:1.13  1/25 CrCl:146.35  ml/min  Prescription refilled

## 2024-03-29 DIAGNOSIS — I480 Paroxysmal atrial fibrillation: Secondary | ICD-10-CM | POA: Diagnosis not present

## 2024-03-29 DIAGNOSIS — I10 Essential (primary) hypertension: Secondary | ICD-10-CM | POA: Diagnosis not present

## 2024-03-29 DIAGNOSIS — E7800 Pure hypercholesterolemia, unspecified: Secondary | ICD-10-CM | POA: Diagnosis not present

## 2024-04-06 NOTE — Progress Notes (Signed)
 Cardiology Office Note Date:  04/06/2024  Patient ID:  Christopher Hanson, Christopher Hanson [birth year], MRN [id number] PCP:  Faustina Hood, MD  Cardiologist:  Dr.Harding Electrophysiologist: Dr. Marven Slimmer    Chief Complaint:  4 mo  History of Present Illness: Christopher Hanson is a 73 y.o. male with history of HTN, BPH, sleep apnea, obesity, AFib  He saw Dr. Marven Slimmer 01/12/23, only c/o carpal tunnel pain, maintaining SR by symptoms, maintained on Tikosyn  and OAC.  I saw him Aug 2024 He continues to do well He was never aware of his AFib by symptoms, though his HR tended to be faster in Afib Reports HRs steadily in the 50's or so. He does not think he has had AFib since on Tikosyn . He wonders of the Covid vaccine maybe triggered the AFib (found only a few days after his 2nd shot at a routine PMD visit) No cardiac concerns Denies any CP, SOB, DOE No near syncope or syncope Reports good medication compliance No bleeding or signs of bleeding QTc stable, planned for labs and 4 mo visit  I saw him 12/09/23 He feels well Thinks maybe 6 mo ago he had about 7 hours of Afib, none since He will occasionally miss an AM dose of Tikosyn  (he thinks), otherwise reports good medication compliance No CP, SOB No dizziness, near syncope or syncope Ruptured R bicep and has not been to the gym in a while Is looking to participate in another weight loss trial Stable QT Long discussion with trial team regarding the importance that they are sure no issues/contraindications to Tikosyn   He reached out Feb with palpitations, erratic HRs, elevated BPs, flushed, lightheaded >> settled, no changes made   TODAY  He is doing well Yesterday though he missed his Toprol  dose and 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 Lasted some hours and settled, did not realized he missed his AM  meds until that evening when he went to take his PM Meds  This has happened once in the past that he recalls about 6 mo ago, uncertain if that time he missed meds Typically very good about his med compliance  He did not enter the trial he spoke of before but considering another, we again spoke of the importance that the investigation drug is OK with his Tikosyn .  No CP, SOB, DOE No near syncope or syncope No bleeding, signs of bleeding  Busy, has a lot of hobbies, likes to ride his motorcycle, plays in 2 band, plus churs, collects trains, and does woodworking No formal exercise   AFib/AAD hx Dx Feb 2021 Tikosyn  started June 2021  Past Medical History:  Diagnosis Date   GERD (gastroesophageal reflux disease)    Hyperlipemia    Hypertension    Morbidly obese (HCC)    BMI 50   OSA on CPAP 03/2020   Diagnosed April-May 2021   Paroxysmal atrial fibrillation (HCC) 01/2018   Persistent paroxysmal A. fib; s/p facilitated DCCV while on Tikosyn  (May 16, 2020)   Seasonal allergies    Wears glasses     Past Surgical History:  Procedure Laterality Date   CARDIOVERSION N/A 04/17/2020   Procedure: CARDIOVERSION;  Surgeon: Harrold Lincoln, MD;  Location: [facility] ENDOSCOPY;  Service: Cardiovascular;  Laterality: N/A;   CARDIOVERSION N/A 05/16/2020   Procedure: CARDIOVERSION;  Surgeon: Wendie Hamburg, MD;  Location: [facility] ENDOSCOPY;  Service: Cardiovascular;  Laterality: N/A;   CARPAL TUNNEL RELEASE     right and left   COLONOSCOPY     CYST EXCISION     face   EAR CYST EXCISION Left 02/11/2015   Procedure: EXCISION OF LEFT POST AURICULAR CYST;  Surgeon: Janita Mellow, MD;  Location: [facility];  Service: ENT;  Laterality: Left;   INGUINAL HERNIA REPAIR     bilat-as infant   NASAL SINUS SURGERY  2000    Current Outpatient Medications  Medication Sig Dispense Refill   acetaminophen  (TYLENOL ) 500 MG tablet Take 1,000 mg by mouth daily at 6 (six) AM.     azelastine  (ASTELIN) 0.1 % nasal spray as needed.     calcium gluconate 500 MG tablet Take 1 tablet by mouth daily. Calcium, Magnesium , zinc  and Vitamin D in it.     Cholecalciferol (VITAMIN D3 PO) Take 2,000 Units by mouth every morning.     dofetilide  (TIKOSYN ) 500 MCG capsule Take 1 capsule (500 mcg total) by mouth 2 (two) times daily. 90 capsule 7   ezetimibe  (ZETIA ) 10 MG tablet Take 10 mg by mouth at bedtime.      ferrous sulfate 325 (65 FE) MG tablet Take 325 mg by mouth daily with breakfast.     metoprolol  succinate (TOPROL -XL) 50 MG 24 hr tablet TAKE 1 AND 1/2 TABLETS(75 MG) BY MOUTH DAILY 135 tablet 1   Multiple Vitamins-Minerals (MENS MULTIVITAMIN) TABS Take 1 tablet by mouth daily.     omeprazole (PRILOSEC) 40 MG capsule Take 40 mg by mouth 4 (four) times a week.     potassium chloride  (KLOR-CON ) 10 MEQ tablet TAKE 1 TABLET(10 MEQ) BY MOUTH DAILY 90 tablet 3   rivaroxaban  (XARELTO ) 20 MG TABS tablet Take 1 tablet (20 mg total) by mouth daily with supper. 30 tablet 5   simvastatin  (ZOCOR ) 40 MG tablet Take 40 mg by mouth at bedtime.     tamsulosin  (FLOMAX ) 0.4 MG CAPS capsule Take 0.4 mg by mouth in the morning and at bedtime.      valsartan  (DIOVAN ) 80 MG tablet Take 1 tablet (80 mg total) by mouth daily. 90 tablet 3   No current facility-administered medications for this visit.    Allergies:   Lisinopril    Social History:  The patient  reports that he has never smoked. He has never used smokeless tobacco. He reports current alcohol use of about 2.0 standard drinks of alcohol per week. He reports that he does not use drugs.   Family History:  The patient's family history is not on file.  ROS:  Please see the history of present illness.    All other systems are reviewed and otherwise negative.   PHYSICAL EXAM:  VS:  There were no vitals taken for this visit. BMI: There is no height or weight on file to calculate BMI. Well nourished, well developed, in no acute distress HEENT:  normocephalic, atraumatic Neck: no JVD, carotid bruits or masses Cardiac:  RRR; no significant murmurs, no rubs, or gallops Lungs: CTA b/l, no wheezing, rhonchi or rales Abd: soft, nontender, obese MS: no deformity or atrophy Ext: trace-1+ edema (reported as chrionic Skin: warm and dry, no rash Neuro:  No gross deficits appreciated Psych: euthymic mood, full affect   EKG:  Done today and reviewed by myself shows  SR 62bpm, QTc  12/09/23:   SR 61bpm, QTc 07/29/23: SB 52, 1st degree AVblock , QTc  02/20/2020: TTE  1. Left ventricular ejection fraction, by estimation, is 55 to 60%. The  left ventricle has normal function. The left ventricle has no regional  wall motion abnormalities. There is moderate left ventricular hypertrophy.  Left ventricular diastolic  parameters are indeterminate.   2. Right ventricular systolic function is normal. The right ventricular  size is normal. There is mildly elevated pulmonary artery systolic  pressure.   3. Left atrial size was severely dilated.   4. Right atrial size was moderately dilated.   5. The mitral valve is normal in structure. Trivial mitral valve  regurgitation. No evidence of mitral stenosis.   6. The aortic valve is tricuspid. Aortic valve regurgitation is not  visualized. No aortic stenosis is present.   7. The inferior vena cava is dilated in size with >50% respiratory  variability, suggesting right atrial pressure of 8 mmHg.    Recent Labs: 07/29/2023: Hemoglobin 13.1; Platelets 177 12/09/2023: BUN 16; Creatinine, Ser 1.13; Magnesium  2.2; Potassium 4.6; Sodium 142  No results found for requested labs within last 365 days.   CrCl cannot be calculated (Patient's most recent lab result is older than the maximum 21 days allowed.).   Wt Readings from Last 3 Encounters:  12/09/23 (!) 386 lb (175.1 kg)  09/23/23 (!) 383 lb (173.7 kg)  07/29/23 (!) 383 lb (173.7 kg)     Other studies reviewed: Additional  studies/records reviewed today include: summarized above  ASSESSMENT AND PLAN:  Persistent AFib CHA2DS2Vasc is 2, on Xarelto  Tikosyn  w/stable QTc meds reviewed No/low burden by symptoms/home HRs Labs today   Given PRN lopressor  for persistent palpitations Suspect 2/2 his missed AM meds If increasing palpitations burden he will let us  know  HTN Looks good   Secondary hypercoagulable state     Disposition:  F/u with us  again in 4 mo, sooner if needed  Current medicines are reviewed at length with the patient today.  The patient did not have any concerns regarding medicines.  [place], PA-C 04/06/2024 7:16 AM     [facility] [address] [place] [address] [phone] (office)  [phone] (fax)

## 2024-04-07 ENCOUNTER — Ambulatory Visit: Payer: Medicare Other | Attending: Physician Assistant | Admitting: Physician Assistant

## 2024-04-07 ENCOUNTER — Encounter: Payer: Self-pay | Admitting: Physician Assistant

## 2024-04-07 ENCOUNTER — Other Ambulatory Visit: Payer: Self-pay | Admitting: Physician Assistant

## 2024-04-07 VITALS — BP 112/72 | HR 64 | Ht 72.0 in | Wt 393.0 lb

## 2024-04-07 DIAGNOSIS — I4819 Other persistent atrial fibrillation: Secondary | ICD-10-CM | POA: Diagnosis not present

## 2024-04-07 DIAGNOSIS — I10 Essential (primary) hypertension: Secondary | ICD-10-CM | POA: Diagnosis not present

## 2024-04-07 DIAGNOSIS — D6869 Other thrombophilia: Secondary | ICD-10-CM | POA: Diagnosis not present

## 2024-04-07 DIAGNOSIS — Z5181 Encounter for therapeutic drug level monitoring: Secondary | ICD-10-CM | POA: Diagnosis not present

## 2024-04-07 DIAGNOSIS — Z79899 Other long term (current) drug therapy: Secondary | ICD-10-CM | POA: Diagnosis not present

## 2024-04-07 LAB — CBC

## 2024-04-07 MED ORDER — METOPROLOL TARTRATE 25 MG PO TABS: 25.0000 mg | ORAL_TABLET | Freq: Four times a day (QID) | ORAL | 1 refills | Status: DC | PRN

## 2024-04-07 NOTE — Patient Instructions (Addendum)
 Medication Instructions:    START TAKING : LOPRESSOR  25 MG EVERY 6 HOURS AS NEEDED FOR PALPATATIONS  *If you need a refill on your cardiac medications before your next appointment, please call your pharmacy*    Lab Work:   PLEASE GO DOWN STAIRS  [facility]  FIRST FLOOR   ( GET OFF ELEVATORS WALK TOWARDS WAITING AREA LAB LOCATED BY PHARMACY):  BMET  MAG AND CBC TODAY     If you have labs (blood work) drawn today and your tests are completely normal, you will receive your results only by: MyChart Message (if you have MyChart) OR A paper copy in the mail If you have any lab test that is abnormal or we need to change your treatment, we will call you to review the results.   Testing/Procedures: NONE ORDERED  TODAY    Follow-Up: At [facility], you and your health needs are our priority.  As part of our continuing mission to provide you with exceptional heart care, our providers are all part of one team.  This team includes your primary Cardiologist (physician) and Advanced Practice Providers or APPs (Physician Assistants and Nurse Practitioners) who all work together to provide you with the care you need, when you need it.    Your next appointment:    4 month(s) ( CONTACT  CASSIE HALL/ ANGELINE HAMMER FOR EP SCHEDULING ISSUES )     Provider:    Mertha Abrahams, PA-C     We recommend signing up for the patient portal called "MyChart".  Sign up information is provided on this After Visit Summary.  MyChart is used to connect with patients for Virtual Visits (Telemedicine).  Patients are able to view lab/test results, encounter notes, upcoming appointments, etc.  Non-urgent messages can be sent to your provider as well.   To learn more about what you can do with MyChart, go to [email or web address].   Other Instructions

## 2024-04-08 LAB — CBC
Hematocrit: 39.9 % (ref 37.5–51.0)
Hemoglobin: 13.5 g/dL (ref 13.0–17.7)
MCH: 30.2 pg (ref 26.6–33.0)
MCHC: 33.8 g/dL (ref 31.5–35.7)
MCV: 89 fL (ref 79–97)
Platelets: 191 10*3/uL (ref 150–450)
RBC: 4.47 x10E6/uL (ref 4.14–5.80)
RDW: 13.1 % (ref 11.6–15.4)
WBC: 9.3 10*3/uL (ref 3.4–10.8)

## 2024-04-08 LAB — BASIC METABOLIC PANEL WITH GFR
BUN/Creatinine Ratio: 19 (ref 10–24)
BUN: 20 mg/dL (ref 8–27)
CO2: 21 mmol/L (ref 20–29)
Calcium: 9.3 mg/dL (ref 8.6–10.2)
Chloride: 104 mmol/L (ref 96–106)
Creatinine, Ser: 1.07 mg/dL (ref 0.76–1.27)
Glucose: 99 mg/dL (ref 70–99)
Potassium: 4.7 mmol/L (ref 3.5–5.2)
Sodium: 139 mmol/L (ref 134–144)
eGFR: 74 mL/min/{1.73_m2} (ref >59)

## 2024-04-08 LAB — MAGNESIUM: Magnesium: 2 mg/dL (ref 1.6–2.3)

## 2024-04-15 DIAGNOSIS — I482 Chronic atrial fibrillation, unspecified: Secondary | ICD-10-CM | POA: Diagnosis not present

## 2024-04-15 DIAGNOSIS — R051 Acute cough: Secondary | ICD-10-CM | POA: Diagnosis not present

## 2024-04-15 DIAGNOSIS — J0110 Acute frontal sinusitis, unspecified: Secondary | ICD-10-CM | POA: Diagnosis not present

## 2024-04-18 NOTE — Telephone Encounter (Signed)
 Yes, they're fine together

## 2024-04-29 DIAGNOSIS — I480 Paroxysmal atrial fibrillation: Secondary | ICD-10-CM | POA: Diagnosis not present

## 2024-04-29 DIAGNOSIS — E7800 Pure hypercholesterolemia, unspecified: Secondary | ICD-10-CM | POA: Diagnosis not present

## 2024-04-29 DIAGNOSIS — I10 Essential (primary) hypertension: Secondary | ICD-10-CM | POA: Diagnosis not present

## 2024-05-19 DIAGNOSIS — H40013 Open angle with borderline findings, low risk, bilateral: Secondary | ICD-10-CM | POA: Diagnosis not present

## 2024-05-19 DIAGNOSIS — H35033 Hypertensive retinopathy, bilateral: Secondary | ICD-10-CM | POA: Diagnosis not present

## 2024-05-19 DIAGNOSIS — H2513 Age-related nuclear cataract, bilateral: Secondary | ICD-10-CM | POA: Diagnosis not present

## 2024-05-19 DIAGNOSIS — H524 Presbyopia: Secondary | ICD-10-CM | POA: Diagnosis not present

## 2024-05-25 DIAGNOSIS — K219 Gastro-esophageal reflux disease without esophagitis: Secondary | ICD-10-CM | POA: Diagnosis not present

## 2024-05-25 DIAGNOSIS — N401 Enlarged prostate with lower urinary tract symptoms: Secondary | ICD-10-CM | POA: Diagnosis not present

## 2024-05-25 DIAGNOSIS — E669 Obesity, unspecified: Secondary | ICD-10-CM | POA: Diagnosis not present

## 2024-05-25 DIAGNOSIS — E7800 Pure hypercholesterolemia, unspecified: Secondary | ICD-10-CM | POA: Diagnosis not present

## 2024-05-25 DIAGNOSIS — Z0000 Encounter for general adult medical examination without abnormal findings: Secondary | ICD-10-CM | POA: Diagnosis not present

## 2024-05-25 DIAGNOSIS — I10 Essential (primary) hypertension: Secondary | ICD-10-CM | POA: Diagnosis not present

## 2024-05-25 DIAGNOSIS — I4819 Other persistent atrial fibrillation: Secondary | ICD-10-CM | POA: Diagnosis not present

## 2024-05-25 DIAGNOSIS — Z1331 Encounter for screening for depression: Secondary | ICD-10-CM | POA: Diagnosis not present

## 2024-05-25 DIAGNOSIS — Z23 Encounter for immunization: Secondary | ICD-10-CM | POA: Diagnosis not present

## 2024-05-29 DIAGNOSIS — I480 Paroxysmal atrial fibrillation: Secondary | ICD-10-CM | POA: Diagnosis not present

## 2024-05-29 DIAGNOSIS — I10 Essential (primary) hypertension: Secondary | ICD-10-CM | POA: Diagnosis not present

## 2024-05-29 DIAGNOSIS — E7800 Pure hypercholesterolemia, unspecified: Secondary | ICD-10-CM | POA: Diagnosis not present

## 2024-06-15 DIAGNOSIS — N401 Enlarged prostate with lower urinary tract symptoms: Secondary | ICD-10-CM | POA: Diagnosis not present

## 2024-06-15 DIAGNOSIS — R351 Nocturia: Secondary | ICD-10-CM | POA: Diagnosis not present

## 2024-06-15 DIAGNOSIS — R3914 Feeling of incomplete bladder emptying: Secondary | ICD-10-CM | POA: Diagnosis not present

## 2024-06-15 DIAGNOSIS — R3912 Poor urinary stream: Secondary | ICD-10-CM | POA: Diagnosis not present

## 2024-06-15 DIAGNOSIS — R3915 Urgency of urination: Secondary | ICD-10-CM | POA: Diagnosis not present

## 2024-06-19 ENCOUNTER — Other Ambulatory Visit: Payer: Self-pay | Admitting: *Deleted

## 2024-06-19 DIAGNOSIS — I480 Paroxysmal atrial fibrillation: Secondary | ICD-10-CM

## 2024-06-19 MED ORDER — RIVAROXABAN 20 MG PO TABS: 20.0000 mg | ORAL_TABLET | Freq: Every day | ORAL | 5 refills | Status: DC

## 2024-06-19 NOTE — Telephone Encounter (Signed)
 Xarelto  20mg  refill request received. Pt is 73 years old, weight-178.3kg, Crea-1.07 on 5/925, last seen by Charlies Arthur on 04/07/24, Diagnosis-Afib, CrCl-157.38 mL/min; Dose is appropriate based on dosing criteria. Will send in refill to requested pharmacy.

## 2024-06-22 ENCOUNTER — Encounter: Payer: Self-pay | Admitting: Podiatry

## 2024-06-22 ENCOUNTER — Ambulatory Visit (INDEPENDENT_AMBULATORY_CARE_PROVIDER_SITE_OTHER): Admitting: Podiatry

## 2024-06-22 DIAGNOSIS — D689 Coagulation defect, unspecified: Secondary | ICD-10-CM | POA: Diagnosis not present

## 2024-06-22 DIAGNOSIS — M79674 Pain in right toe(s): Secondary | ICD-10-CM | POA: Diagnosis not present

## 2024-06-22 DIAGNOSIS — B351 Tinea unguium: Secondary | ICD-10-CM | POA: Diagnosis not present

## 2024-06-22 DIAGNOSIS — M79675 Pain in left toe(s): Secondary | ICD-10-CM

## 2024-06-22 NOTE — Progress Notes (Signed)
 This patient returns to my office for at risk foot care.  This patient requires this care by a professional since this patient will be at risk due to having coagulation defect due to taking xarelto .  This patient is unable to cut nails himself since the patient cannot reach his nails.These nails are painful walking and wearing shoes. Patient also has fissures on both heels due to his callus. This patient presents for at risk foot care today.  General Appearance  Alert, conversant and in no acute stress.  Vascular  Dorsalis pedis and posterior tibial  pulses are weakly  palpable  bilaterally.  Capillary return is within normal limits  bilaterally. Temperature is within normal limits  bilaterally.  Neurologic  Senn-Weinstein monofilament wire test within normal limits  bilaterally. Muscle power within normal limits bilaterally.  Nails Thick disfigured discolored nails with subungual debris  from hallux to fifth toes bilaterally. No evidence of bacterial infection or drainage bilaterally.  Orthopedic  No limitations of motion  feet .  No crepitus or effusions noted.  No bony pathology or digital deformities noted.  Skin  normotropic skin with no porokeratosis noted bilaterally.  No signs of infections or ulcers noted.     Onychomycosis  Pain in right toes  Pain in left toes   Consent was obtained for treatment procedures.   Mechanical debridement of nails 1-5  bilaterally performed with a nail nipper.  Filed with dremel without incident.    Return office visit    3 months                  Told patient to return for periodic foot care and evaluation due to potential at risk complications.   Ruffin Cotton DPM

## 2024-06-29 DIAGNOSIS — I10 Essential (primary) hypertension: Secondary | ICD-10-CM | POA: Diagnosis not present

## 2024-06-29 DIAGNOSIS — E7800 Pure hypercholesterolemia, unspecified: Secondary | ICD-10-CM | POA: Diagnosis not present

## 2024-06-29 DIAGNOSIS — I480 Paroxysmal atrial fibrillation: Secondary | ICD-10-CM | POA: Diagnosis not present

## 2024-07-17 DIAGNOSIS — R3915 Urgency of urination: Secondary | ICD-10-CM | POA: Diagnosis not present

## 2024-07-17 DIAGNOSIS — N401 Enlarged prostate with lower urinary tract symptoms: Secondary | ICD-10-CM | POA: Diagnosis not present

## 2024-07-17 DIAGNOSIS — R3912 Poor urinary stream: Secondary | ICD-10-CM | POA: Diagnosis not present

## 2024-07-17 DIAGNOSIS — R351 Nocturia: Secondary | ICD-10-CM | POA: Diagnosis not present

## 2024-07-17 DIAGNOSIS — R3914 Feeling of incomplete bladder emptying: Secondary | ICD-10-CM | POA: Diagnosis not present

## 2024-07-30 DIAGNOSIS — E7800 Pure hypercholesterolemia, unspecified: Secondary | ICD-10-CM | POA: Diagnosis not present

## 2024-07-30 DIAGNOSIS — I480 Paroxysmal atrial fibrillation: Secondary | ICD-10-CM | POA: Diagnosis not present

## 2024-07-30 DIAGNOSIS — I10 Essential (primary) hypertension: Secondary | ICD-10-CM | POA: Diagnosis not present

## 2024-08-08 NOTE — Progress Notes (Unsigned)
 Cardiology Office Note Date:  08/08/2024  Patient ID:  Christopher Hanson, DOB 19-Apr-1951, MRN [id number] PCP:  [name], MD  Cardiologist:  Dr.Harding Electrophysiologist: Dr. Cindie    Chief Complaint:  *** 4 mo  History of Present Illness: Christopher Hanson is a 73 y.o. male with history of  HTN, BPH, sleep apnea, obesity,  AFib  Saw Dr. Cindie Feb 2024  Seeing myself since then  I saw him 12/09/23 He feels well Thinks maybe 6 mo ago he had about 7 hours of Afib, none since He will occasionally miss an AM dose of Tikosyn  (he thinks), otherwise reports good medication compliance No CP, SOB No dizziness, near syncope or syncope Ruptured R bicep and has not been to the gym in a while Is looking to participate in another weight loss trial Stable QT Long discussion with trial team regarding the importance that they are sure no issues/contraindications to Tikosyn   He reached out Feb with palpitations, erratic HRs, elevated BPs, flushed, lightheaded >> settled, no changes made   I saw him 04/07/24 He is doing well Yesterday though he missed his Toprol  dose and 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 Lasted some hours and settled, did not realized he missed his AM meds until that evening when he went to take his PM Meds This has happened once in the past that he recalls about 6 mo ago, uncertain if that time he missed meds Typically very good about his med compliance He did not enter the trial he spoke of before but considering another, we again spoke of the importance that the investigation drug is OK with his Tikosyn . No CP, SOB, DOE No near syncope or syncope No bleeding, signs of bleeding Busy, has a lot of hobbies, likes to ride his motorcycle, plays in 2 band, plus church, collects trains, and does woodworking No formal  exercise  Rx PRN lopressor    TODAY  *** burden *** tikosyn , EKG, dose, meds, labs *** any further trials, new meds... *** xarelto , dose, bleeding labs  AFib/AAD hx Dx Feb 2021 Tikosyn  started June 2021  Past Medical History:  Diagnosis Date   GERD (gastroesophageal reflux disease)    Hyperlipemia    Hypertension    Morbidly obese (HCC)    BMI 50   OSA on CPAP 03/2020   Diagnosed April-May 2021   Paroxysmal atrial fibrillation (HCC) 01/2018   Persistent paroxysmal A. fib; s/p facilitated DCCV while on Tikosyn  (May 16, 2020)   Seasonal allergies    Wears glasses     Past Surgical History:  Procedure Laterality Date   CARDIOVERSION N/A 04/17/2020   Procedure: CARDIOVERSION;  Surgeon: Barbaraann Darryle Ned, MD;  Location: [facility] ENDOSCOPY;  Service: Cardiovascular;  Laterality: N/A;   CARDIOVERSION N/A 05/16/2020   Procedure: CARDIOVERSION;  Surgeon: Kate Lonni CROME, MD;  Location: [facility] ENDOSCOPY;  Service: Cardiovascular;  Laterality: N/A;   CARPAL TUNNEL RELEASE     right and left   COLONOSCOPY     CYST EXCISION     face   EAR CYST EXCISION Left 02/11/2015   Procedure: EXCISION OF LEFT POST AURICULAR CYST;  Surgeon: Ida Loader, MD;  Location: [facility];  Service: ENT;  Laterality: Left;   INGUINAL HERNIA REPAIR     bilat-as infant   NASAL SINUS SURGERY  2000    Current Outpatient Medications  Medication Sig Dispense Refill   acetaminophen  (TYLENOL ) 500 MG tablet Take 1,000 mg by mouth daily at 6 (six) AM.     azelastine (ASTELIN) 0.1 % nasal spray as needed.     calcium gluconate 500 MG tablet Take 1 tablet by mouth daily. Calcium, Magnesium , zinc  and Vitamin D in it.     Cholecalciferol (VITAMIN D3 PO) Take 2,000 Units by mouth every morning.     dofetilide  (TIKOSYN ) 500 MCG capsule Take 1 capsule (500 mcg total) by mouth 2 (two) times daily. 90 capsule 7   ezetimibe  (ZETIA ) 10 MG tablet Take 10 mg by mouth at bedtime.      ferrous sulfate 325  (65 FE) MG tablet Take 325 mg by mouth daily with breakfast.     metoprolol  succinate (TOPROL -XL) 50 MG 24 hr tablet TAKE 1 AND 1/2 TABLETS(75 MG) BY MOUTH DAILY 135 tablet 1   metoprolol  tartrate (LOPRESSOR ) 25 MG tablet Take 1 tablet (25 mg total) by mouth every 6 (six) hours as needed (PALPITATIONS). 90 tablet 1   Multiple Vitamins-Minerals (MENS MULTIVITAMIN) TABS Take 1 tablet by mouth daily.     omeprazole (PRILOSEC) 40 MG capsule Take 40 mg by mouth 4 (four) times a week.     potassium chloride  (KLOR-CON ) 10 MEQ tablet TAKE 1 TABLET(10 MEQ) BY MOUTH DAILY 90 tablet 3   rivaroxaban  (XARELTO ) 20 MG TABS tablet Take 1 tablet (20 mg total) by mouth daily with supper. 30 tablet 5   simvastatin  (ZOCOR ) 40 MG tablet Take 40 mg by mouth at bedtime.     tamsulosin  (FLOMAX ) 0.4 MG CAPS capsule Take 0.4 mg by mouth in the morning and at bedtime.      valsartan  (DIOVAN ) 80 MG tablet Take 1 tablet (80 mg total) by mouth daily. 90 tablet 3   No current facility-administered medications for this visit.    Allergies:   Lisinopril    Social History:  The patient  reports that he has never smoked. He has never used smokeless tobacco. He reports current alcohol use of about 2.0 standard drinks of alcohol per week. He reports that he does not use drugs.   Family History:  The patient's family history is not on file.  ROS:  Please see the history of present illness.    All other systems are reviewed and otherwise negative.   PHYSICAL EXAM:  VS:  There were no vitals taken for this visit. BMI: There is no height or weight on file to calculate BMI. Well nourished, well developed, in no acute distress HEENT: normocephalic, atraumatic Neck: no JVD, carotid bruits or masses Cardiac:  RRR; no significant murmurs, no rubs, or gallops Lungs: CTA b/l, no wheezing, rhonchi or rales Abd: soft, nontender, obese MS: no deformity or atrophy Ext: trace-1+ edema (reported as chrionic Skin: warm and dry, no  rash Neuro:  No gross deficits appreciated Psych: euthymic mood, full affect   EKG:  Done today and reviewed by myself shows  SR 62bpm, QTc  12/09/23:   SR 61bpm, QTc 07/29/23: SB 52, 1st degree AVblock , QTc   02/20/2020: TTE  1. Left ventricular ejection fraction, by estimation, is 55 to 60%. The  left ventricle has normal function. The left ventricle has no regional  wall motion abnormalities. There is moderate left ventricular hypertrophy.  Left ventricular diastolic  parameters are indeterminate.   2. Right ventricular systolic function is normal. The right ventricular  size is normal. There is mildly elevated pulmonary artery systolic  pressure.   3. Left atrial size was severely dilated.   4. Right atrial size was moderately dilated.   5. The mitral valve is normal in structure. Trivial mitral valve  regurgitation. No evidence of mitral stenosis.   6. The aortic valve is tricuspid. Aortic valve regurgitation is not  visualized. No aortic stenosis is present.   7. The inferior vena cava is dilated in size with >50% respiratory  variability, suggesting right atrial pressure of 8 mmHg.    Recent Labs: 04/07/2024: BUN 20; Creatinine, Ser 1.07; Hemoglobin 13.5; Magnesium  2.0; Platelets 191; Potassium 4.7; Sodium 139  No results found for requested labs within last 365 days.   CrCl cannot be calculated (Patient's most recent lab result is older than the maximum 21 days allowed.).   Wt Readings from Last 3 Encounters:  04/07/24 (!) 393 lb (178.3 kg)  12/09/23 (!) 386 lb (175.1 kg)  09/23/23 (!) 383 lb (173.7 kg)     Other studies reviewed: Additional studies/records reviewed today include: summarized above  ASSESSMENT AND PLAN:  Persistent AFib CHA2DS2Vasc is 2, on Xarelto  Tikosyn  w/*** stable QTc *** meds reviewed *** No/low burden by symptoms/home HRs *** Labs today   HTN *** Looks good   Secondary hypercoagulable  state     Disposition:  F/u with us  again in *** 4 mo, sooner if needed  Current medicines are reviewed at length with the patient today.  The patient did not have any concerns regarding medicines.  Bonney Charlies Arthur, PA-C 08/08/2024 9:23 AM     [facility] [address] [place] [address] [phone] (office)  [phone] (fax)

## 2024-08-10 ENCOUNTER — Ambulatory Visit: Attending: Physician Assistant | Admitting: Physician Assistant

## 2024-08-10 ENCOUNTER — Encounter: Payer: Self-pay | Admitting: Physician Assistant

## 2024-08-10 VITALS — BP 169/88 | HR 59 | Ht 72.0 in | Wt 394.2 lb

## 2024-08-10 DIAGNOSIS — D6869 Other thrombophilia: Secondary | ICD-10-CM | POA: Diagnosis not present

## 2024-08-10 DIAGNOSIS — Z79899 Other long term (current) drug therapy: Secondary | ICD-10-CM | POA: Insufficient documentation

## 2024-08-10 DIAGNOSIS — I4819 Other persistent atrial fibrillation: Secondary | ICD-10-CM | POA: Insufficient documentation

## 2024-08-10 DIAGNOSIS — I10 Essential (primary) hypertension: Secondary | ICD-10-CM | POA: Insufficient documentation

## 2024-08-10 DIAGNOSIS — Z5181 Encounter for therapeutic drug level monitoring: Secondary | ICD-10-CM | POA: Insufficient documentation

## 2024-08-10 NOTE — Patient Instructions (Addendum)
 Medication Instructions:   Your physician recommends that you continue on your current medications as directed. Please refer to the Current Medication list given to you today.   *If you need a refill on your cardiac medications before your next appointment, please call your pharmacy*   Lab Work:  PLEASE GO DOWN STAIRS  [facility]  FIRST FLOOR   ( GET OFF ELEVATORS WALK TOWARDS WAITING AREA LAB LOCATED BY PHARMACY):  BMET AND MAG TODAY      If you have labs (blood work) drawn today and your tests are completely normal, you will receive your results only by: MyChart Message (if you have MyChart) OR A paper copy in the mail If you have any lab test that is abnormal or we need to change your treatment, we will call you to review the results.   Testing/Procedures: NONE ORDERED  TODAY     Follow-Up: At [facility], you and your health needs are our priority.  As part of our continuing mission to provide you with exceptional heart care, our providers are all part of one team.  This team includes your primary Cardiologist (physician) and Advanced Practice Providers or APPs (Physician Assistants and Nurse Practitioners) who all work together to provide you with the care you need, when you need it.   Your next appointment: IN 4 MONTHS  Provider:  You may see OLE ONEIDA HOLTS, MD or Charlies Arthur, PA-C ( CONTACT  CASSIE HALL/ ANGELINE HAMMER FOR EP SCHEDULING ISSUES )     We recommend signing up for the patient portal called MyChart.  Sign up information is provided on this After Visit Summary.  MyChart is used to connect with patients for Virtual Visits (Telemedicine).  Patients are able to view lab/test results, encounter notes, upcoming appointments, etc.  Non-urgent messages can be sent to your provider as well.   To learn more about what you can do with MyChart, go to [email or web address].   Other Instructions

## 2024-08-11 LAB — BASIC METABOLIC PANEL WITH GFR
BUN/Creatinine Ratio: 17 (ref 10–24)
BUN: 17 mg/dL (ref 8–27)
CO2: 19 mmol/L — ABNORMAL LOW (ref 20–29)
Calcium: 9.5 mg/dL (ref 8.6–10.2)
Chloride: 104 mmol/L (ref 96–106)
Creatinine, Ser: 0.99 mg/dL (ref 0.76–1.27)
Glucose: 100 mg/dL — ABNORMAL HIGH (ref 70–99)
Potassium: 4.7 mmol/L (ref 3.5–5.2)
Sodium: 139 mmol/L (ref 134–144)
eGFR: 81 mL/min/1.73 (ref >59)

## 2024-08-11 LAB — MAGNESIUM: Magnesium: 2.1 mg/dL (ref 1.6–2.3)

## 2024-08-15 ENCOUNTER — Ambulatory Visit: Payer: Self-pay | Admitting: Physician Assistant

## 2024-08-17 DIAGNOSIS — N401 Enlarged prostate with lower urinary tract symptoms: Secondary | ICD-10-CM | POA: Diagnosis not present

## 2024-08-22 ENCOUNTER — Telehealth: Payer: Self-pay | Admitting: Cardiology

## 2024-08-22 ENCOUNTER — Other Ambulatory Visit: Payer: Self-pay | Admitting: Urology

## 2024-08-22 NOTE — Telephone Encounter (Signed)
 Christopher Hanson! You recently saw this patient in clinic on 08/10/2024.SABRA Are you able to comment on surgical clearance for upcoming aquablation of prostate scheduled for 09/04/2024?   Pharmacy, can you please comment on how long Xarelto  can be held for upcoming procedure?  Thank you both!

## 2024-08-22 NOTE — Telephone Encounter (Signed)
   Pre-operative Risk Assessment    Patient Name: Christopher Hanson  DOB: Jul 23, 1951 MRN: [id number]   Date of last office visit: 08/10/24  Date of next office visit: nothing scheduled yet    Request for Surgical Clearance    Procedure:  Aquablation of prostate   Date of Surgery:  Clearance 09/04/24                                Surgeon:  Dr. Carolee  Surgeon's Group or Practice Name:  [facility]  Phone number:  [phone]  Fax number:  [phone]    Type of Clearance Requested:   - Medical  - Pharmacy:  Hold Rivaroxaban  (Xarelto )     Type of Anesthesia:  General    Additional requests/questions:    Bonney Sheffield JONELLE Lenora   08/22/2024, 9:23 AM

## 2024-08-23 NOTE — Telephone Encounter (Signed)
 Patient with diagnosis of afib on Xarelto   for anticoagulation.    Procedure: Aquablation of prostate  Date of procedure: 09/04/2024   CHA2DS2-VASc Score = 2   This indicates a 2.2% annual risk of stroke. The patient's score is based upon: CHF History: 0 HTN History: 1 Diabetes History: 0 Stroke History: 0 Vascular Disease History: 0 Age Score: 1 Gender Score: 0     CrCl 113 mL/min  Platelet count 191 K  Patient has not had an Afib/aflutter ablation or Watchman within the last 3 months or DCCV within the last 30 days    Per office protocol, patient can hold Xarelto   for 2 days prior to procedure.     **This guidance is not considered finalized until pre-operative APP has relayed final recommendations.**

## 2024-08-23 NOTE — Telephone Encounter (Signed)
   Patient Name: Christopher Hanson  DOB: September 23, 1951 MRN: [id number]  Primary Cardiologist: Alm Clay, MD  Chart reviewed as part of pre-operative protocol coverage. Given past medical history and time since last visit, based on ACC/AHA guidelines, [name] is at acceptable risk for the planned procedure without further cardiovascular testing.   Per office protocol, patient can hold Xarelto   for 2 days prior to procedure.    The patient was advised that if he develops new symptoms prior to surgery to contact our office to arrange for a follow-up visit, and he verbalized understanding.  I will route this recommendation to the requesting party via Epic fax function and remove from pre-op pool.  Please call with questions.  Lamarr Satterfield, NP 08/23/2024, 11:02 AM

## 2024-08-24 DIAGNOSIS — G4733 Obstructive sleep apnea (adult) (pediatric): Secondary | ICD-10-CM | POA: Diagnosis not present

## 2024-08-29 DIAGNOSIS — I480 Paroxysmal atrial fibrillation: Secondary | ICD-10-CM | POA: Diagnosis not present

## 2024-08-29 DIAGNOSIS — E7800 Pure hypercholesterolemia, unspecified: Secondary | ICD-10-CM | POA: Diagnosis not present

## 2024-08-29 DIAGNOSIS — I10 Essential (primary) hypertension: Secondary | ICD-10-CM | POA: Diagnosis not present

## 2024-08-30 NOTE — Patient Instructions (Signed)
 SURGICAL WAITING ROOM VISITATION  Patients having surgery or a procedure may have no more than 2 support people in the waiting area - these visitors may rotate.    Children under the age of 34 must have an adult with them who is not the patient.  Visitors with respiratory illnesses are discouraged from visiting and should remain at home.  If the patient needs to stay at the hospital during part of their recovery, the visitor guidelines for inpatient rooms apply. Pre-op nurse will coordinate an appropriate time for 1 support person to accompany patient in pre-op.  This support person may not rotate.    Please refer to the [facility] website for the visitor guidelines for Inpatients (after your surgery is over and you are in a regular room).       Your procedure is scheduled on:  09/04/2024    Report to [facility] Main Entrance    Report to admitting at  0730 AM   Call this number if you have problems the morning of surgery [phone]   Do not eat food  or drink :After Midnight.                If you have questions, please contact your surgeon's office.       Oral Hygiene is also important to reduce your risk of infection.                                    Remember - BRUSH YOUR TEETH THE MORNING OF SURGERY WITH YOUR REGULAR TOOTHPASTE  DENTURES WILL BE REMOVED PRIOR TO SURGERY PLEASE DO NOT APPLY Poly grip OR ADHESIVES!!!   Do NOT smoke after Midnight   Stop all vitamins and herbal supplements 7 days before surgery.   Take these medicines the morning of surgery with A SIP OF WATER:  Tikosyn , Toprol , omeprazole, flomax    DO NOT TAKE ANY ORAL DIABETIC MEDICATIONS DAY OF YOUR SURGERY  Bring CPAP mask and tubing day of surgery.                              You may not have any metal on your body including hair pins, jewelry, and body piercing             Do not wear make-up, lotions, powders, perfumes/cologne, or deodorant  Do not wear nail polish  including gel and S&S, artificial/acrylic nails, or any other type of covering on natural nails including finger and toenails. If you have artificial nails, gel coating, etc. that needs to be removed by a nail salon please have this removed prior to surgery or surgery may need to be canceled/ delayed if the surgeon/ anesthesia feels like they are unable to be safely monitored.   Do not shave  48 hours prior to surgery.               Men may shave face and neck.   Do not bring valuables to the hospital. [place] IS NOT             RESPONSIBLE   FOR VALUABLES.   Contacts, glasses, dentures or bridgework may not be worn into surgery.   Bring small overnight bag day of surgery.   DO NOT BRING YOUR HOME MEDICATIONS TO THE HOSPITAL. PHARMACY WILL DISPENSE MEDICATIONS LISTED ON YOUR MEDICATION LIST TO YOU DURING  YOUR ADMISSION IN THE HOSPITAL!    Patients discharged on the day of surgery will not be allowed to drive home.  Someone NEEDS to stay with you for the first 24 hours after anesthesia.   Special Instructions: Bring a copy of your healthcare power of attorney and living will documents the day of surgery if you haven't scanned them before.              Please read over the following fact sheets you were given: IF YOU HAVE QUESTIONS ABOUT YOUR PRE-OP INSTRUCTIONS PLEASE CALL [phone].   If you received a COVID test during your pre-op visit  it is requested that you wear a mask when out in public, stay away from anyone that may not be feeling well and notify your surgeon if you develop symptoms. If you test positive for Covid or have been in contact with anyone that has tested positive in the last 10 days please notify you surgeon.    [facility] - Preparing for Surgery Before surgery, you can play an important role.  Because skin is not sterile, your skin needs to be as free of germs as possible.  You can reduce the number of germs on your skin by washing with CHG (chlorahexidine  gluconate) soap before surgery.  CHG is an antiseptic cleaner which kills germs and bonds with the skin to continue killing germs even after washing. Please DO NOT use if you have an allergy to CHG or antibacterial soaps.  If your skin becomes reddened/irritated stop using the CHG and inform your nurse when you arrive at Short Stay. Do not shave (including legs and underarms) for at least 48 hours prior to the first CHG shower.  You may shave your face/neck. Please follow these instructions carefully:  1.  Shower with CHG Soap the night before surgery and the  morning of Surgery.  2.  If you choose to wash your hair, wash your hair first as usual with your  normal  shampoo.  3.  After you shampoo, rinse your hair and body thoroughly to remove the  shampoo.                           4.  Use CHG as you would any other liquid soap.  You can apply chg directly  to the skin and wash                       Gently with a scrungie or clean washcloth.  5.  Apply the CHG Soap to your body ONLY FROM THE NECK DOWN.   Do not use on face/ open                           Wound or open sores. Avoid contact with eyes, ears mouth and genitals (private parts).                       Wash face,  Genitals (private parts) with your normal soap.             6.  Wash thoroughly, paying special attention to the area where your surgery  will be performed.  7.  Thoroughly rinse your body with warm water from the neck down.  8.  DO NOT shower/wash with your normal soap after using and rinsing off  the CHG Soap.  9.  Pat yourself dry with a clean towel.            10.  Wear clean pajamas.            11.  Place clean sheets on your bed the night of your first shower and do not  sleep with pets. Day of Surgery : Do not apply any lotions/deodorants the morning of surgery.  Please wear clean clothes to the hospital/surgery center.  FAILURE TO FOLLOW THESE INSTRUCTIONS MAY RESULT IN THE CANCELLATION OF YOUR  SURGERY PATIENT SIGNATURE_________________________________  NURSE SIGNATURE__________________________________  ________________________________________________________________________

## 2024-08-30 NOTE — Progress Notes (Signed)
 Anesthesia Review:  PCP: Christopher Hanson  Cardiologist : Christopher Hanson telephone visit- cardiac clearance 08/22/24  Christopher Hanson LOV   PPM/ ICD: Device Orders: Rep Notified:  Chest x-ray : EKG : Echo : Stress test: Cardiac Cath :   Activity level:  Sleep Study/ CPAP : Fasting Blood Sugar :      / Checks Blood Sugar -- times a day:    Blood Thinner/ Instructions /Last Dose: ASA / Instructions/ Last Dose :    Xarelto 

## 2024-09-01 ENCOUNTER — Encounter (HOSPITAL_COMMUNITY): Payer: Self-pay

## 2024-09-01 ENCOUNTER — Encounter (HOSPITAL_COMMUNITY)
Admission: RE | Admit: 2024-09-01 | Discharge: 2024-09-01 | Disposition: A | Discharge status: Home / Self Care | Source: Ambulatory Visit | Attending: Urology | Admitting: Urology

## 2024-09-01 ENCOUNTER — Other Ambulatory Visit: Payer: Self-pay

## 2024-09-01 VITALS — BP 164/76 | HR 61 | Temp 98.3°F | Resp 16 | Ht 72.0 in | Wt 393.0 lb

## 2024-09-01 DIAGNOSIS — K219 Gastro-esophageal reflux disease without esophagitis: Secondary | ICD-10-CM | POA: Diagnosis not present

## 2024-09-01 DIAGNOSIS — Z79899 Other long term (current) drug therapy: Secondary | ICD-10-CM | POA: Insufficient documentation

## 2024-09-01 DIAGNOSIS — G4733 Obstructive sleep apnea (adult) (pediatric): Secondary | ICD-10-CM | POA: Diagnosis not present

## 2024-09-01 DIAGNOSIS — Z6841 Body Mass Index (BMI) 40.0 and over, adult: Secondary | ICD-10-CM | POA: Insufficient documentation

## 2024-09-01 DIAGNOSIS — E669 Obesity, unspecified: Secondary | ICD-10-CM | POA: Insufficient documentation

## 2024-09-01 DIAGNOSIS — I480 Paroxysmal atrial fibrillation: Secondary | ICD-10-CM | POA: Diagnosis not present

## 2024-09-01 DIAGNOSIS — I10 Essential (primary) hypertension: Secondary | ICD-10-CM | POA: Insufficient documentation

## 2024-09-01 DIAGNOSIS — Z01812 Encounter for preprocedural laboratory examination: Secondary | ICD-10-CM | POA: Diagnosis not present

## 2024-09-01 DIAGNOSIS — Z7901 Long term (current) use of anticoagulants: Secondary | ICD-10-CM | POA: Insufficient documentation

## 2024-09-01 DIAGNOSIS — Z01818 Encounter for other preprocedural examination: Secondary | ICD-10-CM

## 2024-09-01 HISTORY — DX: Personal history of urinary calculi: Z87.442

## 2024-09-01 HISTORY — DX: Unspecified osteoarthritis, unspecified site: M19.90

## 2024-09-01 LAB — BASIC METABOLIC PANEL WITH GFR
Anion gap: 11 (ref 5–15)
BUN: 18 mg/dL (ref 8–23)
CO2: 24 mmol/L (ref 22–32)
Calcium: 9.1 mg/dL (ref 8.9–10.3)
Chloride: 109 mmol/L (ref 98–111)
Creatinine, Ser: 0.98 mg/dL (ref 0.61–1.24)
GFR, Estimated: >60 mL/min (ref >60)
Glucose, Bld: 105 mg/dL — ABNORMAL HIGH (ref 70–99)
Potassium: 4.5 mmol/L (ref 3.5–5.1)
Sodium: 143 mmol/L (ref 135–145)

## 2024-09-01 LAB — CBC
HCT: 40.4 % (ref 39.0–52.0)
Hemoglobin: 12.9 g/dL — ABNORMAL LOW (ref 13.0–17.0)
MCH: 28.7 pg (ref 26.0–34.0)
MCHC: 31.9 g/dL (ref 30.0–36.0)
MCV: 89.8 fL (ref 80.0–100.0)
Platelets: 168 K/uL (ref 150–400)
RBC: 4.5 MIL/uL (ref 4.22–5.81)
RDW: 13.2 % (ref 11.5–15.5)
WBC: 5.5 K/uL (ref 4.0–10.5)
nRBC: 0 % (ref 0.0–0.2)

## 2024-09-01 NOTE — Progress Notes (Signed)
 Anesthesia Chart Review:  73 year old male follows with cardiology for history of HTN, OSA on CPAP, paroxysmal A-fib on Xarelto  and Tikosyn .  Echo 01/2020 showed LVEF 55 to 60%, normal RV, mildly elevated PASP, no significant valvular abnormalities.  Patient reports last dose of Xarelto  08/31/24.  Other pertinent history includes GERD on PPI, obesity BMI 53.  Preop labs reviewed, unremarkable.  EKG 08/10/24: SB vs low atrial rhythm. Rate 59. No significant changes from last  02/20/2020: TTE  1. Left ventricular ejection fraction, by estimation, is 55 to 60%. The  left ventricle has normal function. The left ventricle has no regional  wall motion abnormalities. There is moderate left ventricular hypertrophy.  Left ventricular diastolic  parameters are indeterminate.   2. Right ventricular systolic function is normal. The right ventricular  size is normal. There is mildly elevated pulmonary artery systolic  pressure.   3. Left atrial size was severely dilated.   4. Right atrial size was moderately dilated.   5. The mitral valve is normal in structure. Trivial mitral valve  regurgitation. No evidence of mitral stenosis.   6. The aortic valve is tricuspid. Aortic valve regurgitation is not  visualized. No aortic stenosis is present.   7. The inferior vena cava is dilated in size with >50% respiratory  variability, suggesting right atrial pressure of 8 mmHg.     Christopher Hanson [facility] Short Stay Center/Anesthesiology Phone [phone] 09/01/2024 10:37 AM

## 2024-09-01 NOTE — Anesthesia Preprocedure Evaluation (Addendum)
 Anesthesia Evaluation  Patient identified by MRN, date of birth, ID band Patient awake    Reviewed: Allergy & Precautions, NPO status , Patient's Chart, lab work & pertinent test results  History of Anesthesia Complications Negative for: history of anesthetic complications  Airway Mallampati: II  TM Distance: >3 FB Neck ROM: Full    Dental  (+) Missing, Dental Advisory Given   Pulmonary sleep apnea and Continuous Positive Airway Pressure Ventilation    breath sounds clear to auscultation       Cardiovascular hypertension, Pt. on medications and Pt. on home beta blockers (-) angina + dysrhythmias Atrial Fibrillation  Rhythm:Regular Rate:Normal  01/2020 ECHO: EF 55 to 60%.  1. The LV has normal function, no regional wall motion abnormalities. There is moderate LVH   2. RVF is normal. The right ventricular size is normal. There is mildly elevated pulmonary artery systolic pressure.   3. Left atrial size was severely dilated.   4. Right atrial size was moderately dilated.   5. The mitral valve is normal in structure. Trivial mitral valve regurgitation. No evidence of mitral stenosis.   6. The aortic valve is tricuspid. Aortic valve regurgitation is not visualized. No aortic stenosis is present.     Neuro/Psych negative neurological ROS     GI/Hepatic Neg liver ROS,GERD  Controlled and Medicated,,  Endo/Other  BMI 53  Renal/GU negative Renal ROS     Musculoskeletal  (+) Arthritis ,    Abdominal   Peds  Hematology Hb 12.9, plt 168k xarelto    Anesthesia Other Findings   Reproductive/Obstetrics                              Anesthesia Physical Anesthesia Plan  ASA: 3  Anesthesia Plan: General   Post-op Pain Management: Tylenol  PO (pre-op)*   Induction: Intravenous  PONV Risk Score and Plan: 2 and Ondansetron  and Dexamethasone   Airway Management Planned: Oral ETT  Additional Equipment:  None  Intra-op Plan:   Post-operative Plan: Extubation in OR  Informed Consent: I have reviewed the patients History and Physical, chart, labs and discussed the procedure including the risks, benefits and alternatives for the proposed anesthesia with the patient or authorized representative who has indicated his/her understanding and acceptance.     Dental advisory given  Plan Discussed with: CRNA and Surgeon  Anesthesia Plan Comments: (PAT note by Lynwood Hope, PA-C: 73 year old male follows with cardiology for history of HTN, OSA on CPAP, paroxysmal A-fib on Xarelto  and Tikosyn .  Echo 01/2020 showed LVEF 55 to 60%, normal RV, mildly elevated PASP, no significant valvular abnormalities.  Patient reports last dose of Xarelto  08/31/24.  Other pertinent history includes GERD on PPI, obesity BMI 53.  Preop labs reviewed, unremarkable.  EKG 08/10/24: SB vs low atrial rhythm. Rate 59. No significant changes from last  02/20/2020: TTE 1. Left ventricular ejection fraction, by estimation, is 55 to 60%. The  left ventricle has normal function. The left ventricle has no regional  wall motion abnormalities. There is moderate left ventricular hypertrophy.  Left ventricular diastolic  parameters are indeterminate.  2. Right ventricular systolic function is normal. The right ventricular  size is normal. There is mildly elevated pulmonary artery systolic  pressure.  3. Left atrial size was severely dilated.  4. Right atrial size was moderately dilated.  5. The mitral valve is normal in structure. Trivial mitral valve  regurgitation. No evidence of mitral stenosis.  6. The aortic  valve is tricuspid. Aortic valve regurgitation is not  visualized. No aortic stenosis is present.  7. The inferior vena cava is dilated in size with >50% respiratory  variability, suggesting right atrial pressure of 8 mmHg.    )         Anesthesia Quick Evaluation

## 2024-09-04 ENCOUNTER — Other Ambulatory Visit: Payer: Self-pay | Admitting: Cardiology

## 2024-09-04 ENCOUNTER — Other Ambulatory Visit: Payer: Self-pay

## 2024-09-04 ENCOUNTER — Encounter (HOSPITAL_COMMUNITY): Payer: Self-pay | Admitting: Urology

## 2024-09-04 ENCOUNTER — Ambulatory Visit (HOSPITAL_COMMUNITY): Admitting: Anesthesiology

## 2024-09-04 ENCOUNTER — Encounter (HOSPITAL_COMMUNITY)
Admission: RE | Disposition: A | Discharge status: Home / Self Care | Payer: Self-pay | Source: Home / Self Care | Attending: Urology

## 2024-09-04 ENCOUNTER — Ambulatory Visit (HOSPITAL_COMMUNITY): Payer: Self-pay | Admitting: Physician Assistant

## 2024-09-04 ENCOUNTER — Observation Stay (HOSPITAL_COMMUNITY)
Admission: RE | Admit: 2024-09-04 | Discharge: 2024-09-05 | Disposition: A | Discharge status: Home / Self Care | Attending: Urology | Admitting: Urology

## 2024-09-04 DIAGNOSIS — Z79899 Other long term (current) drug therapy: Secondary | ICD-10-CM | POA: Insufficient documentation

## 2024-09-04 DIAGNOSIS — I480 Paroxysmal atrial fibrillation: Secondary | ICD-10-CM | POA: Diagnosis not present

## 2024-09-04 DIAGNOSIS — Z6841 Body Mass Index (BMI) 40.0 and over, adult: Secondary | ICD-10-CM | POA: Insufficient documentation

## 2024-09-04 DIAGNOSIS — R350 Frequency of micturition: Secondary | ICD-10-CM

## 2024-09-04 DIAGNOSIS — I10 Essential (primary) hypertension: Secondary | ICD-10-CM

## 2024-09-04 DIAGNOSIS — K219 Gastro-esophageal reflux disease without esophagitis: Secondary | ICD-10-CM | POA: Diagnosis not present

## 2024-09-04 DIAGNOSIS — G4733 Obstructive sleep apnea (adult) (pediatric): Secondary | ICD-10-CM | POA: Diagnosis not present

## 2024-09-04 DIAGNOSIS — N401 Enlarged prostate with lower urinary tract symptoms: Principal | ICD-10-CM | POA: Insufficient documentation

## 2024-09-04 DIAGNOSIS — N400 Enlarged prostate without lower urinary tract symptoms: Principal | ICD-10-CM | POA: Diagnosis present

## 2024-09-04 DIAGNOSIS — Z01818 Encounter for other preprocedural examination: Secondary | ICD-10-CM

## 2024-09-04 DIAGNOSIS — R3912 Poor urinary stream: Secondary | ICD-10-CM | POA: Insufficient documentation

## 2024-09-04 LAB — TYPE AND SCREEN
ABO/RH(D): A POS
Antibody Screen: NEGATIVE

## 2024-09-04 LAB — ABO/RH: ABO/RH(D): A POS

## 2024-09-04 SURGERY — ABLATION, PROSTATE, TRANSURETHRAL, USING WATERJET
Anesthesia: General

## 2024-09-04 MED ORDER — HYDROMORPHONE HCL 1 MG/ML IJ SOLN
INTRAMUSCULAR | Status: AC
  Filled 2024-09-04: qty 1

## 2024-09-04 MED ORDER — OXYCODONE HCL 5 MG PO TABS
5.0000 mg | ORAL_TABLET | ORAL | Status: DC | PRN
  Administered 2024-09-04 (×2): 5 mg via ORAL
  Filled 2024-09-04: qty 1

## 2024-09-04 MED ORDER — CHLORHEXIDINE GLUCONATE 0.12 % MT SOLN
15.0000 mL | Freq: Once | OROMUCOSAL | Status: AC
  Administered 2024-09-04: 15 mL via OROMUCOSAL

## 2024-09-04 MED ORDER — DIPHENHYDRAMINE HCL 12.5 MG/5ML PO ELIX: 12.5000 mg | ORAL_SOLUTION | Freq: Four times a day (QID) | ORAL | Status: DC | PRN

## 2024-09-04 MED ORDER — OXYCODONE HCL 5 MG/5ML PO SOLN: 5.0000 mg | Freq: Once | ORAL | Status: AC | PRN

## 2024-09-04 MED ORDER — STERILE WATER FOR IRRIGATION IR SOLN
Status: DC | PRN
  Administered 2024-09-04: 500 mL

## 2024-09-04 MED ORDER — FINASTERIDE 5 MG PO TABS
5.0000 mg | ORAL_TABLET | Freq: Every day | ORAL | Status: DC
  Administered 2024-09-05: 5 mg via ORAL
  Filled 2024-09-04: qty 1

## 2024-09-04 MED ORDER — ROCURONIUM BROMIDE 10 MG/ML (PF) SYRINGE
PREFILLED_SYRINGE | INTRAVENOUS | Status: AC
  Filled 2024-09-04: qty 10

## 2024-09-04 MED ORDER — DOFETILIDE 250 MCG PO CAPS
500.0000 ug | ORAL_CAPSULE | Freq: Two times a day (BID) | ORAL | Status: DC
  Administered 2024-09-04 – 2024-09-05 (×2): 500 ug via ORAL
  Filled 2024-09-04 (×2): qty 2

## 2024-09-04 MED ORDER — MORPHINE SULFATE (PF) 2 MG/ML IV SOLN
2.0000 mg | INTRAVENOUS | Status: DC | PRN
  Administered 2024-09-04: 2 mg via INTRAVENOUS
  Filled 2024-09-04: qty 1

## 2024-09-04 MED ORDER — OXYCODONE HCL 5 MG PO TABS
ORAL_TABLET | ORAL | Status: AC
  Filled 2024-09-04: qty 1

## 2024-09-04 MED ORDER — ACETAMINOPHEN 325 MG PO TABS
ORAL_TABLET | ORAL | Status: AC
Start: 2024-09-04 — End: 2024-09-04
  Filled 2024-09-04: qty 2

## 2024-09-04 MED ORDER — SODIUM CHLORIDE 0.9 % IR SOLN
Status: DC | PRN
  Administered 2024-09-04 (×4): 3000 mL via INTRAVESICAL

## 2024-09-04 MED ORDER — PROPOFOL 10 MG/ML IV BOLUS
INTRAVENOUS | Status: DC | PRN
  Administered 2024-09-04: 200 mg via INTRAVENOUS

## 2024-09-04 MED ORDER — PANTOPRAZOLE SODIUM 40 MG PO TBEC
80.0000 mg | DELAYED_RELEASE_TABLET | Freq: Every day | ORAL | Status: DC
  Administered 2024-09-05: 80 mg via ORAL
  Filled 2024-09-04: qty 2

## 2024-09-04 MED ORDER — ZOLPIDEM TARTRATE 5 MG PO TABS: 5.0000 mg | ORAL_TABLET | Freq: Every evening | ORAL | Status: DC | PRN

## 2024-09-04 MED ORDER — SODIUM CHLORIDE 0.9 % IR SOLN
3000.0000 mL | Status: DC
  Administered 2024-09-04 – 2024-09-05 (×2): 3000 mL

## 2024-09-04 MED ORDER — SUGAMMADEX SODIUM 200 MG/2ML IV SOLN
INTRAVENOUS | Status: AC
  Filled 2024-09-04: qty 4

## 2024-09-04 MED ORDER — OXYBUTYNIN CHLORIDE 5 MG PO TABS
5.0000 mg | ORAL_TABLET | Freq: Three times a day (TID) | ORAL | Status: DC | PRN
  Administered 2024-09-04 – 2024-09-05 (×3): 5 mg via ORAL
  Filled 2024-09-04 (×2): qty 1

## 2024-09-04 MED ORDER — ACETAMINOPHEN 325 MG PO TABS
650.0000 mg | ORAL_TABLET | ORAL | Status: DC | PRN
  Administered 2024-09-04: 650 mg via ORAL

## 2024-09-04 MED ORDER — EPHEDRINE 5 MG/ML INJ
INTRAVENOUS | Status: AC
  Filled 2024-09-04: qty 5

## 2024-09-04 MED ORDER — 0.9 % SODIUM CHLORIDE (POUR BTL) OPTIME
TOPICAL | Status: DC | PRN
  Administered 2024-09-04: 1000 mL

## 2024-09-04 MED ORDER — OXYBUTYNIN CHLORIDE 5 MG PO TABS
ORAL_TABLET | ORAL | Status: AC
  Filled 2024-09-04: qty 1

## 2024-09-04 MED ORDER — OXYCODONE HCL 5 MG PO TABS
5.0000 mg | ORAL_TABLET | Freq: Once | ORAL | Status: AC | PRN
  Administered 2024-09-04: 5 mg via ORAL

## 2024-09-04 MED ORDER — CEFAZOLIN SODIUM-DEXTROSE 3-4 GM/150ML-% IV SOLN
3.0000 g | INTRAVENOUS | Status: AC
  Administered 2024-09-04: 3 g via INTRAVENOUS
  Filled 2024-09-04: qty 150

## 2024-09-04 MED ORDER — FENTANYL CITRATE (PF) 100 MCG/2ML IJ SOLN
INTRAMUSCULAR | Status: AC
  Filled 2024-09-04: qty 2

## 2024-09-04 MED ORDER — ONDANSETRON HCL 4 MG/2ML IJ SOLN: 4.0000 mg | INTRAMUSCULAR | Status: DC | PRN

## 2024-09-04 MED ORDER — DEXAMETHASONE SODIUM PHOSPHATE 10 MG/ML IJ SOLN
INTRAMUSCULAR | Status: AC
  Filled 2024-09-04: qty 1

## 2024-09-04 MED ORDER — FENTANYL CITRATE (PF) 100 MCG/2ML IJ SOLN
INTRAMUSCULAR | Status: DC | PRN
  Administered 2024-09-04: 50 ug via INTRAVENOUS
  Administered 2024-09-04: 100 ug via INTRAVENOUS
  Administered 2024-09-04: 50 ug via INTRAVENOUS

## 2024-09-04 MED ORDER — LACTATED RINGERS IV SOLN: INTRAVENOUS | Status: DC

## 2024-09-04 MED ORDER — LIDOCAINE HCL (PF) 2 % IJ SOLN
INTRAMUSCULAR | Status: DC | PRN
  Administered 2024-09-04: 100 mg via INTRADERMAL

## 2024-09-04 MED ORDER — HYDROMORPHONE HCL 1 MG/ML IJ SOLN
0.2500 mg | INTRAMUSCULAR | Status: DC | PRN
  Administered 2024-09-04 (×4): 0.5 mg via INTRAVENOUS

## 2024-09-04 MED ORDER — ONDANSETRON HCL 4 MG/2ML IJ SOLN
INTRAMUSCULAR | Status: DC | PRN
  Administered 2024-09-04: 4 mg via INTRAVENOUS

## 2024-09-04 MED ORDER — IRBESARTAN 150 MG PO TABS
75.0000 mg | ORAL_TABLET | Freq: Every day | ORAL | Status: DC
  Administered 2024-09-05: 75 mg via ORAL
  Filled 2024-09-04: qty 1

## 2024-09-04 MED ORDER — ACETAMINOPHEN 500 MG PO TABS
1000.0000 mg | ORAL_TABLET | Freq: Once | ORAL | Status: AC
  Administered 2024-09-04: 1000 mg via ORAL
  Filled 2024-09-04: qty 2

## 2024-09-04 MED ORDER — ORAL CARE MOUTH RINSE: 15.0000 mL | Freq: Once | OROMUCOSAL | Status: AC

## 2024-09-04 MED ORDER — EZETIMIBE 10 MG PO TABS
10.0000 mg | ORAL_TABLET | Freq: Every day | ORAL | Status: DC
  Administered 2024-09-04: 10 mg via ORAL
  Filled 2024-09-04: qty 1

## 2024-09-04 MED ORDER — DEXMEDETOMIDINE HCL IN NACL 80 MCG/20ML IV SOLN
INTRAVENOUS | Status: DC | PRN
  Administered 2024-09-04: 8 ug via INTRAVENOUS
  Administered 2024-09-04: 4 ug via INTRAVENOUS

## 2024-09-04 MED ORDER — ONDANSETRON HCL 4 MG/2ML IJ SOLN
INTRAMUSCULAR | Status: AC
Start: 2024-09-04 — End: 2024-09-04
  Filled 2024-09-04: qty 2

## 2024-09-04 MED ORDER — DIPHENHYDRAMINE HCL 50 MG/ML IJ SOLN: 12.5000 mg | Freq: Four times a day (QID) | INTRAMUSCULAR | Status: DC | PRN

## 2024-09-04 MED ORDER — SIMVASTATIN 40 MG PO TABS
40.0000 mg | ORAL_TABLET | Freq: Every day | ORAL | Status: DC
  Administered 2024-09-04: 40 mg via ORAL
  Filled 2024-09-04: qty 1

## 2024-09-04 MED ORDER — MEPERIDINE HCL 100 MG/ML IJ SOLN: 6.2500 mg | INTRAMUSCULAR | Status: DC | PRN

## 2024-09-04 MED ORDER — SODIUM CHLORIDE 0.9 % IV SOLN: INTRAVENOUS | Status: DC

## 2024-09-04 MED ORDER — PROPOFOL 10 MG/ML IV BOLUS
INTRAVENOUS | Status: AC
  Filled 2024-09-04: qty 20

## 2024-09-04 MED ORDER — SENNOSIDES-DOCUSATE SODIUM 8.6-50 MG PO TABS
2.0000 | ORAL_TABLET | Freq: Every day | ORAL | Status: DC
  Administered 2024-09-04: 2 via ORAL
  Filled 2024-09-04: qty 2

## 2024-09-04 MED ORDER — TRANEXAMIC ACID-NACL 1000-0.7 MG/100ML-% IV SOLN
1000.0000 mg | INTRAVENOUS | Status: AC
  Administered 2024-09-04: 1000 mg via INTRAVENOUS
  Filled 2024-09-04: qty 100

## 2024-09-04 MED ORDER — HYDROCODONE-ACETAMINOPHEN 5-325 MG PO TABS: 1.0000 | ORAL_TABLET | Freq: Four times a day (QID) | ORAL | 0 refills | Status: DC | PRN

## 2024-09-04 MED ORDER — METOPROLOL SUCCINATE ER 50 MG PO TB24
75.0000 mg | ORAL_TABLET | Freq: Every day | ORAL | Status: DC
  Administered 2024-09-05: 75 mg via ORAL
  Filled 2024-09-04: qty 2

## 2024-09-04 MED ORDER — SUGAMMADEX SODIUM 200 MG/2ML IV SOLN
INTRAVENOUS | Status: DC | PRN
  Administered 2024-09-04: 400 mg via INTRAVENOUS

## 2024-09-04 MED ORDER — MIDAZOLAM HCL 2 MG/2ML IJ SOLN: 0.5000 mg | Freq: Once | INTRAMUSCULAR | Status: DC | PRN

## 2024-09-04 MED ORDER — ROCURONIUM BROMIDE 10 MG/ML (PF) SYRINGE
PREFILLED_SYRINGE | INTRAVENOUS | Status: DC | PRN
  Administered 2024-09-04: 40 mg via INTRAVENOUS
  Administered 2024-09-04: 60 mg via INTRAVENOUS

## 2024-09-04 MED ORDER — DEXAMETHASONE SODIUM PHOSPHATE 10 MG/ML IJ SOLN
INTRAMUSCULAR | Status: DC | PRN
  Administered 2024-09-04: 10 mg via INTRAVENOUS

## 2024-09-04 SURGICAL SUPPLY — 26 items
BAG URINE DRAIN 2000ML AR STRL (UROLOGICAL SUPPLIES) ×1 IMPLANT
BOOTIES KNEE HIGH SLOAN (MISCELLANEOUS) ×1 IMPLANT
CANISTER SUCT 3000ML PPV (MISCELLANEOUS) ×1 IMPLANT
CATH HEMA 3WAY 30CC 22FR COUDE (CATHETERS) IMPLANT
CATH HEMA 3WAY 30CC 24FR COUDE (CATHETERS) IMPLANT
COVER MAYO STAND STRL (DRAPES) ×1 IMPLANT
DRAPE FOOT SWITCH (DRAPES) ×1 IMPLANT
GEL ULTRASOUND 8.5O AQUASONIC (MISCELLANEOUS) ×1 IMPLANT
GLOVE BIO SURGEON STRL SZ7.5 (GLOVE) ×1 IMPLANT
GOWN STRL REUS W/ TWL XL LVL3 (GOWN DISPOSABLE) ×1 IMPLANT
HANDPIECE AQUABEAM (MISCELLANEOUS) ×1 IMPLANT
HOLDER FOLEY CATH W/STRAP (MISCELLANEOUS) IMPLANT
JELLY LUBE HR FLIP 4OZ (MISCELLANEOUS) ×1 IMPLANT
KIT TURNOVER KIT A (KITS) ×1 IMPLANT
LOOP CUT BIPOLAR 24F LRG (ELECTROSURGICAL) ×1 IMPLANT
MANIFOLD NEPTUNE II (INSTRUMENTS) ×1 IMPLANT
MAT ABSORB FLUID 56X50 GRAY (MISCELLANEOUS) ×2 IMPLANT
PACK CYSTO (CUSTOM PROCEDURE TRAY) ×1 IMPLANT
PACK DRAPE AQUABEAM (MISCELLANEOUS) ×1 IMPLANT
PAD PREP 24X48 CUFFED NSTRL (MISCELLANEOUS) ×1 IMPLANT
SYR 30ML LL (SYRINGE) ×1 IMPLANT
SYRINGE TOOMEY IRRIG 70ML (MISCELLANEOUS) ×2 IMPLANT
TOWEL OR 17X26 10 PK STRL BLUE (TOWEL DISPOSABLE) ×1 IMPLANT
TUBING CONNECTING 10 (TUBING) ×2 IMPLANT
TUBING UROLOGY SET (TUBING) ×1 IMPLANT
UNDERPAD 30X36 HEAVY ABSORB (UNDERPADS AND DIAPERS) ×1 IMPLANT

## 2024-09-04 NOTE — Anesthesia Postprocedure Evaluation (Signed)
 Anesthesia Post Note  Patient: Christopher Hanson  Procedure(s) Performed: ABLATION, PROSTATE, TRANSURETHRAL, USING WATERJET     Patient location during evaluation: PACU Anesthesia Type: General Level of consciousness: awake and alert, oriented and patient cooperative Pain management: pain level controlled Vital Signs Assessment: post-procedure vital signs reviewed and stable Respiratory status: spontaneous breathing, nonlabored ventilation and respiratory function stable Cardiovascular status: blood pressure returned to baseline and stable Postop Assessment: no apparent nausea or vomiting Anesthetic complications: no   No notable events documented.  Last Vitals:  Vitals:   09/04/24 1230 09/04/24 1245  BP: (!) 157/71 (!) 149/69  Pulse: (!) 59 (!) 53  Resp: 16 14  Temp:    SpO2: 96% 95%    Last Pain:  Vitals:   09/04/24 1259  TempSrc:   PainSc: 5                  Emberly Tomasso,E. Mariapaula Krist

## 2024-09-04 NOTE — H&P (Signed)
 H&P  Chief Complaint: BPH  History of Present Illness: [name] with BPH here for aquablation  Past Medical History:  Diagnosis Date   Arthritis    GERD (gastroesophageal reflux disease)    History of kidney stones    Hyperlipemia    Hypertension    Morbidly obese (HCC)    BMI 50   OSA on CPAP 03/2020   Diagnosed April-May 2021   Paroxysmal atrial fibrillation (HCC) 01/2018   Persistent paroxysmal A. fib; s/p facilitated DCCV while on Tikosyn  (May 16, 2020)   Seasonal allergies    Wears glasses    Past Surgical History:  Procedure Laterality Date   CARDIOVERSION N/A 04/17/2020   Procedure: CARDIOVERSION;  Surgeon: Barbaraann Darryle Ned, MD;  Location: [facility] ENDOSCOPY;  Service: Cardiovascular;  Laterality: N/A;   CARDIOVERSION N/A 05/16/2020   Procedure: CARDIOVERSION;  Surgeon: Kate Lonni CROME, MD;  Location: [facility] ENDOSCOPY;  Service: Cardiovascular;  Laterality: N/A;   CARPAL TUNNEL RELEASE     right and left   COLONOSCOPY     CYST EXCISION     face   EAR CYST EXCISION Left 02/11/2015   Procedure: EXCISION OF LEFT POST AURICULAR CYST;  Surgeon: Ida Loader, MD;  Location:  SURGERY CENTER;  Service: ENT;  Laterality: Left;   INGUINAL HERNIA REPAIR     bilat-as infant   NASAL SINUS SURGERY  2000    Home Medications:  Medications Prior to Admission  Medication Sig Dispense Refill Last Dose/Taking   acetaminophen  (TYLENOL ) 500 MG tablet Take 1,000 mg by mouth every 6 (six) hours as needed for moderate pain (pain score 4-6).   Taking As Needed   azelastine (ASTELIN) 0.1 % nasal spray Place 1 spray into both nostrils as needed for rhinitis or allergies.   Taking As Needed   calcium carbonate (TUMS - DOSED IN MG ELEMENTAL CALCIUM) 500 MG chewable tablet Chew 1 tablet by mouth as needed for indigestion or heartburn.   Taking As Needed   calcium gluconate 500 MG tablet Take 1 tablet by mouth daily. Calcium, Magnesium , zinc  and Vitamin D in it.   Taking    Cholecalciferol (VITAMIN D3 PO) Take 2,000 Units by mouth every morning.   Taking   dofetilide  (TIKOSYN ) 500 MCG capsule Take 1 capsule (500 mcg total) by mouth 2 (two) times daily. 90 capsule 7 Taking   ezetimibe  (ZETIA ) 10 MG tablet Take 10 mg by mouth at bedtime.    Taking   finasteride (PROSCAR) 5 MG tablet Take 5 mg by mouth daily.   Taking   metoprolol  succinate (TOPROL -XL) 50 MG 24 hr tablet TAKE 1 AND 1/2 TABLETS(75 MG) BY MOUTH DAILY 135 tablet 1 Taking   metoprolol  tartrate (LOPRESSOR ) 25 MG tablet Take 1 tablet (25 mg total) by mouth every 6 (six) hours as needed (PALPITATIONS). 90 tablet 1 Taking As Needed   Multiple Vitamins-Minerals (MENS MULTIVITAMIN) TABS Take 1 tablet by mouth daily.   Taking   omeprazole (PRILOSEC) 40 MG capsule Take 40 mg by mouth 4 (four) times a week.   Taking   potassium chloride  (KLOR-CON ) 10 MEQ tablet TAKE 1 TABLET(10 MEQ) BY MOUTH DAILY 90 tablet 3 Taking   rivaroxaban  (XARELTO ) 20 MG TABS tablet Take 1 tablet (20 mg total) by mouth daily with supper. 30 tablet 5 Taking   simvastatin  (ZOCOR ) 40 MG tablet Take 40 mg by mouth at bedtime.   Taking   tamsulosin  (FLOMAX ) 0.4 MG CAPS capsule Take 0.4 mg by mouth  in the morning and at bedtime.    Taking   valsartan  (DIOVAN ) 80 MG tablet Take 1 tablet (80 mg total) by mouth daily. 90 tablet 3 Taking   Allergies:  Allergies  Allergen Reactions   Lisinopril  Cough    No family history on file. Social History:  reports that he has never smoked. He has never used smokeless tobacco. He reports that he does not drink alcohol and does not use drugs.  ROS: A complete review of systems was performed.  All systems are negative except for pertinent findings as noted. ROS   Physical Exam:  Vital signs in last 24 hours: Weight:  [178 kg] 178 kg (10/06 0717) General:  Alert and oriented, No acute distress HEENT: Normocephalic, atraumatic Neck: No JVD or lymphadenopathy Cardiovascular: Regular rate and  rhythm Lungs: Regular rate and effort Abdomen: Soft, nontender, nondistended, no abdominal masses Back: No CVA tenderness Extremities: No edema Neurologic: Grossly intact  Laboratory Data:  No results found for this or any previous visit (from the past 24 hours). No results found for this or any previous visit (from the past 240 hours). Creatinine: Recent Labs    09/01/24 0834  CREATININE 0.98    Impression/Assessment:  BPH  Plan:  Proceed with aquablation. R/b discussed as previously documented  [place] JONETTA Edison, III 09/04/2024, 7:30 AM

## 2024-09-04 NOTE — Anesthesia Procedure Notes (Signed)
 Procedure Name: Intubation Date/Time: 09/04/2024 10:15 AM  Performed by: Carleton Garnette SAUNDERS, CRNAPre-anesthesia Checklist: Patient identified, Emergency Drugs available, Suction available, Patient being monitored and Timeout performed Patient Re-evaluated:Patient Re-evaluated prior to induction Oxygen Delivery Method: Circle system utilized Preoxygenation: Pre-oxygenation with 100% oxygen Induction Type: IV induction Ventilation: Two handed mask ventilation required and Oral airway inserted - appropriate to patient size Laryngoscope Size: Mac and 4 Grade View: Grade I Tube type: Oral Tube size: 7.5 mm Number of attempts: 1 Airway Equipment and Method: Stylet Placement Confirmation: ETT inserted through vocal cords under direct vision, positive ETCO2 and breath sounds checked- equal and bilateral Secured at: 23 cm Tube secured with: Tape Dental Injury: Teeth and Oropharynx as per pre-operative assessment

## 2024-09-04 NOTE — Transfer of Care (Signed)
 Immediate Anesthesia Transfer of Care Note  Patient: Christopher Hanson  Procedure(s) Performed: ABLATION, PROSTATE, TRANSURETHRAL, USING WATERJET  Patient Location: PACU  Anesthesia Type:General  Level of Consciousness: sedated  Airway & Oxygen Therapy: Patient Spontanous Breathing and Patient connected to face mask oxygen  Post-op Assessment: Report given to RN and Post -op Vital signs reviewed and stable  Post vital signs: Reviewed and stable  Last Vitals:  Vitals Value Taken Time  BP    Temp    Pulse 72 09/04/24 11:36  Resp 17 09/04/24 11:36  SpO2 96 % 09/04/24 11:36  Vitals shown include unfiled device data.  Last Pain:  Vitals:   09/04/24 0739  TempSrc: Oral  PainSc:          Complications: No notable events documented.

## 2024-09-04 NOTE — Discharge Instructions (Addendum)
 Transurethral Resection of the Prostate (TURP) or Greenlight laser ablation of the Prostate or Aquablation  Hold your Xarelto  until you have no bleeding for at least 2 days  Care After  Refer to this sheet in the next few weeks. These discharge instructions provide you with general information on caring for yourself after you leave the hospital. Your caregiver may also give you specific instructions. Your treatment has been planned according to the most current medical practices available, but unavoidable complications sometimes occur. If you have any problems or questions after discharge, please call your caregiver.  HOME CARE INSTRUCTIONS   Medications You may receive medicine for pain management. As your level of discomfort decreases, adjustments in your pain medicines may be made.  Take all medicines as directed.  You may be given a medicine (antibiotic) to kill germs following surgery. Finish all medicines. Let your caregiver know if you have any side effects or problems from the medicine.  If you are on aspirin, it would be best not to restart the aspirin until the blood in the urine clears Hygiene You can take a shower after surgery.  You should not take a bath while you still have the urethral catheter. Activity You will be encouraged to get out of bed as much as possible and increase your activity level as tolerated.  Spend the first week in and around your home. For 3 weeks, avoid the following:  Straining.  Running.  Strenuous work.  Walks longer than a few blocks.  Riding for extended periods.  Sexual relations.  Do not lift heavy objects (more than 20 pounds) for at least 1 month. When lifting, use your arms instead of your abdominal muscles.  You will be encouraged to walk as tolerated. Do not exert yourself. Increase your activity level slowly. Remember that it is important to keep moving after an operation of any type. This cuts down on the possibility of developing blood  clots.  Your caregiver will tell you when you can resume driving and light housework. Discuss this at your first office visit after discharge. Diet No special diet is ordered after a TURP. However, if you are on a special diet for another medical problem, it should be continued.  Normal fluid intake is usually recommended.  Avoid alcohol and caffeinated drinks for 2 weeks. They irritate the bladder. Decaffeinated drinks are okay.  Avoid spicy foods.  Bladder Function For the first 10 days, empty the bladder whenever you feel a definite desire. Do not try to hold the urine for long periods of time.  Urinating once or twice a night even after you are healed is not uncommon.  You may see some recurrence of blood in the urine after discharge from the hospital. This usually happens within 2 weeks after the procedure.If this occurs, force fluids again as you did in the hospital and reduce your activity.  Bowel Function You may experience some constipation after surgery. This can be minimized by increasing fluids and fiber in your diet. Drink enough water and fluids to keep your urine clear or pale yellow.  A stool softener may be prescribed for use at home. Do not strain to move your bowels.  If you are requiring increased pain medicine, it is important that you take stool softeners to prevent constipation. This will help to promote proper healing by reducing the need to strain to move your bowels.  Sexual Activity Semen movement in the opposite direction and into the bladder (retrograde ejaculation) may occur.  Since the semen passes into the bladder, cloudy urine can occur the first time you urinate after intercourse. Or, you may not have an ejaculation during erection. Ask your caregiver when you can resume sexual activity. Retrograde ejaculation and reduced semen discharge should not reduce one's pleasure of intercourse.  Postoperative Visit Arrange the date and time of your after surgery visit with  your caregiver.  Return to Work After your recovery is complete, you will be able to return to work and resume all activities. Your caregiver will inform you when you can return to work.    Foley Catheter Care A soft, flexible tube (Foley catheter) may have been placed in your bladder to drain urine and fluid. Follow these instructions: Taking Care of the Catheter Keep the area where the catheter leaves your body clean.  Attach the catheter to the leg so there is no tension on the catheter.  Keep the drainage bag below the level of the bladder, but keep it OFF the floor.  Do not take long soaking baths. Your caregiver will give instructions about showering.  Wash your hands before touching ANYTHING related to the catheter or bag.  Using mild soap and warm water on a washcloth:  Clean the area closest to the catheter insertion site using a circular motion around the catheter.  Clean the catheter itself by wiping AWAY from the insertion site for several inches down the tube.  NEVER wipe upward as this could sweep bacteria up into the urethra (tube in your body that normally drains the bladder) and cause infection.  Place a small amount of sterile lubricant at the tip of the penis where the catheter is entering.  Taking Care of the Drainage Bags Two drainage bags may be taken home: a large overnight drainage bag, and a smaller leg bag which fits underneath clothing.  It is okay to wear the overnight bag at any time, but NEVER wear the smaller leg bag at night.  Keep the drainage bag well below the level of your bladder. This prevents backflow of urine into the bladder and allows the urine to drain freely.  Anchor the tubing to your leg to prevent pulling or tension on the catheter. Use tape or a leg strap provided by the hospital.  Empty the drainage bag when it is 1/2 to 3/4 full. Wash your hands before and after touching the bag.  Periodically check the tubing for kinks to make sure there is  no pressure on the tubing which could restrict the flow of urine.  Changing the Drainage Bags Cleanse both ends of the clean bag with alcohol before changing.  Pinch off the rubber catheter to avoid urine spillage during the disconnection.  Disconnect the dirty bag and connect the clean one.  Empty the dirty bag carefully to avoid a urine spill.  Attach the new bag to the leg with tape or a leg strap.  Cleaning the Drainage Bags Whenever a drainage bag is disconnected, it must be cleaned quickly so it is ready for the next use.  Wash the bag in warm, soapy water.  Rinse the bag thoroughly with warm water.  Soak the bag for 30 minutes in a solution of white vinegar and water (1 cup vinegar to 1 quart warm water).  Rinse with warm water.  SEEK MEDICAL CARE IF:  You have chills or night sweats.  You are leaking around your catheter or have problems with your catheter. It is not uncommon to have sporadic leakage  around your catheter as a result of bladder spasms. If the leakage stops, there is not much need for concern. If you are uncertain, call your caregiver.  You develop side effects that you think are coming from your medicines.  SEEK IMMEDIATE MEDICAL CARE IF:  You are suddenly unable to urinate. Check to see if there are any kinks in the drainage tubing that may cause this. If you cannot find any kinks, call your caregiver immediately. This is an emergency.  You develop shortness of breath or chest pains.  Bleeding persists or clots develop in your urine.  You have a fever.  You develop pain in your back or over your lower belly (abdomen).  You develop pain or swelling in your legs.  Any problems you are having get worse rather than better.  MAKE SURE YOU:  Understand these instructions.  Will watch your condition.  Will get help right away if you are not doing well or get worse.

## 2024-09-04 NOTE — Op Note (Addendum)
 Preoperative diagnosis: BPH with lower urinary tract symptoms, weak stream, frequency  Postoperative diagnosis: Same   Procedure: Robotic water jet ablation of the prostate   Surgeon: Sherwood Edison, MD  Anesthesia: General   Indication for procedure:   Findings:  Cystoscopy revealed bilobar hypertrophy with high riding bladder neck  Description of procedure:  He was brought to the operating room and placed supine on the operating table.  After adequate anesthesia he was placed lithotomy position. Timeout was performed to confirm the patient and procedure. The TRUS Stepper was mounted to the Articulating Arm and secured to OR bed. The ultrasound probe was attached to the stepper. Exam under anesthesia was performed and the TRUS was inserted per rectum.  There was no resistance. The ultrasound probe was aligned, and confirmation made that the prostate is centered and aligned using both transverse and sagittal views. The bladder neck, verumontanum and the central/transition zones were identified.  Genitalia were prepped and draped in the usual sterile fashion.  Sounds were used to dilate the urethra from 20 [place] up to 30 [place].  The 28F AQUABEAM Handpiece is inserted into the prostatic urethra and a complete cystoscopic evaluation was performed by inspecting the prostate, bladder, and identifying the location of the verumontanum/external sphincter. The AQUABEAM Handpiece was secured to the Handpiece Articulating Arm. Confirmed alignment of AQUABEAM Handpiece and TRUS Probe to be parallel and colinear. Confirmation that AQUABEAM nozzle is centered and anterior of the bladder neck or the median lobe. The cystoscope was then retracted to visualize the verumontanum and external sphincter and the cystoscope tip was positioned just proximal to the external sphincter. Reconfirmed alignment of the TRUS probe with the AQUABEAM Handpiece and compression applied with TRUS probe. Horizontal alignment of the  Handpiece waterjet nozzle was performed. The Aquablation treatment zones were planned utilizing real-time TRUS to visualize the contour of the prostate and the depth and radial angles of resection were defined in the transverse view. In the sagittal view, the AQUABEAM nozzle is identified and position registered with software. The treatment contours were then adjusted to conform to the intended resection margins. The median lobe, bladder neck and verumontanum were marked and confirmed in the treatment contour. The Aquablation Treatment was then started following the resection contour confirmed under ultrasound guidance. TOTAL AQUABLATION RESECTION TIME: 6 minutes, 30 seconds  Once Aquablation resection was complete the 24 French aqua beam handpiece was carefully removed.  The continuous-flow sheath with the visual obturator was passed and then the loop and handle.  The trigone and the ureteral orifices were identified.  Resection of some of the residual median lobe and bladder neck tissue was done.  The bladder neck was identified at 6:00 and this was taken up to 12:00 with fulguration of the bladder neck and prostate for hemostasis.  Similarly from 6:00 up to 12:00 on the left side of the bladder neck was identified by resecting some of the ablated tissue to identify the bladder neck and cauterize any bleeding.  Some anterior tissue was resected.  This created excellent hemostasis.  All the chips were evacuated.  Ureteral orifices again identified and noted to be normal without injury.  The scope was backed out and a 22 [place] hematuria catheter was placed with 30 cc in the balloon.  The balloon was seated at the bladder neck and it was irrigated on light traction and noted to be clear to pink.  He was hooked up to CBI.  He was cleaned up and placed supine.  Catheter was placed on traction.  He was awakened and taken to the cover room in stable condition.  Complications: None  Blood loss: 100  mL  Specimens: None  Drains: 22 French three-way hematuria catheter with 30 cc in the balloon  Disposition: Patient stable to PACU

## 2024-09-05 DIAGNOSIS — N401 Enlarged prostate with lower urinary tract symptoms: Secondary | ICD-10-CM | POA: Diagnosis not present

## 2024-09-05 DIAGNOSIS — I480 Paroxysmal atrial fibrillation: Secondary | ICD-10-CM | POA: Diagnosis not present

## 2024-09-05 DIAGNOSIS — R3912 Poor urinary stream: Secondary | ICD-10-CM | POA: Diagnosis not present

## 2024-09-05 DIAGNOSIS — R350 Frequency of micturition: Secondary | ICD-10-CM | POA: Diagnosis not present

## 2024-09-05 DIAGNOSIS — I10 Essential (primary) hypertension: Secondary | ICD-10-CM | POA: Diagnosis not present

## 2024-09-05 DIAGNOSIS — Z6841 Body Mass Index (BMI) 40.0 and over, adult: Secondary | ICD-10-CM | POA: Diagnosis not present

## 2024-09-05 LAB — SURGICAL PATHOLOGY

## 2024-09-05 NOTE — Progress Notes (Signed)
   09/05/24 0031  BiPAP/CPAP/SIPAP  $ Non-Invasive Home Ventilator  Initial  BiPAP/CPAP/SIPAP Pt Type Adult  BiPAP/CPAP/SIPAP Resmed  Mask Type Nasal mask  Dentures removed? Not applicable  Respiratory Rate 16 breaths/min  FiO2 (%) 21 %  Patient Home Machine No  Patient Home Mask Yes  Patient Home Tubing Yes  Auto Titrate Yes  Minimum cmH2O 8 cmH2O  Maximum cmH2O 20 cmH2O  CPAP/SIPAP surface wiped down Yes  Device Plugged into [employer] Power Outlet Yes

## 2024-09-05 NOTE — TOC Transition Note (Signed)
 Transition of Care [facility]) - Discharge Note   Patient Details  Name: Christopher Hanson MRN: [id number] Date of Birth: 1951-05-15  Transition of Care [facility]) CM/SW Contact:  Bascom Service, RN Phone Number: 09/05/2024, 11:24 AM   Clinical Narrative: d/c home with f/c. Has own transport home.      Final next level of care: Home/Self Care Barriers to Discharge: No Barriers Identified   Patient Goals and CMS Choice Patient states their goals for this hospitalization and ongoing recovery are:: Home CMS Medicare.gov Compare Post Acute Care list provided to:: Patient Choice offered to / list presented to : Patient      Discharge Placement                       Discharge Plan and Services Additional resources added to the After Visit Summary for                                       Social Drivers of Health (SDOH) Interventions SDOH Screenings   Tobacco Use: Low Risk  (09/04/2024)     Readmission Risk Interventions     No data to display

## 2024-09-05 NOTE — Care Management Obs Status (Signed)
 MEDICARE OBSERVATION STATUS NOTIFICATION   Patient Details  Name: Christopher Hanson MRN: [id number] Date of Birth: [birth year]   Medicare Observation Status Notification Given:  Yes    MahabirNathanel, RN 09/05/2024, 11:22 AM

## 2024-09-05 NOTE — Progress Notes (Signed)
 AVS reviewed with patient, no questions, IV removed, CBI already stopped by primary RN, Foley catheter plugged.

## 2024-09-05 NOTE — Discharge Summary (Signed)
 Patient ID: Christopher Hanson MRN: [id number] DOB/AGE: 04-14-1951 73 y.o.  Admit date: 09/04/2024 Discharge date: 09/05/2024  Primary Care Physician:  [name], MD  Discharge Diagnoses:   Present on Admission:  BPH (benign prostatic hyperplasia)  Discharge Medications: Allergies as of 09/05/2024       Reactions   Lisinopril  Cough        Medication List     STOP taking these medications    rivaroxaban  20 MG Tabs tablet Commonly known as: Xarelto        TAKE these medications    acetaminophen  500 MG tablet Commonly known as: TYLENOL  Take 1,000 mg by mouth every 6 (six) hours as needed for moderate pain (pain score 4-6).   azelastine 0.1 % nasal spray Commonly known as: ASTELIN Place 1 spray into both nostrils as needed for rhinitis or allergies.   calcium carbonate 500 MG chewable tablet Commonly known as: TUMS - dosed in mg elemental calcium Chew 1 tablet by mouth as needed for indigestion or heartburn.   calcium gluconate 500 MG tablet Take 1 tablet by mouth daily. Calcium, Magnesium , zinc  and Vitamin D in it.   dofetilide  500 MCG capsule Commonly known as: TIKOSYN  Take 1 capsule (500 mcg total) by mouth 2 (two) times daily.   ezetimibe  10 MG tablet Commonly known as: ZETIA  Take 10 mg by mouth at bedtime.   finasteride 5 MG tablet Commonly known as: PROSCAR Take 5 mg by mouth daily.   HYDROcodone -acetaminophen  5-325 MG tablet Commonly known as: NORCO/VICODIN Take 1 tablet by mouth every 6 (six) hours as needed.   Mens Multivitamin Tabs Take 1 tablet by mouth daily.   metoprolol  succinate 50 MG 24 hr tablet Commonly known as: TOPROL -XL TAKE 1 AND 1/2 TABLETS(75 MG) BY MOUTH DAILY   metoprolol  tartrate 25 MG tablet Commonly known as: LOPRESSOR  Take 1 tablet (25 mg total) by mouth every 6 (six) hours as needed (PALPITATIONS).   omeprazole 40 MG capsule Commonly known as: PRILOSEC Take 40 mg by mouth 4 (four) times a week.   potassium  chloride 10 MEQ tablet Commonly known as: KLOR-CON  TAKE 1 TABLET(10 MEQ) BY MOUTH DAILY   simvastatin  40 MG tablet Commonly known as: ZOCOR  Take 40 mg by mouth at bedtime.   tamsulosin  0.4 MG Caps capsule Commonly known as: FLOMAX  Take 0.4 mg by mouth in the morning and at bedtime.   valsartan  80 MG tablet Commonly known as: DIOVAN  Take 1 tablet (80 mg total) by mouth daily.   VITAMIN D3 PO Take 2,000 Units by mouth every morning.         Significant Diagnostic Studies:  No results found.  Brief H and P: For complete details please refer to admission H and P, but in brief patient presented on 09/04/2024 for scheduled robotic water jet ablation of prostate with Dr. Carolee.  Procedure was completed without complication and patient was discharged to the floor for continuous bladder irrigation, pain management, and monitoring overnight.  On rounds this morning patient was alert, oriented, in no distress.  He had been ambulating without difficulty.  CBI was on slow gtt. with very light pink irrigant in tubing.  Shared decision was made to proceed with discharge. Causes for concern were reviewed with patient and they expressed understanding.  Close follow-up with Dr. Carolee for voiding trial the day after tomorrow.  Hospital Course:  Principal Problem:   BPH (benign prostatic hyperplasia)   Day of Discharge BP 122/62 (BP Location: Right Arm)   Pulse ROLLEN)  53   Temp 98.1 F (36.7 C) (Oral)   Resp 18   Ht 6' (1.829 m)   Wt (!) 178 kg   SpO2 97%   BMI 53.22 kg/m   Results for orders placed or performed during the hospital encounter of 09/04/24 (from the past 24 hours)  Blood transfusion report - scanned     Status: None ()   Collection Time: 09/05/24  9:47 AM   Narrative   Ordered by an unspecified provider.  Blood transfusion report - scanned     Status: None   Collection Time: 09/05/24  9:47 AM   Narrative   Ordered by an unspecified provider.    Physical Exam: General:  Alert and awake oriented x3 not in any acute distress. CVS: no cyanosis Chest: normal rate and work of breathing Abdomen: soft nontender, non-distended   Disposition:  Home this am  Diet:  Regular  Activity:  slowly as tolerated  DISCHARGE FOLLOW-UP  Thursday voiding trial in clinic  Time spent on Discharge:  A total of 37 minutes was spent in planning and discharge  Signed: OLE ORN Nessie Nong 09/05/2024, 11:06 AM

## 2024-09-07 DIAGNOSIS — R3915 Urgency of urination: Secondary | ICD-10-CM | POA: Diagnosis not present

## 2024-09-07 DIAGNOSIS — N401 Enlarged prostate with lower urinary tract symptoms: Secondary | ICD-10-CM | POA: Diagnosis not present

## 2024-09-17 ENCOUNTER — Other Ambulatory Visit: Payer: Self-pay | Admitting: Cardiology

## 2024-09-20 ENCOUNTER — Telehealth: Payer: Self-pay | Admitting: Cardiology

## 2024-09-20 MED ORDER — DOFETILIDE 500 MCG PO CAPS: 500.0000 ug | ORAL_CAPSULE | Freq: Two times a day (BID) | ORAL | 1 refills | Status: DC

## 2024-09-20 NOTE — Telephone Encounter (Signed)
 Pt's medication was sent to pt's pharmacy as requested. Confirmation received.

## 2024-09-20 NOTE — Telephone Encounter (Signed)
*  STAT* If patient is at the pharmacy, call can be transferred to refill team.   1. Which medications need to be refilled? (please list name of each medication and dose if known) dofetilide  (TIKOSYN ) 500 MCG capsule   2. Which pharmacy/location (including street and city if local pharmacy) is medication to be sent to? [facility] [id number] - [place], [place] - [address]   3. Do they need a 30 day or 90 day supply? 90  Patient has NP on 12/12

## 2024-09-21 ENCOUNTER — Ambulatory Visit: Admitting: Podiatry

## 2024-09-21 DIAGNOSIS — Z23 Encounter for immunization: Secondary | ICD-10-CM | POA: Diagnosis not present

## 2024-09-29 ENCOUNTER — Encounter: Payer: Self-pay | Admitting: Podiatry

## 2024-09-29 ENCOUNTER — Ambulatory Visit (INDEPENDENT_AMBULATORY_CARE_PROVIDER_SITE_OTHER): Admitting: Podiatry

## 2024-09-29 DIAGNOSIS — D689 Coagulation defect, unspecified: Secondary | ICD-10-CM | POA: Diagnosis not present

## 2024-09-29 DIAGNOSIS — M79674 Pain in right toe(s): Secondary | ICD-10-CM | POA: Diagnosis not present

## 2024-09-29 DIAGNOSIS — I10 Essential (primary) hypertension: Secondary | ICD-10-CM | POA: Diagnosis not present

## 2024-09-29 DIAGNOSIS — I480 Paroxysmal atrial fibrillation: Secondary | ICD-10-CM | POA: Diagnosis not present

## 2024-09-29 DIAGNOSIS — E7800 Pure hypercholesterolemia, unspecified: Secondary | ICD-10-CM | POA: Diagnosis not present

## 2024-09-29 DIAGNOSIS — B351 Tinea unguium: Secondary | ICD-10-CM

## 2024-09-29 DIAGNOSIS — M79675 Pain in left toe(s): Secondary | ICD-10-CM | POA: Diagnosis not present

## 2024-09-29 NOTE — Progress Notes (Signed)
 This patient returns to my office for at risk foot care.  This patient requires this care by a professional since this patient will be at risk due to having coagulation defect due to taking xarelto .  This patient is unable to cut nails himself since the patient cannot reach his nails.These nails are painful walking and wearing shoes. Patient also has fissures on both heels due to his callus. This patient presents for at risk foot care today.  General Appearance  Alert, conversant and in no acute stress.  Vascular  Dorsalis pedis and posterior tibial  pulses are weakly  palpable  bilaterally.  Capillary return is within normal limits  bilaterally. Temperature is within normal limits  bilaterally.  Neurologic  Senn-Weinstein monofilament wire test within normal limits  bilaterally. Muscle power within normal limits bilaterally.  Nails Thick disfigured discolored nails with subungual debris  from hallux to fifth toes bilaterally. No evidence of bacterial infection or drainage bilaterally.  Orthopedic  No limitations of motion  feet .  No crepitus or effusions noted.  No bony pathology or digital deformities noted.  Skin  normotropic skin with no porokeratosis noted bilaterally.  No signs of infections or ulcers noted.     Onychomycosis  Pain in right toes  Pain in left toes   Consent was obtained for treatment procedures.   Mechanical debridement of nails 1-5  bilaterally performed with a nail nipper.  Filed with dremel without incident.    Return office visit    3 months                  Told patient to return for periodic foot care and evaluation due to potential at risk complications.   Ruffin Cotton DPM

## 2024-10-05 ENCOUNTER — Telehealth (HOSPITAL_BASED_OUTPATIENT_CLINIC_OR_DEPARTMENT_OTHER): Payer: Self-pay

## 2024-10-05 DIAGNOSIS — Z7901 Long term (current) use of anticoagulants: Secondary | ICD-10-CM | POA: Diagnosis not present

## 2024-10-05 DIAGNOSIS — K219 Gastro-esophageal reflux disease without esophagitis: Secondary | ICD-10-CM | POA: Diagnosis not present

## 2024-10-05 DIAGNOSIS — Z86018 Personal history of other benign neoplasm: Secondary | ICD-10-CM | POA: Diagnosis not present

## 2024-10-05 DIAGNOSIS — I4891 Unspecified atrial fibrillation: Secondary | ICD-10-CM | POA: Diagnosis not present

## 2024-10-05 DIAGNOSIS — Z23 Encounter for immunization: Secondary | ICD-10-CM | POA: Diagnosis not present

## 2024-10-05 NOTE — Telephone Encounter (Signed)
   Pre-operative Risk Assessment    Patient Name: Christopher Hanson  DOB: 01/05/1951 MRN: [id number]   Date of last office visit: 08/10/2024 - Charlies Arthur, PA-C Date of next office visit: 11/10/2024 - Dr. Anner   Request for Surgical Clearance    Procedure:  colonoscopy  Date of Surgery:  Clearance TBD                                 Surgeon:  Dr. Elsie Cree Surgeon's Group or Practice Name:  [facility]  Phone number:  [phone] Fax number:  [phone]   Type of Clearance Requested:   - Medical  - Pharmacy:  Hold Rivaroxaban  (Xarelto ) - Pt restarted on Xarelto  - Needs instructions on when to hold   Type of Anesthesia:  Propofol    Additional requests/questions:  Clearance sent asking if it was OK to hold Eliquis 2 days prior to procedure. Pt has never been on Eliquis but Xarelto  was on his reconciliation medication list. I then read in the hospital discharge summary from 09/05/24 that Xarelto  was discontinued. I called pt to inquire about his blood thinner. He was instructed to restart Xarelto  and takes it daily as prescribed. After hanging up, I spoke with my supervisor and she asked me to call him back to ask who restarted the Xarelto  and when. LM for him to call back or said he could send a MyChart msg with that information.   SignedPatrcia Hong L   10/05/2024, 12:35 PM

## 2024-10-05 NOTE — Telephone Encounter (Signed)
 Pt called back and said his wife was concerned about him being off the Xarelto  for so long due to afib and stroke risk so they talked to [facility] and they left it up to him whether or not to start it back. Per Christopher Hanson, he started back about 3 weeks post op (approximately 09/25/2024). He stated that he is still seeing a trace amount of blood in his urine and has a follow up with [facility] on 10/19/2024. He stated he told [name] that he is not in a rush for this colonoscopy. I informed him I would send this message to triage and PreOp.

## 2024-10-06 NOTE — Telephone Encounter (Signed)
 Will still need to know who prescribed the Xarelto , so that if cardiology needs to address, we can make recommendations.  He has an appt with Dr.Harding on 11/10/2024 as a NEW PATIENT. Perhaps Dr. Anner can address this as well. I put this info in the appt notes for Dr. Anner.   Thank you for the information.  KL

## 2024-10-09 DIAGNOSIS — R3915 Urgency of urination: Secondary | ICD-10-CM | POA: Diagnosis not present

## 2024-10-09 DIAGNOSIS — N3941 Urge incontinence: Secondary | ICD-10-CM | POA: Diagnosis not present

## 2024-10-09 DIAGNOSIS — N401 Enlarged prostate with lower urinary tract symptoms: Secondary | ICD-10-CM | POA: Diagnosis not present

## 2024-10-09 DIAGNOSIS — R300 Dysuria: Secondary | ICD-10-CM | POA: Diagnosis not present

## 2024-10-19 DIAGNOSIS — N401 Enlarged prostate with lower urinary tract symptoms: Secondary | ICD-10-CM | POA: Diagnosis not present

## 2024-10-19 DIAGNOSIS — R3915 Urgency of urination: Secondary | ICD-10-CM | POA: Diagnosis not present

## 2024-10-29 DIAGNOSIS — E7800 Pure hypercholesterolemia, unspecified: Secondary | ICD-10-CM | POA: Diagnosis not present

## 2024-10-29 DIAGNOSIS — I10 Essential (primary) hypertension: Secondary | ICD-10-CM | POA: Diagnosis not present

## 2024-10-29 DIAGNOSIS — I480 Paroxysmal atrial fibrillation: Secondary | ICD-10-CM | POA: Diagnosis not present

## 2024-11-10 ENCOUNTER — Ambulatory Visit: Attending: Cardiology | Admitting: Cardiology

## 2024-11-10 ENCOUNTER — Encounter: Payer: Self-pay | Admitting: Cardiology

## 2024-11-10 VITALS — BP 138/68 | HR 68 | Ht 72.0 in | Wt >= 6400 oz

## 2024-11-10 DIAGNOSIS — D6869 Other thrombophilia: Secondary | ICD-10-CM | POA: Diagnosis not present

## 2024-11-10 DIAGNOSIS — Z6841 Body Mass Index (BMI) 40.0 and over, adult: Secondary | ICD-10-CM | POA: Diagnosis not present

## 2024-11-10 DIAGNOSIS — I10 Essential (primary) hypertension: Secondary | ICD-10-CM | POA: Diagnosis not present

## 2024-11-10 DIAGNOSIS — G4733 Obstructive sleep apnea (adult) (pediatric): Secondary | ICD-10-CM | POA: Diagnosis not present

## 2024-11-10 DIAGNOSIS — I4819 Other persistent atrial fibrillation: Secondary | ICD-10-CM | POA: Diagnosis not present

## 2024-11-10 DIAGNOSIS — E7849 Other hyperlipidemia: Secondary | ICD-10-CM

## 2024-11-10 DIAGNOSIS — I480 Paroxysmal atrial fibrillation: Secondary | ICD-10-CM | POA: Diagnosis not present

## 2024-11-10 DIAGNOSIS — R600 Localized edema: Secondary | ICD-10-CM | POA: Diagnosis not present

## 2024-11-10 MED ORDER — VALSARTAN 160 MG PO TABS: 160.0000 mg | ORAL_TABLET | Freq: Every day | ORAL | 3 refills | Status: DC

## 2024-11-10 NOTE — Patient Instructions (Signed)
 Medication Instructions:    Increase Valsartan  to 160 mg  daily   *If you need a refill on your cardiac medications before your next appointment, please call your pharmacy*   Lab Work: Not needed If you have labs (blood work) drawn today and your tests are completely normal, you will receive your results only by: MyChart Message (if you have MyChart) OR A paper copy in the mail If you have any lab test that is abnormal or we need to change your treatment, we will call you to review the results.   Testing/Procedures: Not needed   Follow-Up: At [facility], you and your health needs are our priority.  As part of our continuing mission to provide you with exceptional heart care, we have created designated Provider Care Teams.  These Care Teams include your primary Cardiologist (physician) and Advanced Practice Providers (APPs -  Physician Assistants and Nurse Practitioners) who all work together to provide you with the care you need, when you need it.     Your next appointment:   12 month(s)  The format for your next appointment:   In Person  Provider:   Alm Clay, MD   Other Instructions

## 2024-11-10 NOTE — Progress Notes (Unsigned)
 Cardiology Office Note:  .   Date:  11/14/2024  ID:  Christopher Hanson, DOB 10-Jun-1951, MRN [id number] PCP: [name], MD  [facility] Providers Cardiologist:  Alm Clay, MD Electrophysiologist:  OLE ONEIDA HOLTS, MD     Chief Complaint  Patient presents with   Follow-up    Over 3-year follow-up with me.   Atrial Fibrillation    As far as he can tell no breakthrough spells.   Hypertension    BP higher than usual.    Patient Profile: .     Christopher Hanson is a morbidly obese 73 y.o. male with a PMH noted below who presents here for very delayed primary cardiology follow-up.   PMH: PAF (March 2020) -> not ablation candidate (severe LA dilation and extreme obesity) Unsuccessful DCCV May 2021 June 2021-started on Tikosyn  along with Toprol  XL 100 mg DCCV 04/2020 June 2024 noted having 7 hours of A-fib; May 2025 noted 1 episode of fast heart rate lasting several hours.  His monitor not called A-fib.  He had missed his morning meds.  Gave prescription for PRN Lopressor  for tachycardia. Morbid obesity OSA: CPAP HTN HLD BLE edema     I last saw Christopher Hanson in February 2022: He noted some exertional dyspnea if he would go too fast.  Off-and-on chest discomfort occurring at rest little spurts of 30 to 45 seconds.  Also little heartbeat irregularities for short spells but nothing prolonged since cardioversion noted improved energy with less dyspnea since converting sinus rhythm.  Poor balance-BP controlled  He was last seen by Dr. Holts in February 2024.  Feeling well.  Compliant with medications.  No palpitations.  No changes.   His most recent visit was with Charlies Arthur, PA on August 10, 2024: Dealing with prostate issues.  Vague left-sided chest pain randomly occurring 8 to 10 seconds.  No symptoms of A-fib.  No dyspnea unless he walks up hills or up stairs.  No dizziness or wooziness syncope/near syncope.  No bleeding.  Continue to stressed importance of taking  meds as prescribed most notably Tikosyn  twice daily.  Subjective  Discussed the use of AI scribe software for clinical note transcription with the patient, who gave verbal consent to proceed.  History of Present Illness Christopher Hanson is a 73 year old male with atrial fibrillation, hypertension, and hyperlipidemia who presents for follow-up of his cardiac conditions.  He has a history of atrial fibrillation and is currently on Tikosyn  (dofetilide ) 500 mcg twice a day and Xarelto  (rivaroxaban ). He was off Xarelto  for a procedure in early October but resumed it due to anxiety about clot formation. The patient reports that he had an episode of atrial fibrillation associated with a sinus infection in April, which improved after taking antibiotics.  His hypertension is managed with valsartan  80 mg and metoprolol  succinate 50 mg daily. Blood pressure usually ranges from 130s to 140s over 74 to 84 mmHg, but it was higher in early November. He experiences occasional ankle swelling, which usually goes down when he puts his feet up.  For hyperlipidemia, he takes simvastatin  40 mg and Zetia  10 mg. Cholesterol levels were last checked in June, showing total cholesterol of 140 mg/dL, HDL 42 mg/dL, LDL 75 mg/dL, and triglycerides 867 mg/dL.  He has a history of sleep apnea and reports using a CPAP machine, which does not bother him.  He has a history of weight loss efforts, having lost about 80 pounds through diet, exercise, and participation in a  weight loss medication study. However, he stopped exercising due to a bicep tear and now leads a more sedentary lifestyle, contributing to weight gain.  He takes Prilosec four times a week for gastrointestinal symptoms and Proscar  once a day for urinary symptoms, which helps with stinging or burning. He recently started Gentessa for overactive bladder symptoms.  No chest pain, pressure, tightness, shortness of breath, stroke symptoms, blood in stools, tarry  stools, blood in urine, or heart racing episodes.     Objective   Medications - Tikosyn  (dofetide) 500 twice a day; - Metoprolol  succinate 50 mg-1-1/2 tabs daily; - Xarelto  (rivaroxaban ) 20 mg daily - Valsartan  80 mg - Simvastatin  40 mg;- Zetia  10 mg - Prilosec 40 mg daily 4 days a week - Proscar  5 mg daily   Studies Reviewed: SABRA   EKG Interpretation Date/Time:  Friday November 10 2024 11:31:15 EST Ventricular Rate:  67 PR Interval:  198 QRS Duration:  102 QT Interval:  430 QTC Calculation: 454 R Axis:   3  Text Interpretation: Normal sinus rhythm Normal ECG When compared with ECG of 10-Aug-2024 08:11, Sinus rhythm has replaced Ectopic atrial rhythm Confirmed by Anner Lenis ([id number]) on 11/10/2024 12:31:29 PM    No results found for: CHOL, HDL, LDLCALC, LDLDIRECT, TRIG, CHOLHDL Lab Results  Component Value Date   NA 143 09/01/2024   CL 109 09/01/2024   K 4.5 09/01/2024   CO2 24 09/01/2024   BUN 18 09/01/2024   CREATININE 0.98 09/01/2024   GFRNONAA >60 09/01/2024   CALCIUM 9.1 09/01/2024   ALBUMIN 4.1 10/08/2020   GLUCOSE 105 (H) 09/01/2024   Results LABS Total cholesterol: 140 (05/25/2024) HDL: 42 (05/25/2024) LDL: 75 (05/25/2024) Triglycerides: 132 (05/25/2024) Creatinine: 0.98 (08/2024) TSH: 1.09 (04/2024)  Echo 02/20/2020: EF 55-60%. No RWMA. Mod LVH. Unable to determine Diastolic Pressrue. Severe LA dilation, Mod RA dilation. Mildly dilated SVC - suggests RAP/CVP ~8 mmHg.   Risk Assessment/Calculations:    CHA2DS2-VASc Score = 2   This indicates a 2.2% annual risk of stroke. The patient's score is based upon: CHF History: 0 HTN History: 1 Diabetes History: 0 Stroke History: 0 Vascular Disease History: 0 Age Score: 1 Gender Score: 0         Physical Exam:   VS:  BP 138/68   Pulse 68   Ht 6' (1.829 m)   Wt (!) 410 lb (186 kg)   SpO2 93%   BMI 55.61 kg/m    Wt Readings from Last 3 Encounters:  11/10/24 (!) 410 lb (186 kg)   09/04/24 (!) 392 lb 6.7 oz (178 kg)  09/01/24 (!) 393 lb (178.3 kg)    GEN: Well groomed; in no acute distress; super morbidly obese NECK: No JVD; No carotid bruits CARDIAC: Very distant heart sounds-but mostly normal S1, S2; RRR, no murmurs, rubs, gallops RESPIRATORY:  Clear to auscultation without rales, wheezing or rhonchi ; nonlabored, good air movement. ABDOMEN: Soft, non-tender, non-distended EXTREMITIES: 2+ bilateral lower extremity edema; No deformity     ASSESSMENT AND PLAN: .    Problem List Items Addressed This Visit       Cardiology Problems   Essential hypertension (Chronic)   Home blood pressure 130s-140s/74-84 mmHg. Office reading elevated. On valsartan  80 mg daily. Ankle swelling likely due to inactivity. - Increase valsartan  to 160 mg daily, and continue current dose of Toprol  75 mg daily - Monitor blood pressure regularly. - Consider weight loss medications to aid in blood pressure control.  Relevant Orders   EKG 12-Lead (Completed)   Hyperlipidemia due to dietary fat intake (Chronic)   Labs from June 2025 showed LDL of 75.  Pretty well-controlled on current dose of simvastatin  40 mg.      Paroxysmal (persistent) atrial fibrillation (HCC) -CHA2DS2-VASc score 3 (HTN, 69, Aortic Atherosclerosis noted on CT) - Primary (Chronic)   Managed with Tikosyn  and Xarelto . No recent rapid heart rate or stroke symptoms.  CPAP for sleep apnea. - Continue Tikosyn  500 mcg twice daily. - Continue Xarelto  for anticoagulation. - Continue CPAP for sleep apnea. - Consider weight loss medications such as Mounjaro or [id number] for weight management and potential improvement in AFib and sleep apnea.      Relevant Orders   EKG 12-Lead (Completed)     Other   Bilateral leg edema (Chronic)   Relatively stable.  Try to avoid diuretic in the setting of Tikosyn  use to avoid electrolyte normalities.  Foot elevation, support stockings.      Morbid obesity with BMI of 50.0-59.9,  adult (HCC) (Chronic)   Super morbid obesity with a BMI over 50.  Would definitely benefit from GLP-1 agonist. Morbid obesity with OSA - Consider weight loss medications such as Mounjaro or Wegovy for weight management and potential improvement in AFib and sleep apnea.      OSA on CPAP (Chronic)   - Continue CPAP for sleep apnea. - Consider weight loss medications such as Mounjaro or [id number] for weight management and potential improvement in AFib and sleep apnea.      Secondary hypercoagulable state   On Xarelto  20 mg daily for A-fib.   Okay to hold Xarelto  2 to 3 days preop for surgeries or procedures.            Follow-Up: Return in about 1 year (around 11/10/2025) for Routine follow up with me, [facility] [address] obesity follow-up with CV[facility].  I spent 42 minutes in the care of Christopher Hanson today including reviewing labs (1 minute), reviewing outside labs from PCP via [facility] (1 minute), reviewing studies (echo reviewed-3 minutes), face to face time discussing treatment options (18 minutes), reviewing records from multiple EP visits since my last visit-has been 3+ years.  (8 minutes), 11 minutes dictating., and documenting in the encounter.      Signed, Alm MICAEL Clay, MD, MS Alm Clay, M.D., M.S. Interventional Cardiologist  [facility] Pager # [phone]

## 2024-11-13 MED ORDER — METOPROLOL TARTRATE 25 MG PO TABS: 25.0000 mg | ORAL_TABLET | Freq: Four times a day (QID) | ORAL | 1 refills | Status: AC | PRN

## 2024-11-13 MED ORDER — VALSARTAN 160 MG PO TABS: 160.0000 mg | ORAL_TABLET | Freq: Every day | ORAL | 3 refills | Status: AC

## 2024-11-13 MED ORDER — METOPROLOL SUCCINATE ER 50 MG PO TB24: 75.0000 mg | ORAL_TABLET | Freq: Every day | ORAL | 3 refills | Status: AC

## 2024-11-13 MED ORDER — POTASSIUM CHLORIDE ER 10 MEQ PO TBCR: 10.0000 meq | EXTENDED_RELEASE_TABLET | Freq: Every day | ORAL | 3 refills | Status: AC

## 2024-11-13 MED ORDER — DOFETILIDE 500 MCG PO CAPS: 500.0000 ug | ORAL_CAPSULE | Freq: Two times a day (BID) | ORAL | 3 refills | Status: AC

## 2024-11-14 ENCOUNTER — Encounter: Payer: Self-pay | Admitting: Cardiology

## 2024-11-14 DIAGNOSIS — G4733 Obstructive sleep apnea (adult) (pediatric): Secondary | ICD-10-CM | POA: Insufficient documentation

## 2024-11-14 NOTE — Assessment & Plan Note (Signed)
-   Continue CPAP for sleep apnea. - Consider weight loss medications such as Mounjaro or [id number] for weight management and potential improvement in AFib and sleep apnea.

## 2024-11-14 NOTE — Assessment & Plan Note (Signed)
 On Xarelto  20 mg daily for A-fib.   Okay to hold Xarelto  2 to 3 days preop for surgeries or procedures.

## 2024-11-14 NOTE — Assessment & Plan Note (Signed)
 Super morbid obesity with a BMI over 50.  Would definitely benefit from GLP-1 agonist. Morbid obesity with OSA - Consider weight loss medications such as Mounjaro or Wegovy for weight management and potential improvement in AFib and sleep apnea.

## 2024-11-14 NOTE — Assessment & Plan Note (Signed)
 Managed with Tikosyn  and Xarelto . No recent rapid heart rate or stroke symptoms.  CPAP for sleep apnea. - Continue Tikosyn  500 mcg twice daily. - Continue Xarelto  for anticoagulation. - Continue CPAP for sleep apnea. - Consider weight loss medications such as Mounjaro or [id number] for weight management and potential improvement in AFib and sleep apnea.

## 2024-11-14 NOTE — Assessment & Plan Note (Signed)
 Home blood pressure 130s-140s/74-84 mmHg. Office reading elevated. On valsartan  80 mg daily. Ankle swelling likely due to inactivity. - Increase valsartan  to 160 mg daily, and continue current dose of Toprol  75 mg daily - Monitor blood pressure regularly. - Consider weight loss medications to aid in blood pressure control.

## 2024-11-14 NOTE — Assessment & Plan Note (Signed)
 Relatively stable.  Try to avoid diuretic in the setting of Tikosyn  use to avoid electrolyte normalities.  Foot elevation, support stockings.

## 2024-11-14 NOTE — Assessment & Plan Note (Signed)
 Labs from June 2025 showed LDL of 75.  Pretty well-controlled on current dose of simvastatin  40 mg.

## 2024-11-15 ENCOUNTER — Telehealth: Payer: Self-pay | Admitting: Pharmacy Technician

## 2024-11-15 ENCOUNTER — Other Ambulatory Visit (HOSPITAL_COMMUNITY): Payer: Self-pay

## 2024-11-15 NOTE — Telephone Encounter (Addendum)
 Pharmacy Patient Advocate Encounter   Received notification from Physician's Office that prior authorization for zepbound is required/requested.   Insurance verification completed.   The patient is insured through [employer].   Per test claim: PA required; PA submitted to above mentioned insurance via Latent Key/confirmation #/EOC [id number] Status is pending    Waiting for CMM to come back up

## 2024-11-16 NOTE — Telephone Encounter (Signed)
 Pharmacy Patient Advocate Encounter  Received notification from [employer] that Prior Authorization for zepbound has been DENIED.  Full denial letter will be uploaded to the media tab. See denial reason below.   PA #/Case ID/Reference #: [id number]

## 2024-11-17 NOTE — Telephone Encounter (Deleted)
 The cash price at a local pharmacy is around 1000 dollars a month. The manufacturer provides a discount if you order it directly from them.  Here are their price breakdowns:  Zepbound 2.5 mg: Pay as low as $299/month* Zepbound 5 mg: Pay as low as $399/month* Zepbound 7.5 mg, 10 mg, 12.5 mg, and 15 mg: Pay as low as $449/month*   Here is the website: [email or web address]

## 2024-12-04 ENCOUNTER — Telehealth: Payer: Self-pay | Admitting: Pharmacy Technician

## 2024-12-04 ENCOUNTER — Other Ambulatory Visit (HOSPITAL_COMMUNITY): Payer: Self-pay

## 2024-12-04 NOTE — Telephone Encounter (Signed)
 Pharmacy Patient Advocate Encounter   Received notification from Physician's Office - chris p that prior authorization for zepbound is required/requested.   Insurance verification completed.   The patient is insured through [facility].   Per test claim: PA required; PA submitted to above mentioned insurance via Latent Key/confirmation #/EOC [id number] Status is pending

## 2024-12-04 NOTE — Telephone Encounter (Signed)
 Insurance requries an appeal :

## 2024-12-12 NOTE — Progress Notes (Unsigned)
 "    Cardiology Office Note Date:  12/12/2024  Patient ID:  Christopher Hanson, DOB October 09, 1951, MRN [id number] PCP:  [name], MD  Cardiologist:  Dr.Harding Electrophysiologist: Dr. Cindie    Chief Complaint:   4 mo  History of Present Illness: Christopher Hanson is a 74 y.o. male with history of  HTN, BPH, sleep apnea, obesity,  AFib  Saw Dr. Cindie Feb 2024  Seeing myself since then  I saw him 12/09/23 He feels well Thinks maybe 6 mo ago he had about 7 hours of Afib, none since He will occasionally miss an AM dose of Tikosyn  (he thinks), otherwise reports good medication compliance No CP, SOB No dizziness, near syncope or syncope Ruptured R bicep and has not been to the gym in a while Is looking to participate in another weight loss trial Stable QT Long discussion with trial team regarding the importance that they are sure no issues/contraindications to Tikosyn   He reached out Feb with palpitations, erratic HRs, elevated BPs, flushed, lightheaded >> settled, no changes made   I saw him 04/07/24 He is doing well Yesterday though he missed his Toprol  dose and 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 Lasted some hours and settled, did not realized he missed his AM meds until that evening when he went to take his PM Meds This has happened once in the past that he recalls about 6 mo ago, uncertain if that time he missed meds Typically very good about his med compliance He did not enter the trial he spoke of before but considering another, we again spoke of the importance that the investigation drug is OK with his Tikosyn . No CP, SOB, DOE No near syncope or syncope No bleeding, signs of bleeding Busy, has a lot of hobbies, likes to ride his motorcycle, plays in 2 band, plus church, collects trains, and does woodworking No formal  exercise  Rx PRN lopressor    I saw him 08/10/24 He is doing well Seeing urology for his prostate, pending an aqua ablation >> not yet scheduled, had an insurance hold up He mentions a vague discomfort to he chest, is L sided, random,not exertional, no radiation or associated symptoms, lasts perhaps 8 -10 seconds No CP otherwise None when he is working in his shop, playing his guitar, active. Denies symptoms of AFib No SOB at rest or with ADLs, reports able to walk easily on flat ground without limitations No dizzy spells, near syncope or syncope No bleeding or signs of bleeding BP is elevated > has not yet taken his AM medications, reported better at home Stable QTc, no changes made  He aw Dr. Anner 11/10/24  AFib/AAD hx Dx Feb 2021 Tikosyn  started June 2021  Past Medical History:  Diagnosis Date   Arthritis    GERD (gastroesophageal reflux disease)    History of kidney stones    Hyperlipemia    Hypertension    Morbidly obese (HCC)    BMI 50   OSA on CPAP 03/2020   Diagnosed April-May 2021   Paroxysmal atrial fibrillation (HCC) 01/2018   Persistent paroxysmal A. fib; s/p facilitated DCCV while on Tikosyn  (May 16, 2020)   Seasonal allergies    Wears glasses     Past Surgical History:  Procedure Laterality Date   CARDIOVERSION N/A 04/17/2020   Procedure: CARDIOVERSION;  Surgeon:  Barbaraann Darryle Ned, MD;  Location: [facility] ENDOSCOPY;  Service: Cardiovascular;  Laterality: N/A;   CARDIOVERSION N/A 05/16/2020   Procedure: CARDIOVERSION;  Surgeon: Kate Lonni CROME, MD;  Location: [facility] ENDOSCOPY;  Service: Cardiovascular;  Laterality: N/A;   CARPAL TUNNEL RELEASE     right and left   COLONOSCOPY     CYST EXCISION     face   EAR CYST EXCISION Left 02/11/2015   Procedure: EXCISION OF LEFT POST AURICULAR CYST;  Surgeon: Ida Loader, MD;  Location: [facility];  Service: ENT;  Laterality: Left;   INGUINAL HERNIA REPAIR     bilat-as infant   NASAL SINUS  SURGERY  2000    Current Outpatient Medications  Medication Sig Dispense Refill   acetaminophen  (TYLENOL ) 500 MG tablet Take 1,000 mg by mouth every 6 (six) hours as needed for moderate pain (pain score 4-6).     azelastine (ASTELIN) 0.1 % nasal spray Place 1 spray into both nostrils as needed for rhinitis or allergies.     calcium carbonate (TUMS - DOSED IN MG ELEMENTAL CALCIUM) 500 MG chewable tablet Chew 1 tablet by mouth as needed for indigestion or heartburn.     calcium gluconate 500 MG tablet Take 1 tablet by mouth daily. Calcium, Magnesium , zinc  and Vitamin D in it.     Cholecalciferol (VITAMIN D3 PO) Take 2,000 Units by mouth every morning.     dofetilide  (TIKOSYN ) 500 MCG capsule Take 1 capsule (500 mcg total) by mouth 2 (two) times daily. 180 capsule 3   ezetimibe  (ZETIA ) 10 MG tablet Take 10 mg by mouth at bedtime.      finasteride  (PROSCAR ) 5 MG tablet Take 5 mg by mouth daily.     metoprolol  succinate (TOPROL -XL) 50 MG 24 hr tablet Take 1.5 tablets (75 mg total) by mouth daily. Take with or immediately following a meal. 135 tablet 3   metoprolol  tartrate (LOPRESSOR ) 25 MG tablet Take 1 tablet (25 mg total) by mouth every 6 (six) hours as needed (PALPITATIONS). 90 tablet 1   Multiple Vitamins-Minerals (MENS MULTIVITAMIN) TABS Take 1 tablet by mouth daily.     omeprazole (PRILOSEC) 40 MG capsule Take 40 mg by mouth 4 (four) times a week.     potassium chloride  (KLOR-CON ) 10 MEQ tablet Take 1 tablet (10 mEq total) by mouth daily. 90 tablet 3   simvastatin  (ZOCOR ) 40 MG tablet Take 40 mg by mouth at bedtime.     valsartan  (DIOVAN ) 160 MG tablet Take 1 tablet (160 mg total) by mouth daily. 90 tablet 3   No current facility-administered medications for this visit.    Allergies:   Lisinopril    Social History:  The patient  reports that he has never smoked. He has never used smokeless tobacco. He reports that he does not drink alcohol and does not use drugs.   Family History:  The  patient's family history is not on file.  ROS:  Please see the history of present illness.    All other systems are reviewed and otherwise negative.   PHYSICAL EXAM:  VS:  There were no vitals taken for this visit. BMI: There is no height or weight on file to calculate BMI. Well nourished, well developed, in no acute distress HEENT: normocephalic, atraumatic Neck: no JVD, carotid bruits or masses Cardiac:  *** RRR; no significant murmurs, no rubs, or gallops Lungs: *** CTA b/l, no wheezing, rhonchi or rales Abd: soft, nontender, obese MS: no deformity or atrophy Ext: ***  trace-1+ edema (reported as chrionic Skin: warm and dry, no rash Neuro:  No gross deficits appreciated Psych: euthymic mood, full affect   EKG:  Done today and reviewed by myself shows  ***  08/10/24: SB/ vs low atrial rhythm, 59bpm, unchanged from last QTc 12/09/23:   SR 61bpm, QTc 07/29/23: SB 52, 1st degree AVblock , QTc   02/20/2020: TTE  1. Left ventricular ejection fraction, by estimation, is 55 to 60%. The  left ventricle has normal function. The left ventricle has no regional  wall motion abnormalities. There is moderate left ventricular hypertrophy.  Left ventricular diastolic  parameters are indeterminate.   2. Right ventricular systolic function is normal. The right ventricular  size is normal. There is mildly elevated pulmonary artery systolic  pressure.   3. Left atrial size was severely dilated.   4. Right atrial size was moderately dilated.   5. The mitral valve is normal in structure. Trivial mitral valve  regurgitation. No evidence of mitral stenosis.   6. The aortic valve is tricuspid. Aortic valve regurgitation is not  visualized. No aortic stenosis is present.   7. The inferior vena cava is dilated in size with >50% respiratory  variability, suggesting right atrial pressure of 8 mmHg.    Recent Labs: 08/10/2024: Magnesium  2.1 09/01/2024: BUN 18; Creatinine, Ser  0.98; Hemoglobin 12.9; Platelets 168; Potassium 4.5; Sodium 143  No results found for requested labs within last 365 days.   CrCl cannot be calculated (Patient's most recent lab result is older than the maximum 21 days allowed.).   Wt Readings from Last 3 Encounters:  11/10/24 (!) 410 lb (186 kg)  09/04/24 (!) 392 lb 6.7 oz (178 kg)  09/01/24 (!) 393 lb (178.3 kg)     Other studies reviewed: Additional studies/records reviewed today include: summarized above  ASSESSMENT AND PLAN:  Persistent AFib CHA2DS2Vasc is 2, on Xarelto  Tikosyn  w/*** stable QTc *** meds reviewed *** No/low burden by symptoms/home HRs *** Labs today  *** He is not currently involved in or looking towards any medical trials, reminded importance of any new or investigational medications that he clear it   HTN *** C/w his PMD  Secondary hypercoagulable state     Disposition:  F/u with us  again in *** 4 mo, sooner if needed  Current medicines are reviewed at length with the patient today.  The patient did not have any concerns regarding medicines.  Bonney Charlies Arthur, PA-C 12/12/2024 1:27 PM     [facility] [address] [place] [address] [phone] (office)  [phone] (fax)   "

## 2024-12-14 ENCOUNTER — Other Ambulatory Visit: Payer: Self-pay | Admitting: *Deleted

## 2024-12-14 ENCOUNTER — Ambulatory Visit: Attending: Physician Assistant | Admitting: Physician Assistant

## 2024-12-14 VITALS — BP 144/84 | Ht 72.0 in | Wt >= 6400 oz

## 2024-12-14 DIAGNOSIS — D6869 Other thrombophilia: Secondary | ICD-10-CM | POA: Diagnosis not present

## 2024-12-14 DIAGNOSIS — I4819 Other persistent atrial fibrillation: Secondary | ICD-10-CM | POA: Insufficient documentation

## 2024-12-14 DIAGNOSIS — Z5181 Encounter for therapeutic drug level monitoring: Secondary | ICD-10-CM | POA: Diagnosis present

## 2024-12-14 DIAGNOSIS — Z79899 Other long term (current) drug therapy: Secondary | ICD-10-CM | POA: Diagnosis not present

## 2024-12-14 DIAGNOSIS — R609 Edema, unspecified: Secondary | ICD-10-CM | POA: Diagnosis not present

## 2024-12-14 DIAGNOSIS — I10 Essential (primary) hypertension: Secondary | ICD-10-CM | POA: Diagnosis not present

## 2024-12-14 LAB — BASIC METABOLIC PANEL WITH GFR
BUN/Creatinine Ratio: 14 (ref 10–24)
BUN: 15 mg/dL (ref 8–27)
CO2: 21 mmol/L (ref 20–29)
Calcium: 9.7 mg/dL (ref 8.6–10.2)
Chloride: 104 mmol/L (ref 96–106)
Creatinine, Ser: 1.07 mg/dL (ref 0.76–1.27)
Glucose: 98 mg/dL (ref 70–99)
Potassium: 4.7 mmol/L (ref 3.5–5.2)
Sodium: 141 mmol/L (ref 134–144)
eGFR: 74 mL/min/1.73

## 2024-12-14 LAB — MAGNESIUM: Magnesium: 2.1 mg/dL (ref 1.6–2.3)

## 2024-12-14 MED ORDER — RIVAROXABAN 20 MG PO TABS: 20.0000 mg | ORAL_TABLET | Freq: Every day | ORAL | 5 refills | Status: AC

## 2024-12-14 NOTE — Telephone Encounter (Signed)
 Prescription refill request for Xarelto  received.  Indication: afib  Last office visit:Harding  11/11/2024, [place] 12/14/2024 Weight: 184 kg  Age: 74 yo  Scr: 0.99, 08/10/2024 CrCl:  176 ml/min   Refill sent.

## 2024-12-14 NOTE — Patient Instructions (Addendum)
 Medication Instructions:   Your physician recommends that you continue on your current medications as directed. Please refer to the Current Medication list given to you today.  *If you need a refill on your cardiac medications before your next appointment, please call your pharmacy*   Lab Work:   PLEASE GO DOWN STAIRS  [facility]  FIRST FLOOR   ( GET OFF ELEVATORS WALK TOWARDS WAITING AREA LAB LOCATED BY PHARMACY):  BMET AND MAG TODAY       If you have labs (blood work) drawn today and your tests are completely normal, you will receive your results only by: MyChart Message (if you have MyChart) OR A paper copy in the mail If you have any lab test that is abnormal or we need to change your treatment, we will call you to review the results.   Testing/Procedures: NONE ORDERED  TODAY    Follow-Up: At [facility], you and your health needs are our priority.  As part of our continuing mission to provide you with exceptional heart care, our providers are all part of one team.  This team includes your primary Cardiologist (physician) and Advanced Practice Providers or APPs (Physician Assistants and Nurse Practitioners) who all work together to provide you with the care you need, when you need it.  Your next appointment:  4 month(s)  Provider:   Fonda Kitty, MD  ( CONTACT  CASSIE HALL/ ANGELINE HAMMER FOR EP SCHEDULING ISSUES )   We recommend signing up for the patient portal called MyChart.  Sign up information is provided on this After Visit Summary.  MyChart is used to connect with patients for Virtual Visits (Telemedicine).  Patients are able to view lab/test results, encounter notes, upcoming appointments, etc.  Non-urgent messages can be sent to your provider as well.   To learn more about what you can do with MyChart, go to [email or web address].   Other Instructions

## 2024-12-15 ENCOUNTER — Ambulatory Visit: Payer: Self-pay | Admitting: Physician Assistant

## 2024-12-29 ENCOUNTER — Encounter: Payer: Self-pay | Admitting: Podiatry

## 2024-12-29 ENCOUNTER — Ambulatory Visit (INDEPENDENT_AMBULATORY_CARE_PROVIDER_SITE_OTHER): Admitting: Podiatry

## 2024-12-29 DIAGNOSIS — M79675 Pain in left toe(s): Secondary | ICD-10-CM | POA: Diagnosis not present

## 2024-12-29 DIAGNOSIS — M79674 Pain in right toe(s): Secondary | ICD-10-CM | POA: Diagnosis not present

## 2024-12-29 DIAGNOSIS — B351 Tinea unguium: Secondary | ICD-10-CM

## 2024-12-29 DIAGNOSIS — D689 Coagulation defect, unspecified: Secondary | ICD-10-CM

## 2024-12-29 NOTE — Progress Notes (Signed)
 This patient returns to my office for at risk foot care.  This patient requires this care by a professional since this patient will be at risk due to having coagulation defect due to taking xarelto .  This patient is unable to cut nails himself since the patient cannot reach his nails.These nails are painful walking and wearing shoes. Patient also has fissures on both heels due to his callus. This patient presents for at risk foot care today.  General Appearance  Alert, conversant and in no acute stress.  Vascular  Dorsalis pedis and posterior tibial  pulses are weakly  palpable  bilaterally.  Capillary return is within normal limits  bilaterally. Temperature is within normal limits  bilaterally.  Neurologic  Senn-Weinstein monofilament wire test within normal limits  bilaterally. Muscle power within normal limits bilaterally.  Nails Thick disfigured discolored nails with subungual debris  from hallux to fifth toes bilaterally. No evidence of bacterial infection or drainage bilaterally.  Orthopedic  No limitations of motion  feet .  No crepitus or effusions noted.  No bony pathology or digital deformities noted.  Skin  normotropic skin with no porokeratosis noted bilaterally.  No signs of infections or ulcers noted.     Onychomycosis  Pain in right toes  Pain in left toes   Consent was obtained for treatment procedures.   Mechanical debridement of nails 1-5  bilaterally performed with a nail nipper.  Filed with dremel without incident.    Return office visit    3 months                  Told patient to return for periodic foot care and evaluation due to potential at risk complications.   Ruffin Cotton DPM

## 2025-03-30 ENCOUNTER — Ambulatory Visit: Admitting: Podiatry

## 2025-04-05 ENCOUNTER — Ambulatory Visit: Admitting: Cardiology
